# Patient Record
Sex: Male | Born: 1950 | Race: White | Hispanic: No | Smoking: Former smoker
Health system: Southern US, Community
[De-identification: ages and names within clinical notes are randomized; demographics above are authoritative.]

## PROBLEM LIST (undated history)

## (undated) DIAGNOSIS — I4510 Unspecified right bundle-branch block: Secondary | ICD-10-CM

## (undated) DIAGNOSIS — Z951 Presence of aortocoronary bypass graft: Secondary | ICD-10-CM

## (undated) DIAGNOSIS — K449 Diaphragmatic hernia without obstruction or gangrene: Secondary | ICD-10-CM

## (undated) DIAGNOSIS — E119 Type 2 diabetes mellitus without complications: Secondary | ICD-10-CM

## (undated) DIAGNOSIS — E785 Hyperlipidemia, unspecified: Secondary | ICD-10-CM

## (undated) DIAGNOSIS — I5021 Acute systolic (congestive) heart failure: Secondary | ICD-10-CM

## (undated) DIAGNOSIS — R55 Syncope and collapse: Secondary | ICD-10-CM

## (undated) DIAGNOSIS — I480 Paroxysmal atrial fibrillation: Secondary | ICD-10-CM

## (undated) DIAGNOSIS — I2510 Atherosclerotic heart disease of native coronary artery without angina pectoris: Secondary | ICD-10-CM

## (undated) DIAGNOSIS — I10 Essential (primary) hypertension: Secondary | ICD-10-CM

## (undated) HISTORY — DX: Acute systolic (congestive) heart failure: I50.21

## (undated) HISTORY — PX: VASECTOMY: SHX75

## (undated) HISTORY — DX: Type 2 diabetes mellitus without complications: E11.9

## (undated) HISTORY — PX: CARPAL TUNNEL RELEASE: SHX101

## (undated) HISTORY — DX: Syncope and collapse: R55

## (undated) HISTORY — DX: Unspecified right bundle-branch block: I45.10

## (undated) HISTORY — DX: Diaphragmatic hernia without obstruction or gangrene: K44.9

## (undated) HISTORY — DX: Presence of aortocoronary bypass graft: Z95.1

## (undated) HISTORY — DX: Atherosclerotic heart disease of native coronary artery without angina pectoris: I25.10

## (undated) HISTORY — PX: CARDIAC CATHETERIZATION: SHX172

## (undated) HISTORY — DX: Hyperlipidemia, unspecified: E78.5

## (undated) HISTORY — DX: Paroxysmal atrial fibrillation: I48.0

---

## 2010-12-08 ENCOUNTER — Encounter (HOSPITAL_BASED_OUTPATIENT_CLINIC_OR_DEPARTMENT_OTHER)
Admission: RE | Admit: 2010-12-08 | Discharge: 2010-12-08 | Disposition: A | Discharge status: Home / Self Care | Payer: BC Managed Care – PPO | Source: Ambulatory Visit | Attending: Orthopedic Surgery | Admitting: Orthopedic Surgery

## 2010-12-08 LAB — BASIC METABOLIC PANEL
BUN: 10 mg/dL (ref 6–23)
CO2: 24 mEq/L (ref 19–32)
Calcium: 10.2 mg/dL (ref 8.4–10.5)
GFR calc non Af Amer: >90 mL/min (ref >90)
Glucose, Bld: 191 mg/dL — ABNORMAL HIGH (ref 70–99)

## 2010-12-12 ENCOUNTER — Ambulatory Visit (HOSPITAL_BASED_OUTPATIENT_CLINIC_OR_DEPARTMENT_OTHER)
Admission: RE | Admit: 2010-12-12 | Discharge: 2010-12-12 | Disposition: A | Discharge status: Home / Self Care | Payer: BC Managed Care – PPO | Source: Ambulatory Visit | Attending: Orthopedic Surgery | Admitting: Orthopedic Surgery

## 2010-12-12 DIAGNOSIS — G5600 Carpal tunnel syndrome, unspecified upper limb: Secondary | ICD-10-CM | POA: Insufficient documentation

## 2010-12-12 DIAGNOSIS — I10 Essential (primary) hypertension: Secondary | ICD-10-CM | POA: Insufficient documentation

## 2010-12-12 DIAGNOSIS — G5620 Lesion of ulnar nerve, unspecified upper limb: Secondary | ICD-10-CM | POA: Insufficient documentation

## 2010-12-12 DIAGNOSIS — E119 Type 2 diabetes mellitus without complications: Secondary | ICD-10-CM | POA: Insufficient documentation

## 2010-12-12 DIAGNOSIS — Z01812 Encounter for preprocedural laboratory examination: Secondary | ICD-10-CM | POA: Insufficient documentation

## 2010-12-12 LAB — POCT HEMOGLOBIN-HEMACUE: Hemoglobin: 13.7 g/dL (ref 13.0–17.0)

## 2010-12-22 NOTE — Op Note (Signed)
NAME:  Nicholas Mills, DEUTSCHMAN NO.:  [id number]  MEDICAL RECORD NO.:  [id number]  LOCATION:                                 FACILITY:  PHYSICIAN:  Cindee Salt, M.D.            DATE OF BIRTH:  DATE OF PROCEDURE:  12/12/2010 DATE OF DISCHARGE:                              OPERATIVE REPORT   PREOPERATIVE DIAGNOSIS:  Carpal tunnel syndrome, left hand; cubital tunnel syndrome, left elbow.  POSTOPERATIVE DIAGNOSIS:  Carpal tunnel syndrome, left hand; cubital tunnel syndrome, left elbow.  OPERATION:  Decompression median nerve at the left wrist, decompression ulnar nerve, left elbow.  SURGEON:  Cindee Salt, MD  ASSISTANT:  Betha Loa, MD  ANESTHESIA:  General.  ANESTHESIOLOGIST:  Burna Forts, MD  HISTORY:  The patient is a 60 year old male with a history of carpal tunnel syndrome, EMG nerve conductions positive along with cubital tunnel syndrome.  The nerve conductions also positive.  He was elected to undergo surgical decompression.  Pre, peri, and postoperative course have been discussed along with risks and complications.  He is aware that there is no guarantee with the surgery, possibility of infection, recurrence, injury to arteries, nerves, tendons, incomplete relief of symptoms, dystrophy.  In the preoperative area, the patient was seen, the extremity marked by both the patient and surgeon.  Antibiotic given.  PROCEDURE IN DETAIL:  The patient was brought to the operating room where a general anesthetic was carried out without difficulty.  He was prepped using ChloraPrep, supine position with the left arm free.  A 3- minute dry time was allowed.  Time-out taken, confirming the patient procedure.  Following adequate anesthesia, a longitudinal incision was made in the palm, carried down through subcutaneous tissue.  Bleeders were electrocauterized.  Palmar fascia was split.  Superficial palmar arch identified.  The flexor tendon to the ring little finger  identified to the ulnar side of the median nerve.  The carpal retinaculum was incised with sharp dissection.  A right-angle and Sewall retractor were placed between skin and forearm fascia.  The fascia released for approximately a cm and half proximal to the wrist crease under direct vision.  Canal was explored.  No further lesions were identified.  The wound was irrigated and closed with interrupted 4-0 Vicryl repeat sutures and separate incision was then made longitudinally over the medial epicondyle approximately 2.5 cm in length carried down through subcutaneous tissue.  Sensory nerves were identified and protected. Retractors placed.  Osborne fascia was incised in the posterior aspect. This allowed the flap to be rotated anteriorly.  A subcutaneous tissue was dissected free from the flexor carpi ulnaris.  A fasciotomy performed after placement of 2 knee retractors.  This the muscle was then split.  The deep fascia was noted.  A KMI guide for carpal tunnel release was then passed between the nerve.  The retractors re-positioned opening the muscle.  A fasciotomy deep was then performed with angled ENT scissors for approximately 6 cm.  No further lesions were identified.  The nerve was noted to be intact.  A dissection was then carried proximally.  The subcutaneous tissue dissected free from the  underlying fascia.  The nerve was identified.  The retractor was placed and the proximal fasciotomy performed to the arcade of Str[place].  The elbow was fully flexed, no subluxation to the nerve was noted.  The wound was copiously irrigated with saline.  The Osborne fascia was then repaired to the posterior skin flap with figure-of-eight 4-0 Vicryl sutures.  Subcutaneous tissue was closed with interrupted 4-0 Vicryl. The skin was then closed with a subcuticular 4-0 Vicryl repeat suture, this was done in a subcuticular manner.  The wounds were each irrigated with 0.25% Marcaine approximately 8 mL  was used.  A sterile compressive dressing, long-arm splint with the elbow flexed approximately 30 degrees was then applied.  The patient tolerated the procedure well.  On deflation of the tourniquet, all fingers immediately pinked.  He was taken to the recovery room for observation in satisfactory condition. He will be discharged home to return to the [facility] in 1 week on Vicodin.          ______________________________ Cindee Salt, M.D.     GK/MEDQ  D:  12/12/2010  T:  12/12/2010  Job:  [id number]  Electronically Signed by Cindee Salt M.D. on 12/22/2010 03:46:32 PM

## 2015-03-24 DIAGNOSIS — E119 Type 2 diabetes mellitus without complications: Secondary | ICD-10-CM | POA: Diagnosis not present

## 2015-03-24 DIAGNOSIS — Z125 Encounter for screening for malignant neoplasm of prostate: Secondary | ICD-10-CM | POA: Diagnosis not present

## 2015-03-24 DIAGNOSIS — E782 Mixed hyperlipidemia: Secondary | ICD-10-CM | POA: Diagnosis not present

## 2015-03-24 DIAGNOSIS — Z79899 Other long term (current) drug therapy: Secondary | ICD-10-CM | POA: Diagnosis not present

## 2015-03-28 DIAGNOSIS — E1165 Type 2 diabetes mellitus with hyperglycemia: Secondary | ICD-10-CM | POA: Diagnosis not present

## 2015-03-28 DIAGNOSIS — Z760 Encounter for issue of repeat prescription: Secondary | ICD-10-CM | POA: Diagnosis not present

## 2015-06-28 DIAGNOSIS — E1165 Type 2 diabetes mellitus with hyperglycemia: Secondary | ICD-10-CM | POA: Diagnosis not present

## 2015-07-06 DIAGNOSIS — R0989 Other specified symptoms and signs involving the circulatory and respiratory systems: Secondary | ICD-10-CM | POA: Diagnosis not present

## 2015-07-06 DIAGNOSIS — E1165 Type 2 diabetes mellitus with hyperglycemia: Secondary | ICD-10-CM | POA: Diagnosis not present

## 2015-07-06 DIAGNOSIS — Z0000 Encounter for general adult medical examination without abnormal findings: Secondary | ICD-10-CM | POA: Diagnosis not present

## 2015-10-10 DIAGNOSIS — Z9181 History of falling: Secondary | ICD-10-CM | POA: Diagnosis not present

## 2015-10-10 DIAGNOSIS — E1165 Type 2 diabetes mellitus with hyperglycemia: Secondary | ICD-10-CM | POA: Diagnosis not present

## 2015-10-10 DIAGNOSIS — Z1389 Encounter for screening for other disorder: Secondary | ICD-10-CM | POA: Diagnosis not present

## 2015-10-10 DIAGNOSIS — Z0000 Encounter for general adult medical examination without abnormal findings: Secondary | ICD-10-CM | POA: Diagnosis not present

## 2016-04-10 DIAGNOSIS — E1165 Type 2 diabetes mellitus with hyperglycemia: Secondary | ICD-10-CM | POA: Diagnosis not present

## 2016-04-11 DIAGNOSIS — E119 Type 2 diabetes mellitus without complications: Secondary | ICD-10-CM | POA: Diagnosis not present

## 2016-04-17 DIAGNOSIS — N529 Male erectile dysfunction, unspecified: Secondary | ICD-10-CM | POA: Diagnosis not present

## 2016-06-04 DIAGNOSIS — Z6824 Body mass index (BMI) 24.0-24.9, adult: Secondary | ICD-10-CM | POA: Diagnosis not present

## 2016-06-04 DIAGNOSIS — R51 Headache: Secondary | ICD-10-CM | POA: Diagnosis not present

## 2016-06-04 DIAGNOSIS — W57XXXA Bitten or stung by nonvenomous insect and other nonvenomous arthropods, initial encounter: Secondary | ICD-10-CM | POA: Diagnosis not present

## 2016-07-30 DIAGNOSIS — A938 Other specified arthropod-borne viral fevers: Secondary | ICD-10-CM | POA: Diagnosis not present

## 2017-01-09 DIAGNOSIS — E119 Type 2 diabetes mellitus without complications: Secondary | ICD-10-CM | POA: Diagnosis not present

## 2017-01-16 DIAGNOSIS — G4700 Insomnia, unspecified: Secondary | ICD-10-CM | POA: Diagnosis not present

## 2017-01-16 DIAGNOSIS — Z0000 Encounter for general adult medical examination without abnormal findings: Secondary | ICD-10-CM | POA: Diagnosis not present

## 2017-01-16 DIAGNOSIS — E1165 Type 2 diabetes mellitus with hyperglycemia: Secondary | ICD-10-CM | POA: Diagnosis not present

## 2017-01-16 DIAGNOSIS — Z23 Encounter for immunization: Secondary | ICD-10-CM | POA: Diagnosis not present

## 2017-04-16 DIAGNOSIS — Z79899 Other long term (current) drug therapy: Secondary | ICD-10-CM | POA: Diagnosis not present

## 2017-04-16 DIAGNOSIS — J449 Chronic obstructive pulmonary disease, unspecified: Secondary | ICD-10-CM | POA: Diagnosis not present

## 2017-04-16 DIAGNOSIS — E1165 Type 2 diabetes mellitus with hyperglycemia: Secondary | ICD-10-CM | POA: Diagnosis not present

## 2017-04-16 DIAGNOSIS — Z1331 Encounter for screening for depression: Secondary | ICD-10-CM | POA: Diagnosis not present

## 2017-04-16 DIAGNOSIS — Z125 Encounter for screening for malignant neoplasm of prostate: Secondary | ICD-10-CM | POA: Diagnosis not present

## 2017-04-16 DIAGNOSIS — R252 Cramp and spasm: Secondary | ICD-10-CM | POA: Diagnosis not present

## 2017-04-22 DIAGNOSIS — E119 Type 2 diabetes mellitus without complications: Secondary | ICD-10-CM | POA: Diagnosis not present

## 2017-10-25 DIAGNOSIS — Z79899 Other long term (current) drug therapy: Secondary | ICD-10-CM | POA: Diagnosis not present

## 2017-10-25 DIAGNOSIS — E1165 Type 2 diabetes mellitus with hyperglycemia: Secondary | ICD-10-CM | POA: Diagnosis not present

## 2017-10-25 DIAGNOSIS — Z9181 History of falling: Secondary | ICD-10-CM | POA: Diagnosis not present

## 2017-10-25 DIAGNOSIS — Z1339 Encounter for screening examination for other mental health and behavioral disorders: Secondary | ICD-10-CM | POA: Diagnosis not present

## 2018-01-01 DIAGNOSIS — Z23 Encounter for immunization: Secondary | ICD-10-CM | POA: Diagnosis not present

## 2018-07-02 ENCOUNTER — Encounter: Payer: Self-pay | Admitting: Cardiology

## 2018-07-02 DIAGNOSIS — Z0000 Encounter for general adult medical examination without abnormal findings: Secondary | ICD-10-CM | POA: Diagnosis not present

## 2018-07-02 DIAGNOSIS — R55 Syncope and collapse: Secondary | ICD-10-CM | POA: Diagnosis not present

## 2018-07-02 DIAGNOSIS — E871 Hypo-osmolality and hyponatremia: Secondary | ICD-10-CM | POA: Diagnosis not present

## 2018-07-02 DIAGNOSIS — Z79899 Other long term (current) drug therapy: Secondary | ICD-10-CM | POA: Diagnosis not present

## 2018-07-02 DIAGNOSIS — E785 Hyperlipidemia, unspecified: Secondary | ICD-10-CM | POA: Diagnosis not present

## 2018-07-02 DIAGNOSIS — E1169 Type 2 diabetes mellitus with other specified complication: Secondary | ICD-10-CM | POA: Diagnosis not present

## 2018-07-10 ENCOUNTER — Encounter: Payer: Self-pay | Admitting: Cardiology

## 2018-07-10 DIAGNOSIS — R55 Syncope and collapse: Secondary | ICD-10-CM | POA: Diagnosis not present

## 2018-07-10 DIAGNOSIS — I2129 ST elevation (STEMI) myocardial infarction involving other sites: Secondary | ICD-10-CM | POA: Diagnosis not present

## 2018-07-10 DIAGNOSIS — I4510 Unspecified right bundle-branch block: Secondary | ICD-10-CM | POA: Diagnosis not present

## 2018-07-10 DIAGNOSIS — I21A1 Myocardial infarction type 2: Secondary | ICD-10-CM | POA: Diagnosis not present

## 2018-07-11 ENCOUNTER — Other Ambulatory Visit: Payer: Self-pay

## 2018-07-11 ENCOUNTER — Ambulatory Visit (INDEPENDENT_AMBULATORY_CARE_PROVIDER_SITE_OTHER): Payer: Medicare Other | Admitting: Cardiology

## 2018-07-11 ENCOUNTER — Encounter: Payer: Medicare Other | Admitting: Cardiology

## 2018-07-11 ENCOUNTER — Telehealth: Payer: Self-pay | Admitting: Family

## 2018-07-11 ENCOUNTER — Ambulatory Visit: Payer: BC Managed Care – PPO | Admitting: Cardiology

## 2018-07-11 ENCOUNTER — Emergency Department (HOSPITAL_COMMUNITY): Payer: Medicare Other

## 2018-07-11 ENCOUNTER — Encounter: Payer: Self-pay | Admitting: Cardiology

## 2018-07-11 ENCOUNTER — Encounter (HOSPITAL_COMMUNITY): Payer: Self-pay | Admitting: Emergency Medicine

## 2018-07-11 ENCOUNTER — Inpatient Hospital Stay (HOSPITAL_COMMUNITY)
Admission: EM | Admit: 2018-07-11 | Discharge: 2018-07-23 | DRG: 233 | Disposition: A | Discharge status: Home / Self Care | Payer: Medicare Other | Attending: Surgery | Admitting: Surgery

## 2018-07-11 VITALS — BP 110/62 | HR 90 | Temp 98.7°F | Ht 70.0 in | Wt 171.1 lb

## 2018-07-11 DIAGNOSIS — I452 Bifascicular block: Secondary | ICD-10-CM | POA: Diagnosis present

## 2018-07-11 DIAGNOSIS — Z1159 Encounter for screening for other viral diseases: Secondary | ICD-10-CM

## 2018-07-11 DIAGNOSIS — R9439 Abnormal result of other cardiovascular function study: Secondary | ICD-10-CM | POA: Insufficient documentation

## 2018-07-11 DIAGNOSIS — J90 Pleural effusion, not elsewhere classified: Secondary | ICD-10-CM | POA: Diagnosis not present

## 2018-07-11 DIAGNOSIS — R55 Syncope and collapse: Secondary | ICD-10-CM

## 2018-07-11 DIAGNOSIS — I4891 Unspecified atrial fibrillation: Secondary | ICD-10-CM | POA: Diagnosis not present

## 2018-07-11 DIAGNOSIS — Z79899 Other long term (current) drug therapy: Secondary | ICD-10-CM

## 2018-07-11 DIAGNOSIS — I252 Old myocardial infarction: Secondary | ICD-10-CM | POA: Diagnosis not present

## 2018-07-11 DIAGNOSIS — K219 Gastro-esophageal reflux disease without esophagitis: Secondary | ICD-10-CM | POA: Diagnosis present

## 2018-07-11 DIAGNOSIS — Z8249 Family history of ischemic heart disease and other diseases of the circulatory system: Secondary | ICD-10-CM

## 2018-07-11 DIAGNOSIS — L03116 Cellulitis of left lower limb: Secondary | ICD-10-CM | POA: Diagnosis present

## 2018-07-11 DIAGNOSIS — I214 Non-ST elevation (NSTEMI) myocardial infarction: Secondary | ICD-10-CM

## 2018-07-11 DIAGNOSIS — I4510 Unspecified right bundle-branch block: Secondary | ICD-10-CM

## 2018-07-11 DIAGNOSIS — R079 Chest pain, unspecified: Secondary | ICD-10-CM | POA: Diagnosis not present

## 2018-07-11 DIAGNOSIS — N181 Chronic kidney disease, stage 1: Secondary | ICD-10-CM | POA: Diagnosis present

## 2018-07-11 DIAGNOSIS — I272 Pulmonary hypertension, unspecified: Secondary | ICD-10-CM | POA: Diagnosis present

## 2018-07-11 DIAGNOSIS — E1169 Type 2 diabetes mellitus with other specified complication: Secondary | ICD-10-CM | POA: Diagnosis present

## 2018-07-11 DIAGNOSIS — I5021 Acute systolic (congestive) heart failure: Secondary | ICD-10-CM | POA: Diagnosis present

## 2018-07-11 DIAGNOSIS — I340 Nonrheumatic mitral (valve) insufficiency: Secondary | ICD-10-CM | POA: Diagnosis not present

## 2018-07-11 DIAGNOSIS — Z7982 Long term (current) use of aspirin: Secondary | ICD-10-CM | POA: Diagnosis not present

## 2018-07-11 DIAGNOSIS — I25110 Atherosclerotic heart disease of native coronary artery with unstable angina pectoris: Secondary | ICD-10-CM | POA: Diagnosis not present

## 2018-07-11 DIAGNOSIS — I509 Heart failure, unspecified: Secondary | ICD-10-CM | POA: Diagnosis not present

## 2018-07-11 DIAGNOSIS — D62 Acute posthemorrhagic anemia: Secondary | ICD-10-CM | POA: Diagnosis not present

## 2018-07-11 DIAGNOSIS — I429 Cardiomyopathy, unspecified: Secondary | ICD-10-CM

## 2018-07-11 DIAGNOSIS — Z7984 Long term (current) use of oral hypoglycemic drugs: Secondary | ICD-10-CM

## 2018-07-11 DIAGNOSIS — I255 Ischemic cardiomyopathy: Secondary | ICD-10-CM | POA: Diagnosis not present

## 2018-07-11 DIAGNOSIS — Z01810 Encounter for preprocedural cardiovascular examination: Secondary | ICD-10-CM | POA: Diagnosis not present

## 2018-07-11 DIAGNOSIS — Z01812 Encounter for preprocedural laboratory examination: Secondary | ICD-10-CM

## 2018-07-11 DIAGNOSIS — E119 Type 2 diabetes mellitus without complications: Secondary | ICD-10-CM

## 2018-07-11 DIAGNOSIS — E1165 Type 2 diabetes mellitus with hyperglycemia: Secondary | ICD-10-CM | POA: Diagnosis present

## 2018-07-11 DIAGNOSIS — I110 Hypertensive heart disease with heart failure: Secondary | ICD-10-CM | POA: Diagnosis not present

## 2018-07-11 DIAGNOSIS — I2510 Atherosclerotic heart disease of native coronary artery without angina pectoris: Secondary | ICD-10-CM | POA: Diagnosis not present

## 2018-07-11 DIAGNOSIS — I130 Hypertensive heart and chronic kidney disease with heart failure and stage 1 through stage 4 chronic kidney disease, or unspecified chronic kidney disease: Secondary | ICD-10-CM | POA: Diagnosis present

## 2018-07-11 DIAGNOSIS — Z87891 Personal history of nicotine dependence: Secondary | ICD-10-CM

## 2018-07-11 DIAGNOSIS — E1122 Type 2 diabetes mellitus with diabetic chronic kidney disease: Secondary | ICD-10-CM | POA: Diagnosis present

## 2018-07-11 DIAGNOSIS — E785 Hyperlipidemia, unspecified: Secondary | ICD-10-CM

## 2018-07-11 DIAGNOSIS — Z20828 Contact with and (suspected) exposure to other viral communicable diseases: Secondary | ICD-10-CM | POA: Diagnosis not present

## 2018-07-11 DIAGNOSIS — R918 Other nonspecific abnormal finding of lung field: Secondary | ICD-10-CM | POA: Diagnosis not present

## 2018-07-11 DIAGNOSIS — J9811 Atelectasis: Secondary | ICD-10-CM | POA: Diagnosis not present

## 2018-07-11 DIAGNOSIS — Z951 Presence of aortocoronary bypass graft: Secondary | ICD-10-CM

## 2018-07-11 DIAGNOSIS — T1490XA Injury, unspecified, initial encounter: Secondary | ICD-10-CM

## 2018-07-11 DIAGNOSIS — S99922A Unspecified injury of left foot, initial encounter: Secondary | ICD-10-CM | POA: Diagnosis not present

## 2018-07-11 DIAGNOSIS — K449 Diaphragmatic hernia without obstruction or gangrene: Secondary | ICD-10-CM | POA: Diagnosis present

## 2018-07-11 DIAGNOSIS — I517 Cardiomegaly: Secondary | ICD-10-CM | POA: Diagnosis not present

## 2018-07-11 DIAGNOSIS — I361 Nonrheumatic tricuspid (valve) insufficiency: Secondary | ICD-10-CM | POA: Diagnosis not present

## 2018-07-11 HISTORY — DX: Cardiomyopathy, unspecified: I42.9

## 2018-07-11 HISTORY — DX: Abnormal result of other cardiovascular function study: R94.39

## 2018-07-11 HISTORY — DX: Essential (primary) hypertension: I10

## 2018-07-11 HISTORY — DX: Unspecified right bundle-branch block: I45.10

## 2018-07-11 HISTORY — DX: Non-ST elevation (NSTEMI) myocardial infarction: I21.4

## 2018-07-11 HISTORY — DX: Type 2 diabetes mellitus with other specified complication: E11.69

## 2018-07-11 HISTORY — DX: Syncope and collapse: R55

## 2018-07-11 LAB — SARS CORONAVIRUS 2 BY RT PCR (HOSPITAL ORDER, PERFORMED IN ~~LOC~~ HOSPITAL LAB): SARS Coronavirus 2: NEGATIVE

## 2018-07-11 LAB — HEMOGLOBIN A1C
Hgb A1c MFr Bld: 8.1 % — ABNORMAL HIGH (ref 4.8–5.6)
Mean Plasma Glucose: 185.77 mg/dL

## 2018-07-11 LAB — BASIC METABOLIC PANEL
Anion gap: 14 (ref 5–15)
BUN: 27 mg/dL — ABNORMAL HIGH (ref 8–23)
CO2: 23 mmol/L (ref 22–32)
Calcium: 12.2 mg/dL — ABNORMAL HIGH (ref 8.9–10.3)
Chloride: 97 mmol/L — ABNORMAL LOW (ref 98–111)
Creatinine, Ser: 1.48 mg/dL — ABNORMAL HIGH (ref 0.61–1.24)
GFR calc Af Amer: 55 mL/min — ABNORMAL LOW (ref >60)
GFR calc non Af Amer: 48 mL/min — ABNORMAL LOW (ref >60)
Glucose, Bld: 140 mg/dL — ABNORMAL HIGH (ref 70–99)
Potassium: 4.1 mmol/L (ref 3.5–5.1)
Sodium: 134 mmol/L — ABNORMAL LOW (ref 135–145)

## 2018-07-11 LAB — CBC
HCT: 40.4 % (ref 39.0–52.0)
Hemoglobin: 14.2 g/dL (ref 13.0–17.0)
MCH: 33.1 pg (ref 26.0–34.0)
MCHC: 35.1 g/dL (ref 30.0–36.0)
MCV: 94.2 fL (ref 80.0–100.0)
Platelets: 211 10*3/uL (ref 150–400)
RBC: 4.29 MIL/uL (ref 4.22–5.81)
RDW: 12 % (ref 11.5–15.5)
WBC: 7.7 10*3/uL (ref 4.0–10.5)
nRBC: 0 % (ref 0.0–0.2)

## 2018-07-11 LAB — PROTIME-INR
INR: 1.2 (ref 0.8–1.2)
Prothrombin Time: 15.1 seconds (ref 11.4–15.2)

## 2018-07-11 LAB — GLUCOSE, CAPILLARY: Glucose-Capillary: 127 mg/dL — ABNORMAL HIGH (ref 70–99)

## 2018-07-11 LAB — TROPONIN I: Troponin I: 4.44 ng/mL (ref <0.03)

## 2018-07-11 LAB — APTT: aPTT: 77 seconds — ABNORMAL HIGH (ref 24–36)

## 2018-07-11 MED ORDER — HEPARIN (PORCINE) 25000 UT/250ML-% IV SOLN
1400.0000 [IU]/h | INTRAVENOUS | Status: DC
  Administered 2018-07-11: 950 [IU]/h via INTRAVENOUS
  Administered 2018-07-12 – 2018-07-14 (×3): 1300 [IU]/h via INTRAVENOUS
  Filled 2018-07-11 (×4): qty 250

## 2018-07-11 MED ORDER — SODIUM CHLORIDE 0.9% FLUSH
3.0000 mL | Freq: Once | INTRAVENOUS | Status: AC
  Administered 2018-07-11: 3 mL via INTRAVENOUS

## 2018-07-11 MED ORDER — ACETAMINOPHEN 325 MG PO TABS: 650.0000 mg | ORAL_TABLET | ORAL | Status: DC | PRN

## 2018-07-11 MED ORDER — NITROGLYCERIN 0.4 MG SL SUBL: 0.4000 mg | SUBLINGUAL_TABLET | SUBLINGUAL | Status: DC | PRN

## 2018-07-11 MED ORDER — ASPIRIN EC 81 MG PO TBEC: 81.0000 mg | DELAYED_RELEASE_TABLET | Freq: Every day | ORAL | Status: DC

## 2018-07-11 MED ORDER — ASPIRIN EC 81 MG PO TBEC
81.0000 mg | DELAYED_RELEASE_TABLET | Freq: Every day | ORAL | Status: DC
  Administered 2018-07-12 – 2018-07-17 (×5): 81 mg via ORAL
  Filled 2018-07-11 (×5): qty 1

## 2018-07-11 MED ORDER — METOPROLOL TARTRATE 25 MG PO TABS: 25.0000 mg | ORAL_TABLET | Freq: Two times a day (BID) | ORAL | 1 refills | Status: DC

## 2018-07-11 MED ORDER — INSULIN ASPART 100 UNIT/ML ~~LOC~~ SOLN
0.0000 [IU] | Freq: Three times a day (TID) | SUBCUTANEOUS | Status: DC
  Administered 2018-07-12 (×3): 3 [IU] via SUBCUTANEOUS
  Administered 2018-07-13: 8 [IU] via SUBCUTANEOUS
  Administered 2018-07-13: 3 [IU] via SUBCUTANEOUS
  Administered 2018-07-13: 5 [IU] via SUBCUTANEOUS
  Administered 2018-07-14: 11 [IU] via SUBCUTANEOUS
  Administered 2018-07-14: 2 [IU] via SUBCUTANEOUS
  Administered 2018-07-14 – 2018-07-15 (×2): 3 [IU] via SUBCUTANEOUS
  Administered 2018-07-15: 2 [IU] via SUBCUTANEOUS
  Administered 2018-07-15: 8 [IU] via SUBCUTANEOUS
  Administered 2018-07-16: 15 [IU] via SUBCUTANEOUS
  Administered 2018-07-16: 08:00:00 2 [IU] via SUBCUTANEOUS
  Administered 2018-07-16: 8 [IU] via SUBCUTANEOUS
  Administered 2018-07-17: 07:00:00 2 [IU] via SUBCUTANEOUS
  Administered 2018-07-17 (×2): 8 [IU] via SUBCUTANEOUS

## 2018-07-11 MED ORDER — ATORVASTATIN CALCIUM 80 MG PO TABS: 80.0000 mg | ORAL_TABLET | Freq: Every day | ORAL | Status: DC

## 2018-07-11 MED ORDER — METOPROLOL TARTRATE 25 MG PO TABS: 12.5000 mg | ORAL_TABLET | Freq: Two times a day (BID) | ORAL | Status: DC

## 2018-07-11 MED ORDER — INSULIN ASPART 100 UNIT/ML ~~LOC~~ SOLN
0.0000 [IU] | Freq: Every day | SUBCUTANEOUS | Status: DC
  Administered 2018-07-12: 2 [IU] via SUBCUTANEOUS
  Administered 2018-07-14 – 2018-07-17 (×3): 3 [IU] via SUBCUTANEOUS

## 2018-07-11 MED ORDER — HEPARIN BOLUS VIA INFUSION
4000.0000 [IU] | Freq: Once | INTRAVENOUS | Status: AC
  Administered 2018-07-11: 4000 [IU] via INTRAVENOUS
  Filled 2018-07-11: qty 4000

## 2018-07-11 MED ORDER — ONDANSETRON HCL 4 MG/2ML IJ SOLN: 4.0000 mg | Freq: Four times a day (QID) | INTRAMUSCULAR | Status: DC | PRN

## 2018-07-11 MED ORDER — ASPIRIN 81 MG PO CHEW
324.0000 mg | CHEWABLE_TABLET | Freq: Once | ORAL | Status: AC
  Administered 2018-07-11: 324 mg via ORAL
  Filled 2018-07-11: qty 4

## 2018-07-11 MED ORDER — ROSUVASTATIN CALCIUM 5 MG PO TABS
10.0000 mg | ORAL_TABLET | Freq: Every day | ORAL | Status: DC
  Administered 2018-07-12 – 2018-07-23 (×10): 10 mg via ORAL
  Filled 2018-07-11 (×10): qty 2

## 2018-07-11 MED ORDER — METOPROLOL TARTRATE 25 MG PO TABS
25.0000 mg | ORAL_TABLET | Freq: Two times a day (BID) | ORAL | Status: DC
  Administered 2018-07-11 – 2018-07-17 (×12): 25 mg via ORAL
  Filled 2018-07-11 (×12): qty 1

## 2018-07-11 MED ORDER — SODIUM CHLORIDE 0.9 % IV SOLN
Freq: Once | INTRAVENOUS | Status: AC
  Administered 2018-07-11: 1000 mL via INTRAVENOUS

## 2018-07-11 MED ORDER — KETOTIFEN FUMARATE 0.025 % OP SOLN
1.0000 [drp] | Freq: Two times a day (BID) | OPHTHALMIC | Status: DC | PRN
  Filled 2018-07-11: qty 5

## 2018-07-11 MED ORDER — ROSUVASTATIN CALCIUM 10 MG PO TABS: 10.0000 mg | ORAL_TABLET | Freq: Every day | ORAL | 1 refills | Status: DC

## 2018-07-11 MED ORDER — BOOST / RESOURCE BREEZE PO LIQD CUSTOM
1.0000 | Freq: Three times a day (TID) | ORAL | Status: DC
  Administered 2018-07-12 – 2018-07-15 (×9): 1 via ORAL
  Administered 2018-07-16: 22:00:00 via ORAL
  Administered 2018-07-16 – 2018-07-17 (×5): 1 via ORAL

## 2018-07-11 NOTE — ED Provider Notes (Signed)
[facility] EMERGENCY DEPARTMENT Provider Note   CSN: [id number] Arrival date & time: 07/11/18  1835    History   Chief Complaint Chief Complaint  Patient presents with  . Loss of Consciousness    HPI Nicholas Mills is a 68 y.o. male.     HPI   68 year old male with past medical history of hypertension, hyperlipidemia, diabetes, here with elevated troponin.  Patient had a syncopal episode on Monday.  He states that he was walking [place] to his barn when he experienced acute onset of lightheadedness, sweating, then syncopized.  He was on the ground for approximately 50 minutes.  He awoke in the cold pool of sweat.  Since then, he has noticed generalized fatigue and slight worsening of shortness of breath with exertion.  He denies any chest pain during the episode or since the episode.  He was seen at cardiology today and scheduled for heart catheterization on 5/26.  Lab work drawn which showed an elevated troponin and was subsequent told to come to the ER for evaluation.  Troponin was greater than 6.  He said no further chest pain.  Denies any current shortness of breath or sweating.  No nausea.  No leg swelling.  No history of blood clots.  No known history of coronary disease.  Symptoms are worse with exertion.  No alleviating factors.  Past Medical History:  Diagnosis Date  . Diabetes mellitus without complication (HCC)   . Hyperlipidemia   . Hypertension     Patient Active Problem List   Diagnosis Date Noted  . Syncope and collapse 07/11/2018  . Dyslipidemia associated with type 2 diabetes mellitus (HCC) 07/11/2018  . RBBB (right bundle branch block) 07/11/2018    Past Surgical History:  Procedure Laterality Date  . CARPAL TUNNEL RELEASE    . VASECTOMY          Home Medications    Prior to Admission medications   Medication Sig Start Date End Date Taking? Authorizing Provider  aspirin EC 81 MG tablet Take 1 tablet (81 mg total) by mouth daily.  07/11/18   Baldo Daub, MD  Coenzyme Q10 (COQ10) 100 MG CAPS Take 100 mg by mouth at bedtime.     [provider]  ketotifen (ALAWAY) 0.025 % ophthalmic solution Place 1 drop into both eyes 2 (two) times daily as needed (allergy eyes.).    [provider]  lisinopril (ZESTRIL) 20 MG tablet Take 20 mg by mouth at bedtime. 04/24/18   [provider]  metFORMIN (GLUCOPHAGE-XR) 750 MG 24 hr tablet Take 750 mg by mouth 2 (two) times daily.  07/09/18   [provider]  metoprolol tartrate (LOPRESSOR) 25 MG tablet Take 1 tablet (25 mg total) by mouth 2 (two) times daily. 07/11/18 10/09/18  Baldo Daub, MD  psyllium (METAMUCIL) 58.6 % packet Take 1 packet by mouth 2 (two) times a day.    [provider]  Red Yeast Rice 600 MG CAPS Take 600 mg by mouth 2 (two) times a day.     [provider]  rosuvastatin (CRESTOR) 10 MG tablet Take 1 tablet (10 mg total) by mouth daily. 07/11/18 10/09/18  Baldo Daub, MD    Family History Family History  Problem Relation Age of Onset  . CAD Mother   . Valvular heart disease Mother        Valve repair  . Peripheral vascular disease Father     Social History Social History  Tobacco Use  . Smoking status: Former Games developermoker  . Smokeless tobacco: Never Used  Substance Use Topics  . Alcohol use: Not on file    Comment: beer occasionally  . Drug use: Never     Allergies   Patient has no known allergies.   Review of Systems Review of Systems  Constitutional: Positive for fatigue. Negative for chills and fever.  HENT: Negative for congestion and rhinorrhea.   Eyes: Negative for visual disturbance.  Respiratory: Positive for shortness of breath. Negative for cough and wheezing.   Cardiovascular: Negative for chest pain and leg swelling.  Gastrointestinal: Negative for abdominal pain, diarrhea, nausea and vomiting.  Genitourinary: Negative for dysuria and flank pain.  Musculoskeletal: Negative for  neck pain and neck stiffness.  Skin: Negative for rash and wound.  Allergic/Immunologic: Negative for immunocompromised state.  Neurological: Positive for weakness. Negative for syncope and headaches.  All other systems reviewed and are negative.    Physical Exam Updated Vital Signs BP 121/79   Pulse 74   Temp 98.1 F (36.7 C) (Oral)   Resp 16   Ht 5\' 10"  (1.778 m)   Wt 77.6 kg   SpO2 97%   BMI 24.54 kg/m   Physical Exam Vitals signs and nursing note reviewed.  Constitutional:      General: He is not in acute distress.    Appearance: He is well-developed.  HENT:     Head: Normocephalic and atraumatic.  Eyes:     Conjunctiva/sclera: Conjunctivae normal.  Neck:     Musculoskeletal: Neck supple.  Cardiovascular:     Rate and Rhythm: Normal rate and regular rhythm.     Heart sounds: Normal heart sounds. No murmur. No friction rub.  Pulmonary:     Effort: Pulmonary effort is normal. No respiratory distress.     Breath sounds: Normal breath sounds. No wheezing or rales.  Abdominal:     General: There is no distension.     Palpations: Abdomen is soft.     Tenderness: There is no abdominal tenderness.  Skin:    General: Skin is warm.     Capillary Refill: Capillary refill takes less than 2 seconds.  Neurological:     Mental Status: He is alert and oriented to person, place, and time.     Motor: No abnormal muscle tone.      ED Treatments / Results  Labs (all labs ordered are listed, but only abnormal results are displayed) Labs Reviewed  BASIC METABOLIC PANEL - Abnormal; Notable for the following components:      Result Value   Sodium 134 (*)    Chloride 97 (*)    Glucose, Bld 140 (*)    BUN 27 (*)    Creatinine, Ser 1.48 (*)    Calcium 12.2 (*)    GFR calc non Af Amer 48 (*)    GFR calc Af Amer 55 (*)    All other components within normal limits  TROPONIN I - Abnormal; Notable for the following components:   Troponin I 4.44 (*)    All other components  within normal limits  CBC    EKG EKG Interpretation  Date/Time:  Friday Jul 11 2018 19:12:33 EDT Ventricular Rate:  73 PR Interval:  158 QRS Duration: 152 QT Interval:  416 QTC Calculation: 458 R Axis:   125 Text Interpretation:  Normal sinus rhythm Right bundle branch block Left posterior fascicular block T wave abnormality, consider inferior ischemia Abnormal ECG No old tracing to compare  Confirmed by Shaune Pollack [phone]) on 07/11/2018 8:41:43 PM   Radiology No results found.  Procedures .Critical Care Performed by: Shaune Pollack, MD Authorized by: Shaune Pollack, MD   Critical care provider statement:    Critical care time (minutes):  45   Critical care time was exclusive of:  Separately billable procedures and treating other patients and teaching time   Critical care was necessary to treat or prevent imminent or life-threatening deterioration of the following conditions:  Cardiac failure and circulatory failure   Critical care was time spent personally by me on the following activities:  Development of treatment plan with patient or surrogate, discussions with consultants, evaluation of patient's response to treatment, examination of patient, obtaining history from patient or surrogate, ordering and performing treatments and interventions, ordering and review of laboratory studies, ordering and review of radiographic studies, pulse oximetry, re-evaluation of patient's condition and review of old charts   I assumed direction of critical care for this patient from another provider in my specialty: no     (including critical care time)  Medications Ordered in ED Medications  sodium chloride flush (NS) 0.9 % injection 3 mL (has no administration in time range)  aspirin chewable tablet 324 mg (has no administration in time range)  0.9 %  sodium chloride infusion (has no administration in time range)     Initial Impression / Assessment and Plan / ED Course  I have  reviewed the triage vital signs and the nursing notes.  Pertinent labs & imaging results that were available during my care of the patient were reviewed by me and considered in my medical decision making (see chart for details).        68 yo M here with high risk syncope earlier this week, now positive troponin at Cardiology. No CP, no SOB, no diaphoresis. EKG c/f diffuse ischemic changes, no STEMI. Trop 4.44 here. Mild AKI noted. Will start fluids, heparin, ASA, admit.  Final Clinical Impressions(s) / ED Diagnoses   Final diagnoses:  NSTEMI (non-ST elevated myocardial infarction) [facility])    ED Discharge Orders    None       Shaune Pollack, MD 07/11/18 2043

## 2018-07-11 NOTE — Patient Instructions (Addendum)
Medication Instructions:  Your physician has recommended you make the following change in your medication: Start: Metoprolol 25 mg (1 tab) twice daily Start: Aspirin 81 mg (1 Tab) daily Start : Rosuvastatin 10mg  (1 tab) daily   If you need a refill on your cardiac medications before your next appointment, please call your pharmacy.   Lab work: Your physician recommends that you return for lab work in: TODAY BMP,CBC,INR, Troponin  If you have labs (blood work) drawn today and your tests are completely normal, you will receive your results only by: Marland Kitchen. MyChart Message (if you have MyChart) OR . A paper copy in the mail If you have any lab test that is abnormal or we need to change your treatment, we will call you to review the results.  Testing/Procedures:    [facility] [place] [address], [address] [place] [address] Dept: [phone] Loc: [phone]  Micah FlesherJohn Duan  07/11/2018  You are scheduled for a Cardiac Catheterization on Tuesday, May 26 with Dr. Lance MussJayadeep Varanasi.  1. Please [facility] at [facility]: [address] [place], [place] [address] at 07:30  (This time is three hours before your procedure to ensure your preparation). Free valet parking service is available.   Special note: Every effort is made to have your procedure done on time. Please understand that emergencies sometimes delay scheduled procedures.  2. Diet: Do not eat solid foods after midnight.  The patient may have clear liquids until 5am upon the day of the procedure.  3. Labs: DONE TODAY  4. Medication instructions in preparation for your procedure:    Contrast Allergy: No  Do not take Diabetes Med Glucophage (Metformin) on the day of the procedure and HOLD 48 HOURS AFTER THE PROCEDURE.  On the morning of your procedure,  Take any morning medicines NOT listed above.  You may  use sips of water.  5. Plan for one night stay--bring personal belongings. 6. Bring a current list of your medications and current insurance cards. 7. You MUST have a responsible person to drive you home. 8. Someone MUST be with you the first 24 hours after you arrive home or your discharge will be delayed. 9. Please wear clothes that are easy to get on and off and wear slip-on shoes.  Thank you for allowing us to care for you!   -- [facility]  Follow-Up: At [facility], you and your health needs are our priority.  As part of our continuing mission to provide you with exceptional heart care, we have created designated Provider Care Teams.  These Care Teams include your primary Cardiologist (physician) and Advanced Practice Providers (APPs -  Physician Assistants and Nurse Practitioners) who all work together to provide you with the care you need, when you need it. You will need a follow up appointment in 2 weeks.  Any Other Special Instructions Will Be Listed Below (If Applicable).  Metoprolol tablets What is this medicine? METOPROLOL (me TOE proe lole) is a beta-blocker. Beta-blockers reduce the workload on the heart and help it to beat more regularly. This medicine is used to treat high blood pressure and to prevent chest pain. It is also used to after a heart attack and to prevent an additional heart attack from occurring. This medicine may be used for other purposes; ask your health care provider or pharmacist if you have questions. COMMON BRAND  NAME(S): Lopressor What should I tell my health care provider before I take this medicine? They need to know if you have any of these conditions: -diabetes -heart or vessel disease like slow heart rate, worsening heart failure, heart block, sick sinus syndrome or Raynaud's disease -kidney disease -liver disease -lung or breathing disease, like asthma or emphysema -pheochromocytoma -thyroid disease -an  unusual or allergic reaction to metoprolol, other beta-blockers, medicines, foods, dyes, or preservatives -pregnant or trying to get pregnant -breast-feeding How should I use this medicine? Take this medicine by mouth with a drink of water. Follow the directions on the prescription label. Take this medicine immediately after meals. Take your doses at regular intervals. Do not take more medicine than directed. Do not stop taking this medicine suddenly. This could lead to serious heart-related effects. Talk to your pediatrician regarding the use of this medicine in children. Special care may be needed. Overdosage: If you think you have taken too much of this medicine contact a poison control center or emergency room at once. NOTE: This medicine is only for you. Do not share this medicine with others. What if I miss a dose? If you miss a dose, take it as soon as you can. If it is almost time for your next dose, take only that dose. Do not take double or extra doses. What may interact with this medicine? This medicine may interact with the following medications: -certain medicines for blood pressure, heart disease, irregular heart beat -certain medicines for depression like monoamine oxidase (MAO) inhibitors, fluoxetine, or paroxetine -clonidine -dobutamine -epinephrine -isoproterenol -reserpine This list may not describe all possible interactions. Give your health care provider a list of all the medicines, herbs, non-prescription drugs, or dietary supplements you use. Also tell them if you smoke, drink alcohol, or use illegal drugs. Some items may interact with your medicine. What should I watch for while using this medicine? Visit your doctor or health care professional for regular check ups. Contact your doctor right away if your symptoms worsen. Check your blood pressure and pulse rate regularly. Ask your health care professional what your blood pressure and pulse rate should be, and when you  should contact them. You may get drowsy or dizzy. Do not drive, use machinery, or do anything that needs mental alertness until you know how this medicine affects you. Do not sit or stand up quickly, especially if you are an older patient. This reduces the risk of dizzy or fainting spells. Contact your doctor if these symptoms continue. Alcohol may interfere with the effect of this medicine. Avoid alcoholic drinks. What side effects may I notice from receiving this medicine? Side effects that you should report to your doctor or health care professional as soon as possible: -allergic reactions like skin rash, itching or hives -cold or numb hands or feet -depression -difficulty breathing -faint -fever with sore throat -irregular heartbeat, chest pain -rapid weight gain -swollen legs or ankles Side effects that usually do not require medical attention (report to your doctor or health care professional if they continue or are bothersome): -anxiety or nervousness -change in sex drive or performance -dry skin -headache -nightmares or trouble sleeping -short term memory loss -stomach upset or diarrhea -unusually tired This list may not describe all possible side effects. Call your doctor for medical advice about side effects. You may report side effects to FDA at 1-800-FDA-1088. Where should I keep my medicine? Keep out of the reach of children. Store at room temperature between 15 and 30  degrees C (59 and 86 degrees F). Throw away any unused medicine after the expiration date. NOTE: This sheet is a summary. It may not cover all possible information. If you have questions about this medicine, talk to your doctor, pharmacist, or health care provider.  2019 Elsevier/Gold Standard (2012-10-10 14:40:36) Rosuvastatin Tablets What is this medicine? ROSUVASTATIN (roe SOO va sta tin) is known as a HMG-CoA reductase inhibitor or 'statin'. It lowers cholesterol and triglycerides in the blood. This drug  may also reduce the risk of heart attack, stroke, or other health problems in patients with risk factors for heart disease. Diet and lifestyle changes are often used with this drug. This medicine may be used for other purposes; ask your health care provider or pharmacist if you have questions. COMMON BRAND NAME(S): Crestor What should I tell my health care provider before I take this medicine? They need to know if you have any of these conditions: -diabetes -if you often drink alcohol -history of stroke -kidney disease -liver disease -muscle aches or weakness -thyroid disease -an unusual or allergic reaction to rosuvastatin, other medicines, foods, dyes, or preservatives -pregnant or trying to get pregnant -breast-feeding How should I use this medicine? Take this medicine by mouth with a glass of water. Follow the directions on the prescription label. Do not cut, crush or chew this medicine. You can take this medicine with or without food. Take your doses at regular intervals. Do not take your medicine more often than directed. Talk to your pediatrician regarding the use of this medicine in children. While this drug may be prescribed for children as young as 60 years old for selected conditions, precautions do apply. Overdosage: If you think you have taken too much of this medicine contact a poison control center or emergency room at once. NOTE: This medicine is only for you. Do not share this medicine with others. What if I miss a dose? If you miss a dose, take it as soon as you can. If your next dose is to be taken in less than 12 hours, then do not take the missed dose. Take the next dose at your regular time. Do not take double or extra doses. What may interact with this medicine? Do not take this medicine with any of the following medications: -herbal medicines like red yeast rice This medicine may also interact with the following medications: -alcohol -antacids containing aluminum  hydroxide or magnesium hydroxide -cyclosporine -other medicines for high cholesterol -some medicines for HIV infection -warfarin This list may not describe all possible interactions. Give your health care provider a list of all the medicines, herbs, non-prescription drugs, or dietary supplements you use. Also tell them if you smoke, drink alcohol, or use illegal drugs. Some items may interact with your medicine. What should I watch for while using this medicine? Visit your doctor or health care professional for regular check-ups. You may need regular tests to make sure your liver is working properly. Your health care professional may tell you to stop taking this medicine if you develop muscle problems. If your muscle problems do not go away after stopping this medicine, contact your health care professional. Do not become pregnant while taking this medicine. Women should inform their health care professional if they wish to become pregnant or think they might be pregnant. There is a potential for serious side effects to an unborn child. Talk to your health care professional or pharmacist for more information. Do not breast-feed an infant while taking this medicine.  This medicine may affect blood sugar levels. If you have diabetes, check with your doctor or health care professional before you change your diet or the dose of your diabetic medicine. If you are going to need surgery or other procedure, tell your doctor that you are using this medicine. This drug is only part of a total heart-health program. Your doctor or a dietician can suggest a low-cholesterol and low-fat diet to help. Avoid alcohol and smoking, and keep a proper exercise schedule. This medicine may cause a decrease in Co-Enzyme Q-10. You should make sure that you get enough Co-Enzyme Q-10 while you are taking this medicine. Discuss the foods you eat and the vitamins you take with your health care professional. What side effects may I  notice from receiving this medicine? Side effects that you should report to your doctor or health care professional as soon as possible: -allergic reactions like skin rash, itching or hives, swelling of the face, lips, or tongue -dark urine -fever -joint pain -muscle cramps, pain -redness, blistering, peeling or loosening of the skin, including inside the mouth -trouble passing urine or change in the amount of urine -unusually weak or tired -yellowing of the eyes or skin Side effects that usually do not require medical attention (report to your doctor or health care professional if they continue or are bothersome): -constipation -heartburn -nausea -stomach gas, pain, upset This list may not describe all possible side effects. Call your doctor for medical advice about side effects. You may report side effects to FDA at 1-800-FDA-1088. Where should I keep my medicine? Keep out of the reach of children. Store at room temperature between 20 and 25 degrees C (68 and 77 degrees F). Keep container tightly closed (protect from moisture). Throw away any unused medicine after the expiration date. NOTE: This sheet is a summary. It may not cover all possible information. If you have questions about this medicine, talk to your doctor, pharmacist, or health care provider.  2019 Elsevier/Gold Standard (2016-10-09 12:42:43)

## 2018-07-11 NOTE — ED Notes (Signed)
ED TO INPATIENT HANDOFF REPORT  ED Nurse Name and Phone #:  Kaysie Michelini [phone]  S Name/Age/Gender Nicholas Mills 68 y.o. male Room/Bed: 033C/033C  Code Status   Code Status: Full Code  Home/SNF/Other Home Patient oriented to: self, place, time and situation Is this baseline? Yes   Triage Complete: Triage complete  Chief Complaint Sent from Dr  Triage Note Pt in POV, sent by his cardiologist for elevated Troponin of 6.8 He reports multiple episodes of syncope over the past few days and [facility]. Was scheduled to have heart cath 5/26. Pt denies pain at the present time. VSS.    Allergies No Known Allergies  Level of Care/Admitting Diagnosis ED Disposition    ED Disposition Condition Comment   Admit  [facility] [[id number]]  Level of Care: Progressive [102]  Covid Evaluation: Screening Protocol (No Symptoms)  Diagnosis: NSTEMI (non-ST elevated myocardial infarction) [facility](HCC) [[id number]]) [[id number]]  Admitting Physician: Starlyn SkeansLOWENSTERN, ANGELA [phone][[id number]]  Attending Physician: Lars MassonNELSON, KATARINA H [[id number]]  Estimated length of stay: past midnight tomorrow  Certification:: I certify this patient will need inpatient services for at least 2 midnights  PT Class (Do Not Modify): Inpatient [101]  PT Acc Code (Do Not Modify): Private [1]       B Medical/Surgery History Past Medical History:  Diagnosis Date  . Diabetes mellitus without complication (HCC)   . Hyperlipidemia   . Hypertension    Past Surgical History:  Procedure Laterality Date  . CARPAL TUNNEL RELEASE    . VASECTOMY       A IV Location/Drains/Wounds Patient Lines/Drains/Airways Status   Active Line/Drains/Airways    Name:   Placement date:   Placement time:   Site:   Days:   Peripheral IV 07/11/18 Left;Anterior Antecubital   07/11/18    2051    Antecubital   less than 1   Peripheral IV 07/11/18 Right Forearm   07/11/18    2103    Forearm   less than 1           Intake/Output Last 24 hours No intake or output data in the 24 hours ending 07/11/18 2225  Labs/Imaging Results for orders placed or performed during the hospital encounter of 07/11/18 (from the past 48 hour(s))  Basic metabolic panel     Status: Abnormal   Collection Time: 07/11/18  7:27 PM  Result Value Ref Range   Sodium 134 (L) 135 - 145 mmol/L   Potassium 4.1 3.5 - 5.1 mmol/L   Chloride 97 (L) 98 - 111 mmol/L   CO2 23 22 - 32 mmol/L   Glucose, Bld 140 (H) 70 - 99 mg/dL   BUN 27 (H) 8 - 23 mg/dL   Creatinine, Ser 2.951.48 (H) 0.61 - 1.24 mg/dL   Calcium 62.112.2 (H) 8.9 - 10.3 mg/dL   GFR calc non Af Amer 48 (L) >60 mL/min   GFR calc Af Amer 55 (L) >60 mL/min   Anion gap 14 5 - 15    Comment: Performed at [facility], [address] [place], [place] [address]  CBC     Status: None   Collection Time: 07/11/18  7:27 PM  Result Value Ref Range   WBC 7.7 4.0 - 10.5 K/uL   RBC 4.29 4.22 - 5.81 MIL/uL   Hemoglobin 14.2 13.0 - 17.0 g/dL   HCT 78.440.4 69.639.0 - 29.552.0 %   MCV 94.2 80.0 - 100.0 fL   MCH 33.1 26.0 - 34.0  pg   MCHC 35.1 30.0 - 36.0 g/dL   RDW 16.1 09.6 - 04.5 %   Platelets 211 150 - 400 K/uL   nRBC 0.0 0.0 - 0.2 %    Comment: Performed at [facility], [address] [place], [place] [address]  Troponin I - ONCE - STAT     Status: Abnormal   Collection Time: 07/11/18  7:27 PM  Result Value Ref Range   Troponin I 4.44 (HH) <0.03 ng/mL    Comment: CRITICAL RESULT CALLED TO, READ BACK BY AND VERIFIED WITH: B OSORIO RN AT 2023 ON [id number] BY Marveen Reeks Performed at [facility], [address] [place], [place] [address]    Dg Chest Portable 1 View  Result Date: 07/11/2018 CLINICAL DATA:  Chest pain following multiple syncopal episodes EXAM: PORTABLE CHEST 1 VIEW COMPARISON:  None. FINDINGS: Cardiac shadow is mildly enlarged. Large hiatal hernia is seen. Lungs are well aerated bilaterally with mild likely chronic interstitial changes. No edematous changes  are seen. No effusion is noted. No bony abnormality is noted. IMPRESSION: No active disease. Electronically Signed   By: Alcide Clever M.D.   On: 07/11/2018 21:19    Pending Labs Unresulted Labs (From admission, onward)    Start     Ordered   07/12/18 0500  Heparin level (unfractionated)  Daily,   R     07/11/18 2044   07/12/18 0500  CBC  Daily,   R     07/11/18 2044   07/12/18 0500  Lipid panel  Tomorrow morning,   R     07/11/18 2220   07/12/18 0500  Basic metabolic panel  Tomorrow morning,   R     07/11/18 2220   07/11/18 2204  TSH  Once,   R     07/11/18 2220   07/11/18 2204  T4, free  Once,   R     07/11/18 2220   07/11/18 2204  Troponin I - Now Then Q6H  Now then every 6 hours,   STAT     07/11/18 2220   07/11/18 2204  Hemoglobin A1c  Once,   R     07/11/18 2220   07/11/18 2204  Brain natriuretic peptide  Once,   R     07/11/18 2220   07/11/18 2204  Protime-INR  ONCE - STAT,   STAT     07/11/18 2220   07/11/18 2204  APTT  ONCE - STAT,   STAT     07/11/18 2220   07/11/18 2203  HIV antibody (Routine Testing)  Once,   R     07/11/18 2220   07/11/18 2112  SARS Coronavirus 2 (CEPHEID - Performed in [facility] hospital lab), Hosp Order  (Asymptomatic Patients Labs)  Once,   R    Question:  Rule Out  Answer:  Yes   07/11/18 2111          Vitals/Pain Today's Vitals   07/11/18 2130 07/11/18 2150 07/11/18 2151 07/11/18 2200  BP: 117/85   105/68  Pulse: 70   65  Resp: 14   16  Temp:      TempSrc:      SpO2: 96%   94%  Weight:      Height:      PainSc:  0-No pain 0-No pain     Isolation Precautions No active isolations  Medications Medications  heparin ADULT infusion 100 units/mL (25000 units/267mL sodium chloride 0.45%) (950 Units/hr Intravenous  New Bag/Given 07/11/18 2108)  aspirin EC tablet 81 mg (has no administration in time range)  nitroGLYCERIN (NITROSTAT) SL tablet 0.4 mg (has no administration in time range)  acetaminophen (TYLENOL) tablet 650 mg (has no  administration in time range)  ondansetron (ZOFRAN) injection 4 mg (has no administration in time range)  insulin aspart (novoLOG) injection 0-15 Units (has no administration in time range)  insulin aspart (novoLOG) injection 0-5 Units (has no administration in time range)  metoprolol tartrate (LOPRESSOR) tablet 25 mg (has no administration in time range)  rosuvastatin (CRESTOR) tablet 10 mg (has no administration in time range)  ketotifen (ZADITOR) 0.025 % ophthalmic solution 1 drop (has no administration in time range)  sodium chloride flush (NS) 0.9 % injection 3 mL (3 mLs Intravenous Given 07/11/18 2104)  aspirin chewable tablet 324 mg (324 mg Oral Given 07/11/18 2051)  0.9 %  sodium chloride infusion (1,000 mLs Intravenous New Bag/Given 07/11/18 2054)  heparin bolus via infusion 4,000 Units (4,000 Units Intravenous Bolus from Bag 07/11/18 2107)    Mobility walks Low fall risk   Focused Assessments Cardiac Assessment Handoff:  Cardiac Rhythm: Normal sinus rhythm Lab Results  Component Value Date   TROPONINI 4.44 (HH) 07/11/2018   No results found for: DDIMER Does the Patient currently have chest pain? No     R Recommendations: See Admitting Provider Note  Report given to:   Additional Notes:

## 2018-07-11 NOTE — Progress Notes (Signed)
ANTICOAGULATION CONSULT NOTE - Initial Consult  Pharmacy Consult for heparin Indication: chest pain/ACS  Patient Measurements: Height: 5\' 10"  (177.8 cm) Weight: 171 lb (77.6 kg) IBW/kg (Calculated) : 73 Heparin Dosing Weight: 77 kg  Vital Signs: Temp: 98.1 F (36.7 C) (05/22 2010) Temp Source: Oral (05/22 2010) BP: 121/79 (05/22 2015) Pulse Rate: 74 (05/22 2015)  Labs: Recent Labs    07/11/18 1927  HGB 14.2  HCT 40.4  PLT 211  CREATININE 1.48*  TROPONINI 4.44*    Assessment: Syncopal episode earlier this week. Positive troponin. Starting heparin for rule out ACS. SCr up to 1.48, no baseline SCr since 2012 (normal then). Not on anticoagulation prior to admission.   Goal of Therapy:  Heparin level 0.3-0.7 units/ml Monitor platelets by anticoagulation protocol: Yes    Plan:  -heparin bolus 4000 units x1 then 950 units/hr -daily HL, CBC -First level with AM labs   MastersDarl Mills 07/11/2018,8:35 PM

## 2018-07-11 NOTE — ED Notes (Signed)
Date and time results received: 07/11/18 2024 (use smartphrase ".now" to insert current time)  Test:  Troponin  Critical Value: 4.44  Name of Provider Notified: Erma Heritage

## 2018-07-11 NOTE — H&P (Addendum)
History & Physical    Patient ID: Rodrecus Belsky MRN: [id number], DOB/AGE: [birth year]   Admit date: 07/11/2018   Primary Physician: Lise Auer, MD Primary Cardiologist: No primary care provider on file.  Patient Profile    Mr. Dery is a 68 yom with DMII, HTN, HL who presents for further management of NSTEMI.   Past Medical History    Past Medical History:  Diagnosis Date  . Diabetes mellitus without complication (HCC)   . Hyperlipidemia   . Hypertension     Past Surgical History:  Procedure Laterality Date  . CARPAL TUNNEL RELEASE    . VASECTOMY       Allergies  No Known Allergies  History of Present Illness    Mr. Pablo is a 68 yom with DMII, HTN, HL who presents for further management of NSTEMI.   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 He tried to get to his truck to steady himself but woke up on the ground about an hour later, very weak and diaphoretic. Due to this episode, he was evaluated by his PCP and underwent a stress nuclear test which showed an ejection fraction of 28% with global hypokinesia, small fixe defect in the anterior septal wall and ischemia in the anterior septal region/inferior later wall mid to base. Noted to be a high risk study with extensive multivessel ischemia. He was referred to cardiology and was seen by Dr. Dulce Sellar today who scheduled the patient for cardiac catheterization on 5/26. Blood work was also sent and troponin returned elevated at 6.8 and thus the patient was instructed to present to the ED.  In the ED, the patient denies any active chest pain. He initially denied any recent chest pain but on further discussion does report some mild exertional symptoms that have developed over the past few weeks. Also recently started taking a PPI for discomfort that he attributed to GERD. Reports that he has not had any further  syncopal episodes. Is feeling well at this time. He was given a full dose aspirin, started on heparin gtt and admitted for further management.   Home Medications    Prior to Admission medications   Medication Sig Start Date End Date Taking? Authorizing Provider  aspirin EC 81 MG tablet Take 1 tablet (81 mg total) by mouth daily. 07/11/18   Baldo Daub, MD  Coenzyme Q10 (COQ10) 100 MG CAPS Take 100 mg by mouth at bedtime.     [provider]  ketotifen (ALAWAY) 0.025 % ophthalmic solution Place 1 drop into both eyes 2 (two) times daily as needed (allergy eyes.).    [provider]  lisinopril (ZESTRIL) 20 MG tablet Take 20 mg by mouth at bedtime. 04/24/18   [provider]  metFORMIN (GLUCOPHAGE-XR) 750 MG 24 hr tablet Take 750 mg by mouth 2 (two) times daily.  07/09/18   [provider]  metoprolol tartrate (LOPRESSOR) 25 MG tablet Take 1 tablet (25 mg total) by mouth 2 (two) times daily. 07/11/18 10/09/18  Baldo Daub, MD  psyllium (METAMUCIL) 58.6 % packet Take 1 packet by mouth 2 (two) times a day.    [provider]  Red Yeast Rice 600 MG CAPS Take 600 mg by mouth 2 (two) times a day.     [provider]  rosuvastatin (CRESTOR) 10 MG tablet Take 1 tablet (10 mg total)  by mouth daily. 07/11/18 10/09/18  Baldo Daub, MD    Family History    Family History  Problem Relation Age of Onset  . CAD Mother   . Valvular heart disease Mother        Valve repair  . Peripheral vascular disease Father    He indicated that the status of his mother is unknown. He indicated that the status of his father is unknown.   Social History    Social History   Socioeconomic History  . Marital status: Divorced    Spouse name: Not on file  . Number of children: Not on file  . Years of education: Not on file  . Highest education level: Not on file  Occupational History  . Not on file  Social Needs  . Financial resource strain: Not on file  .  Food insecurity:    Worry: Not on file    Inability: Not on file  . Transportation needs:    Medical: Not on file    Non-medical: Not on file  Tobacco Use  . Smoking status: Former Games developer  . Smokeless tobacco: Never Used  Substance and Sexual Activity  . Alcohol use: Not on file    Comment: beer occasionally  . Drug use: Never  . Sexual activity: Not on file  Lifestyle  . Physical activity:    Days per week: Not on file    Minutes per session: Not on file  . Stress: Not on file  Relationships  . Social connections:    Talks on phone: Not on file    Gets together: Not on file    Attends religious service: Not on file    Active member of club or organization: Not on file    Attends meetings of clubs or organizations: Not on file    Relationship status: Not on file  . Intimate partner violence:    Fear of current or ex partner: Not on file    Emotionally abused: Not on file    Physically abused: Not on file    Forced sexual activity: Not on file  Other Topics Concern  . Not on file  Social History Narrative  . Not on file     Review of Systems    General:  No chills, fever, night sweats or weight changes.  Cardiovascular: As per HPI Dermatological: No rash, lesions/masses Respiratory: No cough, dyspnea Urologic: No hematuria, dysuria Abdominal:   No nausea, vomiting, diarrhea, bright red blood per rectum, melena, or hematemesis Neurologic:  No visual changes, wkns, changes in mental status. All other systems reviewed and are otherwise negative except as noted above.  Physical Exam    Blood pressure 121/79, pulse 74, temperature 98.1 F (36.7 C), temperature source Oral, resp. rate 16, height 5\' 10"  (1.778 m), weight 77.6 kg, SpO2 97 %.  General: Pleasant, NAD Psych: Normal affect. Neuro: Alert and oriented X 3. Moves all extremities spontaneously. HEENT: Normal  Neck: Supple without bruits or JVD. Lungs:  Resp regular and unlabored, CTA. Heart: RRR no s3, s4,  or murmurs. Abdomen: Soft, non-tender, non-distended, BS + x 4.  Extremities: No clubbing, cyanosis or edema. DP/PT/Radials 2+ and equal bilaterally.  Labs    Troponin (Point of Care Test) No results for input(s): TROPIPOC in the last 72 hours. Recent Labs    07/11/18 1927  TROPONINI 4.44*   Lab Results  Component Value Date   WBC 7.7 07/11/2018   HGB 14.2 07/11/2018   HCT 40.4  07/11/2018   MCV 94.2 07/11/2018   PLT 211 07/11/2018    Recent Labs  Lab 07/11/18 1927  NA 134*  K 4.1  CL 97*  CO2 23  BUN 27*  CREATININE 1.48*  CALCIUM 12.2*  GLUCOSE 140*   No results found for: CHOL, HDL, LDLCALC, TRIG No results found for: DDIMER   Troponin 6.8 -> 4.44   Radiology Studies    CXR - no acute cardiopulmonary disease  [facility]10[facility]: Perfusion: Small non reversible perfusion defect at the anterior septal wall at the apex. There is superimposed reversible ischemia at this site on stress images. There is additional moderate-sized non reversible perfusion defect at the inferolateral wall from mid ventricle to base with superimposed reversible ischemia.   Global hypokinesia; EF 28% No ischemic ECG changes during infusion High risk study  ECG & Cardiac Imaging    ECG 07/11/27 - personally reviewed. Sinus rhythm, right bundle branch block, 1mm STE isolated to AVR, ST depression, TWI laterally  Assessment & Plan    Mr. Rollen SoxBrinkley is a [age] yom with DMII, HTN, HL and recent episode of syncope, high risk nuclear stress test who presents for further management of NSTEMI.   # NSTEMI High risk nuclear stress test as above and recent episode of syncope.  - Cont to trend troponin, does appear to be down trending initially which raises question of timing of the patient's event, ?at time of syncope. - Trend ECGs, concern for LM disease based on AVR elevation - heparin gtt - ASA 81mg  daily; received 324 in the ED - Metoprolol 25mg  BID - Rosuvastatin 10mg   daily - TTE in AM - Lipid panel, A1C, TFTs pending - Plan for LHC on Tuesday  # HL - rosuvastatin as above - lipid panel pending  # HTN - Cont home metoprolol as above - Hold home lisinopril in the setting of elevated creatinine  # DMII - A1C pending - hold home metformin while inpatient - Insulin sliding scale, accuchecks  # Renal dysfunction - Unclear chronicity, Cr 1.5 on admission - Cont to monitor, avoid nephrotoxic medications - Hold home lisinopril as above  # FEN - Low fat, low carb diet  # FULL CODE  Signed, Starlyn SkeansAngela Triston Skare, MD 07/11/2018, 8:25 PM

## 2018-07-11 NOTE — Progress Notes (Signed)
Cardiology Office Note:    Date:  07/11/2018   ID:  Nicholas Mills, DOB 1950/08/24, MRN [id number]  PCP:  Lise AuerKhan, Jaber A, MD  Cardiologist:  Norman HerrlichBrian Munley, MD   Referring MD: Lise AuerKhan, Jaber A, MD  ASSESSMENT:    1. Syncope and collapse   2. Abnormal myocardial perfusion study   3. Type 2 diabetes mellitus without complication, without long-term current use of insulin (HCC)   4. Pre-procedure lab exam    PLAN:    I received a phone call at 5:03 PM from [facility] that his troponin is elevated 6.8 he clearly has acute coronary syndrome high risk myocardial perfusion study evidence of severe left ventricular dysfunction and multivessel ischemia and referred directly to the emergency room for inpatient treatment and evaluation.  No he presently is scheduled for coronary angiography as an outpatient Tuesday and was sent to have testing for COVID-19 from the office earlier.  In order of problems listed above:  1. 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  A troponin be drawn today if abnormal he will be admitted to the hospital and treated as acute coronary syndrome.  I discussed the risk benefits and options of coronary angiography I considered the previous procedure urgent and the patient agrees and accepts risk. 2. 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  Next appointment 2 weeks   Medication Adjustments/Labs and Tests Ordered: Current medicines are reviewed at length with the patient today.  Concerns regarding medicines are outlined above.  Orders Placed This Encounter  Procedures  . Troponin I  . CBC   . Basic Metabolic Panel (BMET)  . INR/PT  . EKG 12-Lead   Meds ordered this encounter  Medications  . metoprolol tartrate (LOPRESSOR) 25 MG tablet    Sig: Take 1 tablet (25 mg total) by mouth 2 (two) times daily.    Dispense:  180 tablet    Refill:  1  . rosuvastatin (CRESTOR) 10 MG tablet    Sig: Take 1 tablet (10 mg total) by mouth daily.    Dispense:  90 tablet    Refill:  1  . aspirin EC 81 MG tablet    Sig: Take 1 tablet (81 mg total) by mouth daily.     No chief complaint on file.   History of Present Illness:    Nicholas Mills is a 68 y.o. male who is being seen today for the evaluation of very abnormal myocardial perfusion study at the request of Lise AuerKhan, Jaber A, MD.  I received a chart message last night asking me to work this patient into my office hours with an abnormal myocardial perfusion study and syncope.  I received [facility] he had a Lexiscan Cardiolite study 07/10/2018.  His resting heart rate and blood pressure were normal EKG showed right bundle branch block and repolarization changes his heart rate blood pressure response was normal he had no chest pain and no arrhythmia.  The myocardial perfusion study showed an ejection fraction of 28% with global hypokinesia there is a small fixed defect noted in the anterior septal wall and there is ischemia noted in the anterior  septal region and additional ischemia noted in inferior lateral wall mid to base.  Number this is a high risk study with severe left ventricular dysfunction scar and extensive multivessel ischemia.  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  Because of this he underwent a myocardial perfusion study consistent with severe left ventricular dysfunction and multivessel ischemia.  In retrospect he has had mild exertional chest tightness and shortness of breath has not interfered with his work it has not been frequent but it is occurred in the past.  He has no known history of congenital or rheumatic heart disease.  Generally he does not feel well but he has had no recurrent syncope or chest discomfort in the last 10 days.  With his very abnormal myocardial perfusion study he will undergo coronary angiography and I suspect he will require revascularization he has no dye allergy I feel the procedure is urgent and the options benefits and risks were detailed with the patient he agrees.  I will start him on a beta-blocker discontinue red rice yeast placement statin and continue low-dose aspirin 81 mg daily  Past Medical History:  Diagnosis Date  . Diabetes mellitus without complication (HCC)   . Hyperlipidemia     Past Surgical History:  Procedure Laterality Date  . CARPAL TUNNEL RELEASE    . VASECTOMY      Current Medications: Current Meds  Medication Sig  . aspirin EC 81 MG tablet Take 1 tablet (81 mg total) by mouth daily.  . Coenzyme Q10 (COQ10) 100 MG CAPS Take 100 mg by mouth at bedtime.   Marland Kitchen lisinopril (ZESTRIL) 20 MG tablet Take 20 mg by mouth at bedtime.  . metFORMIN (GLUCOPHAGE-XR) 750 MG 24 hr tablet Take 750 mg by mouth 2 (two) times daily.   . Red Yeast Rice 600 MG CAPS Take 600 mg by mouth 2 (two) times a day.   . [DISCONTINUED] aspirin EC 81 MG tablet Take 325 mg by mouth daily.      Allergies:   Patient has no known allergies.   Social History   Socioeconomic History  . Marital status: Divorced    Spouse name: Not on file  . Number of children: Not on file  . Years of education: Not on file  . Highest education level: Not on file  Occupational History   . Not on file  Social Needs  . Financial resource strain: Not on file  . Food insecurity:    Worry: Not on file    Inability: Not on file  . Transportation needs:    Medical: Not on file    Non-medical: Not on file  Tobacco Use  . Smoking status: Former Games developer  . Smokeless tobacco: Never Used  Substance and Sexual Activity  . Alcohol use: Not on file    Comment: beer occasionally  . Drug use: Never  . Sexual activity: Not on file  Lifestyle  . Physical activity:    Days per week: Not on file    Minutes per  session: Not on file  . Stress: Not on file  Relationships  . Social connections:    Talks on phone: Not on file    Gets together: Not on file    Attends religious service: Not on file    Active member of club or organization: Not on file    Attends meetings of clubs or organizations: Not on file    Relationship status: Not on file  Other Topics Concern  . Not on file  Social History Narrative  . Not on file     Family History: The patient's family history includes CAD in his mother; Peripheral vascular disease in his father; Valvular heart disease in his mother.  ROS:   Review of Systems  Constitution: Positive for malaise/fatigue.  HENT: Negative.   Eyes: Negative.   Cardiovascular: Positive for chest pain, dyspnea on exertion and syncope.  Respiratory: Positive for shortness of breath.   Endocrine: Negative.   Hematologic/Lymphatic: Negative.   Skin: Negative.   Musculoskeletal: Negative.   Gastrointestinal: Negative.   Genitourinary: Negative.   Psychiatric/Behavioral: Negative.   Allergic/Immunologic: Negative.    Please see the history of present illness.     All other systems reviewed and are negative.  EKGs/Labs/Other Studies Reviewed:    The following studies were reviewed today:   EKG:  EKG is  ordered today.  The ekg ordered today is personally reviewed and demonstrates sinus rhythm right bundle branch block nonspecific ST abnormality   Recent Labs: No results found for requested labs within last 8760 hours.  Recent Lipid Panel No results found for: CHOL, TRIG, HDL, CHOLHDL, VLDL, LDLCALC, LDLDIRECT  Physical Exam:    VS:  BP 110/62   Pulse 90   Temp 98.7 F (37.1 C)   Ht 5\' 10"  (1.778 m)   Wt 171 lb 1.9 oz (77.6 kg)   BMI 24.55 kg/m     Wt Readings from Last 3 Encounters:  07/11/18 171 lb 1.9 oz (77.6 kg)     GEN:  Well nourished, well developed in no acute distress HEENT: Normal NECK: No JVD; No carotid bruits LYMPHATICS: No lymphadenopathy CARDIAC: RRR, no murmurs, rubs, gallops RESPIRATORY:  Clear to auscultation without rales, wheezing or rhonchi  ABDOMEN: Soft, non-tender, non-distended MUSCULOSKELETAL:  No edema; No deformity  SKIN: Warm and dry NEUROLOGIC:  Alert and oriented x 3 PSYCHIATRIC:  Normal affect     Signed, Norman Herrlich, MD  07/11/2018 2:37 PM    [facility]

## 2018-07-11 NOTE — Telephone Encounter (Signed)
Dr. Dulce Sellar was called lab results after office visit. Troponin 6.8.  Called patient and instructed him to go to the emergency department at [facility]. Recommended he have a family member or friend be the one to drive him to the hospital urgently. Educated him on visitor precautions due to COVID19 which he was understanding of.   Spoke with on call APP Lizabeth Leyden to make her aware of the patient.   Alver Sorrow, NP

## 2018-07-11 NOTE — Progress Notes (Signed)
[facility] This encounter was created in error - please disregard.

## 2018-07-11 NOTE — ED Triage Notes (Signed)
Pt in POV, sent by his cardiologist for elevated Troponin of 6.8 He reports multiple episodes of syncope over the past few days and was seen at Dr [facility] office. Was scheduled to have heart cath 5/26. Pt denies pain at the present time. VSS.

## 2018-07-12 ENCOUNTER — Inpatient Hospital Stay (HOSPITAL_COMMUNITY): Payer: Medicare Other

## 2018-07-12 DIAGNOSIS — I361 Nonrheumatic tricuspid (valve) insufficiency: Secondary | ICD-10-CM

## 2018-07-12 DIAGNOSIS — I214 Non-ST elevation (NSTEMI) myocardial infarction: Principal | ICD-10-CM

## 2018-07-12 DIAGNOSIS — I340 Nonrheumatic mitral (valve) insufficiency: Secondary | ICD-10-CM

## 2018-07-12 LAB — BASIC METABOLIC PANEL
Anion gap: 12 (ref 5–15)
BUN/Creatinine Ratio: 17 (ref 10–24)
BUN: 26 mg/dL (ref 8–27)
BUN: 26 mg/dL — ABNORMAL HIGH (ref 8–23)
CO2: 25 mmol/L (ref 20–29)
CO2: 25 mmol/L (ref 22–32)
Calcium: 11.7 mg/dL — ABNORMAL HIGH (ref 8.6–10.2)
Calcium: 11.1 mg/dL — ABNORMAL HIGH (ref 8.9–10.3)
Chloride: 98 mmol/L (ref 96–106)
Chloride: 100 mmol/L (ref 98–111)
Creatinine, Ser: 1.53 mg/dL — ABNORMAL HIGH (ref 0.76–1.27)
Creatinine, Ser: 1.49 mg/dL — ABNORMAL HIGH (ref 0.61–1.24)
GFR calc Af Amer: 53 mL/min/{1.73_m2} — ABNORMAL LOW (ref >59)
GFR calc Af Amer: 55 mL/min — ABNORMAL LOW (ref >60)
GFR calc non Af Amer: 46 mL/min/{1.73_m2} — ABNORMAL LOW (ref >59)
GFR calc non Af Amer: 47 mL/min — ABNORMAL LOW (ref >60)
Glucose, Bld: 231 mg/dL — ABNORMAL HIGH (ref 70–99)
Glucose: 270 mg/dL — ABNORMAL HIGH (ref 65–99)
Potassium: 4.8 mmol/L (ref 3.5–5.2)
Potassium: 4.4 mmol/L (ref 3.5–5.1)
Sodium: 137 mmol/L (ref 134–144)
Sodium: 137 mmol/L (ref 135–145)

## 2018-07-12 LAB — HEPARIN LEVEL (UNFRACTIONATED)
Heparin Unfractionated: 0.15 IU/mL — ABNORMAL LOW (ref 0.30–0.70)
Heparin Unfractionated: 0.24 IU/mL — ABNORMAL LOW (ref 0.30–0.70)

## 2018-07-12 LAB — LIPID PANEL
Cholesterol: 150 mg/dL (ref 0–200)
HDL: 33 mg/dL — ABNORMAL LOW (ref >40)
LDL Cholesterol: 43 mg/dL (ref 0–99)
Total CHOL/HDL Ratio: 4.5 RATIO
Triglycerides: 369 mg/dL — ABNORMAL HIGH (ref <150)
VLDL: 74 mg/dL — ABNORMAL HIGH (ref 0–40)

## 2018-07-12 LAB — HIV ANTIBODY (ROUTINE TESTING W REFLEX): HIV Screen 4th Generation wRfx: NONREACTIVE

## 2018-07-12 LAB — CBC
HCT: 37.6 % — ABNORMAL LOW (ref 39.0–52.0)
Hematocrit: 39.9 % (ref 37.5–51.0)
Hemoglobin: 13.9 g/dL (ref 13.0–17.7)
Hemoglobin: 13.3 g/dL (ref 13.0–17.0)
MCH: 34.8 pg — ABNORMAL HIGH (ref 26.6–33.0)
MCH: 33.6 pg (ref 26.0–34.0)
MCHC: 34.8 g/dL (ref 31.5–35.7)
MCHC: 35.4 g/dL (ref 30.0–36.0)
MCV: 100 fL — ABNORMAL HIGH (ref 79–97)
MCV: 94.9 fL (ref 80.0–100.0)
Platelets: 198 10*3/uL (ref 150–450)
Platelets: 194 K/uL (ref 150–400)
RBC: 4 x10E6/uL — ABNORMAL LOW (ref 4.14–5.80)
RBC: 3.96 MIL/uL — ABNORMAL LOW (ref 4.22–5.81)
RDW: 12.2 % (ref 11.6–15.4)
RDW: 11.9 % (ref 11.5–15.5)
WBC: 7.3 10*3/uL (ref 3.4–10.8)
WBC: 6.9 K/uL (ref 4.0–10.5)
nRBC: 0 % (ref 0.0–0.2)

## 2018-07-12 LAB — TROPONIN I
Troponin I: 4.69 ng/mL (ref <0.03)
Troponin I: 3.91 ng/mL (ref <0.03)
Troponin I: 3.74 ng/mL (ref <0.03)

## 2018-07-12 LAB — PROTIME-INR
INR: 1.1 (ref 0.8–1.2)
Prothrombin Time: 11.4 s (ref 9.1–12.0)

## 2018-07-12 LAB — T4, FREE: Free T4: 0.79 ng/dL — ABNORMAL LOW (ref 0.82–1.77)

## 2018-07-12 LAB — TSH: TSH: 1.801 u[IU]/mL (ref 0.350–4.500)

## 2018-07-12 LAB — ECHOCARDIOGRAM COMPLETE
Height: 70 in
Weight: 2745.6 oz

## 2018-07-12 LAB — GLUCOSE, CAPILLARY
Glucose-Capillary: 158 mg/dL — ABNORMAL HIGH (ref 70–99)
Glucose-Capillary: 173 mg/dL — ABNORMAL HIGH (ref 70–99)
Glucose-Capillary: 163 mg/dL — ABNORMAL HIGH (ref 70–99)
Glucose-Capillary: 235 mg/dL — ABNORMAL HIGH (ref 70–99)

## 2018-07-12 LAB — BRAIN NATRIURETIC PEPTIDE: B Natriuretic Peptide: 628.3 pg/mL — ABNORMAL HIGH (ref 0.0–100.0)

## 2018-07-12 MED ORDER — SODIUM CHLORIDE 0.9 % WEIGHT BASED INFUSION: 1.0000 mL/kg/h | INTRAVENOUS | Status: DC

## 2018-07-12 MED ORDER — LOSARTAN POTASSIUM 25 MG PO TABS
25.0000 mg | ORAL_TABLET | Freq: Every day | ORAL | Status: DC
  Administered 2018-07-12 – 2018-07-17 (×5): 25 mg via ORAL
  Filled 2018-07-12 (×5): qty 1

## 2018-07-12 MED ORDER — SODIUM CHLORIDE 0.9 % IV SOLN: 250.0000 mL | INTRAVENOUS | Status: DC | PRN

## 2018-07-12 MED ORDER — ASPIRIN 81 MG PO CHEW: 81.0000 mg | CHEWABLE_TABLET | ORAL | Status: AC

## 2018-07-12 MED ORDER — SODIUM CHLORIDE 0.9 % WEIGHT BASED INFUSION: 3.0000 mL/kg/h | INTRAVENOUS | Status: AC

## 2018-07-12 MED ORDER — SODIUM CHLORIDE 0.9% FLUSH: 3.0000 mL | INTRAVENOUS | Status: DC | PRN

## 2018-07-12 MED ORDER — SODIUM CHLORIDE 0.9% FLUSH
3.0000 mL | Freq: Two times a day (BID) | INTRAVENOUS | Status: DC
  Administered 2018-07-12 – 2018-07-14 (×3): 3 mL via INTRAVENOUS

## 2018-07-12 NOTE — Progress Notes (Signed)
  Echocardiogram 2D Echocardiogram has been performed.  [name] 07/12/2018, 10:32 AM

## 2018-07-12 NOTE — Progress Notes (Addendum)
Nutrition Brief Note  Patient identified on the Malnutrition Screening Tool (MST) Report.  Spoke with pt via phone call to room. Pt reports that he is very hungry and has not yet gotten a chance to order his breakfast.  Pt endorses having a good appetite PTA. Pt states that when he has his syncopal episode ~10 days ago, his appetite dropped off for a few days but that it is back now.  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  Pt denies any nutrition-related issues other than feeling hungry and wanting or order breakfast.  Wt Readings from Last 15 Encounters:  07/12/18 77.8 kg  07/11/18 77.6 kg    Body mass index is 24.62 kg/m. Patient meets criteria for normal weight based on current BMI.   Current diet order is Carb Modified. No meal completions recorded at this time. Labs and medications reviewed.   No nutrition interventions warranted at this time. If nutrition issues arise, please consult RD.    Earma Reading, MS, RD, LDN Inpatient Clinical Dietitian Pager: [phone] Weekend/After Hours: [phone].

## 2018-07-12 NOTE — Progress Notes (Signed)
ANTICOAGULATION CONSULT NOTE  Pharmacy Consult for heparin Indication: chest pain/ACS  Patient Measurements: Height: 5\' 10"  (177.8 cm) Weight: 171 lb 14.4 oz (78 kg) IBW/kg (Calculated) : 73 Heparin Dosing Weight: 77 kg  Vital Signs: Temp: 97.5 F (36.4 C) (05/23 0523) Temp Source: Oral (05/23 0523) BP: 121/82 (05/23 0523) Pulse Rate: 69 (05/23 0523)  Labs: Recent Labs    07/11/18 1927 07/11/18 2317 07/12/18 0350  HGB 14.2  --  13.3  HCT 40.4  --  37.6*  PLT 211  --  194  APTT  --  77*  --   LABPROT  --  15.1  --   INR  --  1.2  --   HEPARINUNFRC  --   --  0.15*  CREATININE 1.48*  --  1.49*  TROPONINI 4.44* 4.69* 3.91*    Assessment: Syncopal episode earlier this week. Positive troponin. Starting heparin for rule out ACS. SCr up to 1.48, no baseline SCr since 2012 (normal then). Not on anticoagulation prior to admission.  Initial heparin level subtherapeutic at 0.15 with AM labs, no infusion issues overnight per RN  Goal of Therapy:  Heparin level 0.3-0.7 units/ml Monitor platelets by anticoagulation protocol: Yes    Plan:  Increase heparin gtt to 1150 units/hr F/u 8 hour heparin level  Daylene Posey, PharmD Clinical Pharmacist Please check AMION for all [facility] numbers 07/12/2018 5:41 AM

## 2018-07-12 NOTE — Progress Notes (Signed)
Progress Note  Patient Name: Nicholas Mills Date of Encounter: 07/12/2018  Primary Cardiologist: Dulce Sellar   Subjective   68 yo with DM, syncope.   Had a myoview study at [facility] hospital which showed marked LV dysfunction .   Troponin levels are elevated.  Transferred to [facility] for heparin and scheduled for cath on Tuesday .  Troponin levels are trending down.  The troponin this morning is 3.91. ECG shows normal sinus rhythm.  Right bundle branch block with ST segment depression in the anterior leads and  the inferior leads. Inpatient Medications    Scheduled Meds: . [START ON 07/13/2018] aspirin  81 mg Oral Pre-Cath  . aspirin EC  81 mg Oral Daily  . feeding supplement  1 Container Oral TID BM  . insulin aspart  0-15 Units Subcutaneous TID WC  . insulin aspart  0-5 Units Subcutaneous QHS  . metoprolol tartrate  25 mg Oral BID  . rosuvastatin  10 mg Oral Daily  . sodium chloride flush  3 mL Intravenous Q12H   Continuous Infusions: . sodium chloride    . sodium chloride    . heparin 1,150 Units/hr (07/12/18 0554)   PRN Meds: sodium chloride, acetaminophen, ketotifen, nitroGLYCERIN, ondansetron (ZOFRAN) IV, sodium chloride flush   Vital Signs    Vitals:   07/11/18 2228 07/11/18 2230 07/11/18 2316 07/12/18 0523  BP:  105/89 129/85 121/82  Pulse:  68 83 69  Resp:  20 18 20   Temp: 97.6 F (36.4 C)  98.1 F (36.7 C) (!) 97.5 F (36.4 C)  TempSrc: Oral  Oral Oral  SpO2:  94% 96% 94%  Weight:   78 kg 77.8 kg  Height:   5\' 10"  (1.778 m)     Intake/Output Summary (Last 24 hours) at 07/12/2018 0920 Last data filed at 07/12/2018 0600 Gross per 24 hour  Intake 324.43 ml  Output -  Net 324.43 ml   Last 3 Weights 07/12/2018 07/11/2018 07/11/2018  Weight (lbs) 171 lb 9.6 oz 171 lb 14.4 oz 171 lb  Weight (kg) 77.837 kg 77.973 kg 77.565 kg      Telemetry    NSR  - Personally Reviewed  ECG     nsr  - Personally Reviewed  Physical Exam   GEN: No acute distress.  Pain free   Neck: No JVD Cardiac: RRR, no murmurs, rubs, or gallops.  Respiratory: Clear to auscultation bilaterally. GI: Soft, nontender, non-distended  MS: No edema; No deformity. Neuro:  Nonfocal  Psych: Normal affect   Labs    Chemistry Recent Labs  Lab 07/11/18 1421 07/11/18 1927 07/12/18 0350  NA 137 134* 137  K 4.8 4.1 4.4  CL 98 97* 100  CO2 25 23 25   GLUCOSE 270* 140* 231*  BUN 26 27* 26*  CREATININE 1.53* 1.48* 1.49*  CALCIUM 11.7* 12.2* 11.1*  GFRNONAA 46* 48* 47*  GFRAA 53* 55* 55*  ANIONGAP  --  14 12     Hematology Recent Labs  Lab 07/11/18 1421 07/11/18 1927 07/12/18 0350  WBC 7.3 7.7 6.9  RBC 4.00* 4.29 3.96*  HGB 13.9 14.2 13.3  HCT 39.9 40.4 37.6*  MCV 100* 94.2 94.9  MCH 34.8* 33.1 33.6  MCHC 34.8 35.1 35.4  RDW 12.2 12.0 11.9  PLT 198 211 194    Cardiac Enzymes Recent Labs  Lab 07/11/18 1411 07/11/18 1927 07/11/18 2317 07/12/18 0350  TROPONINI 6.80* 4.44* 4.69* 3.91*   No results for input(s): TROPIPOC in the last 168 hours.  BNP Recent Labs  Lab 07/11/18 2317  BNP 628.3*     DDimer No results for input(s): DDIMER in the last 168 hours.   Radiology    Dg Chest Portable 1 View  Result Date: 07/11/2018 CLINICAL DATA:  Chest pain following multiple syncopal episodes EXAM: PORTABLE CHEST 1 VIEW COMPARISON:  None. FINDINGS: Cardiac shadow is mildly enlarged. Large hiatal hernia is seen. Lungs are well aerated bilaterally with mild likely chronic interstitial changes. No edematous changes are seen. No effusion is noted. No bony abnormality is noted. IMPRESSION: No active disease. Electronically Signed   By: Alcide CleverMark  Lukens M.D.   On: 07/11/2018 21:19    Cardiac Studies     Patient Profile     68 y.o. male  With DM ,  Admitted following an episode of syncope 10 days ago.  + troponins   Assessment & Plan    1 4.  Coronary artery disease: The patient was admitted following an episode of syncope 10 days ago.  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  He was walking back to his truck and started feeling very lightheaded.  He passed out and was out for an unknown period of time.  He thinks for about an hour.  He is never had chest pain but has had profuse diaphoresis.  He has had some shortness of breath with exertion.  Troponin levels are elevated.  The troponins are trending down at this point.  His EKG shows a right bundle branch block with ST depression.  At this point he remains pain-free and is very comfortable.  He is on a heparin drip.  The plan is to keep him stable and to schedule heart catheterization for Tuesday.  I have advised him to call the nurse if he develops any sort of chest pain chest pressure or lightheadedness.  2.  Diabetes mellitus: Continue current medications.  Acute systolic congestive heart failure: The patient had a Myoview study with an ejection fraction of 28%.  He has a fixed anteroseptal scar with ischemia in the inferolateral region. He has been started on metoprolol 25 BID . Will add Losartan 25 mg a day    For questions or updates, please contact [facility] Please consult [email or web address] for contact info under        Signed, Kristeen MissPhilip Neveen Daponte, MD  07/12/2018, 9:20 AM

## 2018-07-12 NOTE — Progress Notes (Signed)
ANTICOAGULATION CONSULT NOTE - Follow Up Consult  Pharmacy Consult for Heparin Indication: atrial fibrillation  No Known Allergies  Patient Measurements: Height: 5\' 10"  (177.8 cm) Weight: 171 lb 9.6 oz (77.8 kg) IBW/kg (Calculated) : 73 Heparin Dosing Weight: 77.8 kg  Vital Signs: Temp: 97.7 F (36.5 C) (05/23 1318) Temp Source: Oral (05/23 1318) BP: 112/75 (05/23 1318) Pulse Rate: 68 (05/23 1318)  Labs: Recent Labs    07/11/18 1421 07/11/18 1927 07/11/18 2317 07/12/18 0350 07/12/18 0949 07/12/18 1425  HGB 13.9 14.2  --  13.3  --   --   HCT 39.9 40.4  --  37.6*  --   --   PLT 198 211  --  194  --   --   APTT  --   --  77*  --   --   --   LABPROT 11.4  --  15.1  --   --   --   INR 1.1  --  1.2  --   --   --   HEPARINUNFRC  --   --   --  0.15*  --  0.24*  CREATININE 1.53* 1.48*  --  1.49*  --   --   TROPONINI  --  4.44* 4.69* 3.91* 3.74*  --     Estimated Creatinine Clearance: 48.3 mL/min (A) (by C-G formula based on SCr of 1.49 mg/dL (H)).   Assessment: Anticoag: heparin gtt for ACS Initial heparin level subtherapeutic at 0.15 with AM labs, no infusion issues overnight per RN. 2nd HL 0.24 remains low.  Goal of Therapy:  Heparin level 0.3-0.7 units/ml Monitor platelets by anticoagulation protocol: Yes   Plan:  Increase heparin gtt to 1300 units/hr F/u 6 hour heparin level Cath Tuesday Daily HL and CBC   Asianae Minkler S. Merilynn Finland, PharmD, BCPS Clinical Staff Pharmacist Misty Stanley Stillinger 07/12/2018,3:29 PM

## 2018-07-13 DIAGNOSIS — R55 Syncope and collapse: Secondary | ICD-10-CM

## 2018-07-13 LAB — HEPARIN LEVEL (UNFRACTIONATED)
Heparin Unfractionated: 0.3 IU/mL (ref 0.30–0.70)
Heparin Unfractionated: 0.32 IU/mL (ref 0.30–0.70)

## 2018-07-13 LAB — CBC
HCT: 40.3 % (ref 39.0–52.0)
Hemoglobin: 14.1 g/dL (ref 13.0–17.0)
MCH: 33.1 pg (ref 26.0–34.0)
MCHC: 35 g/dL (ref 30.0–36.0)
MCV: 94.6 fL (ref 80.0–100.0)
Platelets: 201 10*3/uL (ref 150–400)
RBC: 4.26 MIL/uL (ref 4.22–5.81)
RDW: 12 % (ref 11.5–15.5)
WBC: 7.5 10*3/uL (ref 4.0–10.5)
nRBC: 0 % (ref 0.0–0.2)

## 2018-07-13 LAB — GLUCOSE, CAPILLARY
Glucose-Capillary: 154 mg/dL — ABNORMAL HIGH (ref 70–99)
Glucose-Capillary: 274 mg/dL — ABNORMAL HIGH (ref 70–99)
Glucose-Capillary: 210 mg/dL — ABNORMAL HIGH (ref 70–99)

## 2018-07-13 NOTE — Progress Notes (Signed)
ANTICOAGULATION CONSULT NOTE - Follow Up Consult  Pharmacy Consult for Heparin Indication: atrial fibrillation  No Known Allergies  Patient Measurements: Height: 5\' 10"  (177.8 cm) Weight: 171 lb 9.6 oz (77.8 kg) IBW/kg (Calculated) : 73 Heparin Dosing Weight: 77.8 kg  Vital Signs: Temp: 97.9 F (36.6 C) (05/24 0636) Temp Source: Oral (05/24 0636) BP: 133/87 (05/24 0636) Pulse Rate: 67 (05/24 0636)  Labs: Recent Labs    07/11/18 1421  07/11/18 1927 07/11/18 2317  07/12/18 0350 07/12/18 0949 07/12/18 1425 07/12/18 2328 07/13/18 0532  HGB 13.9   < > 14.2  --   --  13.3  --   --   --  14.1  HCT 39.9  --  40.4  --   --  37.6*  --   --   --  40.3  PLT 198  --  211  --   --  194  --   --   --  201  APTT  --   --   --  77*  --   --   --   --   --   --   LABPROT 11.4  --   --  15.1  --   --   --   --   --   --   INR 1.1  --   --  1.2  --   --   --   --   --   --   HEPARINUNFRC  --   --   --   --    < > 0.15*  --  0.24* 0.30 0.32  CREATININE 1.53*  --  1.48*  --   --  1.49*  --   --   --   --   TROPONINI  --   --  4.44* 4.69*  --  3.91* 3.74*  --   --   --    < > = values in this interval not displayed.    Estimated Creatinine Clearance: 48.3 mL/min (A) (by C-G formula based on SCr of 1.49 mg/dL (H)).   Assessment: Anticoag: heparin gtt for ACS HL 0.32 now in goal. CBC WNL  Goal of Therapy:  Heparin level 0.3-0.7 units/ml Monitor platelets by anticoagulation protocol: Yes   Plan:  Continue heparin gtt at 1300 units/hr Daily HL and CBC Cath Tuesday   Guliana Weyandt S. Merilynn Finland, PharmD, BCPS Clinical Staff Pharmacist Misty Stanley Stillinger 07/13/2018,8:02 AM

## 2018-07-13 NOTE — Plan of Care (Signed)
  Problem: Clinical Measurements: Goal: Will remain free from infection Outcome: Progressing   Problem: Clinical Measurements: Goal: Diagnostic test results will improve Outcome: Progressing   Problem: Education: Goal: Understanding of cardiac disease, CV risk reduction, and recovery process will improve Outcome: Progressing Goal: Understanding of medication regimen will improve Outcome: Progressing Goal: Individualized Educational Video(s) Outcome: Progressing

## 2018-07-13 NOTE — Progress Notes (Signed)
Progress Note  Patient Name: Nicholas FlesherJohn Mills Date of Encounter: 07/13/2018  Primary Cardiologist: Dulce SellarMunley   Subjective   68 yo with DM, syncope.   [facility] which showed marked LV dysfunction .   Troponin levels are elevated.  [facility] and scheduled for cath on Tuesday .  Troponin levels are trending down.  The troponin this morning is 3.91. ECG shows normal sinus rhythm.  Right bundle branch block with ST segment depression in the anterior leads and  the inferior leads.  Has felt well overnight.   troponins have continued to trend downward  Inpatient Medications    Scheduled Meds:  aspirin  81 mg Oral Pre-Cath   aspirin EC  81 mg Oral Daily   feeding supplement  1 Container Oral TID BM   insulin aspart  0-15 Units Subcutaneous TID WC   insulin aspart  0-5 Units Subcutaneous QHS   losartan  25 mg Oral Daily   metoprolol tartrate  25 mg Oral BID   rosuvastatin  10 mg Oral Daily   sodium chloride flush  3 mL Intravenous Q12H   Continuous Infusions:  sodium chloride     sodium chloride     heparin 1,300 Units/hr (07/12/18 1800)   PRN Meds: sodium chloride, acetaminophen, ketotifen, nitroGLYCERIN, ondansetron (ZOFRAN) IV, sodium chloride flush   Vital Signs    Vitals:   07/12/18 1318 07/12/18 2037 07/13/18 0636 07/13/18 0823  BP: 112/75 (!) 174/96 133/87 134/80  Pulse: 68 80 67 67  Resp: 18 19 18    Temp: 97.7 F (36.5 C) 98.2 F (36.8 C) 97.9 F (36.6 C)   TempSrc: Oral  Oral   SpO2: 96% 99% 96% 94%  Weight:      Height:        Intake/Output Summary (Last 24 hours) at 07/13/2018 1029 Last data filed at 07/13/2018 0820 Gross per 24 hour  Intake 752.5 ml  Output 700 ml  Net 52.5 ml   Last 3 Weights 07/12/2018 07/11/2018 07/11/2018  Weight (lbs) 171 lb 9.6 oz 171 lb 14.4 oz 171 lb  Weight (kg) 77.837 kg 77.973 kg 77.565 kg      Telemetry    NSR   - Personally Reviewed  ECG      Personally  Reviewed  Physical Exam   Physical Exam: Blood pressure 134/80, pulse 67, temperature 97.9 F (36.6 C), temperature source Oral, resp. rate 18, height 5\' 10"  (1.778 m), weight 77.8 kg, SpO2 94 %.  GEN:  Well nourished, well developed in no acute distress HEENT: Normal NECK: No JVD; No carotid bruits LYMPHATICS: No lymphadenopathy CARDIAC: RRR , no murmurs, rubs, gallops RESPIRATORY:  Clear to auscultation without rales, wheezing or rhonchi  ABDOMEN: Soft, non-tender, non-distended MUSCULOSKELETAL:  No edema; No deformity  SKIN: Warm and dry NEUROLOGIC:  Alert and oriented x 3   Labs    Chemistry Recent Labs  Lab 07/11/18 1421 07/11/18 1927 07/12/18 0350  NA 137 134* 137  K 4.8 4.1 4.4  CL 98 97* 100  CO2 25 23 25   GLUCOSE 270* 140* 231*  BUN 26 27* 26*  CREATININE 1.53* 1.48* 1.49*  CALCIUM 11.7* 12.2* 11.1*  GFRNONAA 46* 48* 47*  GFRAA 53* 55* 55*  ANIONGAP  --  14 12     Hematology Recent Labs  Lab 07/11/18 1927 07/12/18 0350 07/13/18 0532  WBC 7.7 6.9 7.5  RBC 4.29 3.96* 4.26  HGB 14.2 13.3 14.1  HCT  40.4 37.6* 40.3  MCV 94.2 94.9 94.6  MCH 33.1 33.6 33.1  MCHC 35.1 35.4 35.0  RDW 12.0 11.9 12.0  PLT 211 194 201    Cardiac Enzymes Recent Labs  Lab 07/11/18 1927 07/11/18 2317 07/12/18 0350 07/12/18 0949  TROPONINI 4.44* 4.69* 3.91* 3.74*   No results for input(s): TROPIPOC in the last 168 hours.   BNP Recent Labs  Lab 07/11/18 2317  BNP 628.3*     DDimer No results for input(s): DDIMER in the last 168 hours.   Radiology    Dg Chest Portable 1 View  Result Date: 07/11/2018 CLINICAL DATA:  Chest pain following multiple syncopal episodes EXAM: PORTABLE CHEST 1 VIEW COMPARISON:  None. FINDINGS: Cardiac shadow is mildly enlarged. Large hiatal hernia is seen. Lungs are well aerated bilaterally with mild likely chronic interstitial changes. No edematous changes are seen. No effusion is noted. No bony abnormality is noted. IMPRESSION: No  active disease. Electronically Signed   By: Alcide Clever M.D.   On: 07/11/2018 21:19    Cardiac Studies     Patient Profile     68 y.o. male  With DM ,  Admitted following an episode of syncope 10 days ago.  + troponins   Assessment & Plan    1 4.  Coronary artery disease: The patient was admitted following an episode of syncope 10 days ago.  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  He was walking back to his truck and started feeling very lightheaded.  He passed out and was out for an unknown period of time.  He thinks for about an hour.  He is never had chest pain but has had profuse diaphoresis.  He has had some shortness of breath with exertion.  Troponin levels are elevated.  The troponins are trending down at this point.   Echo shows ant akinesis .  EF 40-45% . Mod pulmonary HTN with PA  pressure of 53 mmhg.     At this point he remains pain-free and is very comfortable.  He is on a heparin drip.  The plan is to keep him stable and to schedule heart catheterization for Tuesday.  I have advised him to call the nurse if he develops any sort of chest pain chest pressure or lightheadedness.  Ive discussed the risks, benefits, options  Of heart cath. He understands and agrees to proceed    2.  Diabetes mellitus: Continue current medications.  3.  Acute systolic congestive heart failure: The patient had a Myoview study with an ejection fraction of 28%.  He has a fixed anteroseptal scar with ischemia in the inferolateral region. Cont meds.      For questions or updates, please contact [facility] Please consult [email or web address] for contact info under        Signed, Kristeen Miss, MD  07/13/2018, 10:29 AM

## 2018-07-13 NOTE — Progress Notes (Signed)
ANTICOAGULATION CONSULT NOTE  Pharmacy Consult for heparin Indication: chest pain/ACS  Patient Measurements: Height: 5\' 10"  (177.8 cm) Weight: 171 lb 9.6 oz (77.8 kg) IBW/kg (Calculated) : 73 Heparin Dosing Weight: 77 kg  Vital Signs: Temp: 98.2 F (36.8 C) (05/23 2037) Temp Source: Oral (05/23 1318) BP: 174/96 (05/23 2037) Pulse Rate: 80 (05/23 2037)  Labs: Recent Labs    07/11/18 1421 07/11/18 1927 07/11/18 2317 07/12/18 0350 07/12/18 0949 07/12/18 1425 07/12/18 2328  HGB 13.9 14.2  --  13.3  --   --   --   HCT 39.9 40.4  --  37.6*  --   --   --   PLT 198 211  --  194  --   --   --   APTT  --   --  77*  --   --   --   --   LABPROT 11.4  --  15.1  --   --   --   --   INR 1.1  --  1.2  --   --   --   --   HEPARINUNFRC  --   --   --  0.15*  --  0.24* 0.30  CREATININE 1.53* 1.48*  --  1.49*  --   --   --   TROPONINI  --  4.44* 4.69* 3.91* 3.74*  --   --     Assessment: 68 y.o. male with elevated cardiac markers, awaiting cath, for heparin  Goal of Therapy:  Heparin level 0.3-0.7 units/ml Monitor platelets by anticoagulation protocol: Yes    Plan:  Continue Heparin at current rate  Follow-up am labs.   Geannie Risen, PharmD, BCPS  07/13/2018 12:04 AM

## 2018-07-14 DIAGNOSIS — I255 Ischemic cardiomyopathy: Secondary | ICD-10-CM

## 2018-07-14 LAB — GLUCOSE, CAPILLARY
Glucose-Capillary: 146 mg/dL — ABNORMAL HIGH (ref 70–99)
Glucose-Capillary: 335 mg/dL — ABNORMAL HIGH (ref 70–99)
Glucose-Capillary: 172 mg/dL — ABNORMAL HIGH (ref 70–99)
Glucose-Capillary: 279 mg/dL — ABNORMAL HIGH (ref 70–99)

## 2018-07-14 LAB — CBC
HCT: 40.1 % (ref 39.0–52.0)
Hemoglobin: 14 g/dL (ref 13.0–17.0)
MCH: 32.8 pg (ref 26.0–34.0)
MCHC: 34.9 g/dL (ref 30.0–36.0)
MCV: 93.9 fL (ref 80.0–100.0)
Platelets: 204 10*3/uL (ref 150–400)
RBC: 4.27 MIL/uL (ref 4.22–5.81)
RDW: 11.9 % (ref 11.5–15.5)
WBC: 8.4 10*3/uL (ref 4.0–10.5)
nRBC: 0 % (ref 0.0–0.2)

## 2018-07-14 LAB — HEPARIN LEVEL (UNFRACTIONATED): Heparin Unfractionated: 0.31 IU/mL (ref 0.30–0.70)

## 2018-07-14 MED ORDER — SODIUM CHLORIDE 0.9 % IV SOLN: 250.0000 mL | INTRAVENOUS | Status: DC | PRN

## 2018-07-14 MED ORDER — SODIUM CHLORIDE 0.9 % WEIGHT BASED INFUSION: 1.0000 mL/kg/h | INTRAVENOUS | Status: DC

## 2018-07-14 MED ORDER — SODIUM CHLORIDE 0.9% FLUSH: 3.0000 mL | INTRAVENOUS | Status: DC | PRN

## 2018-07-14 MED ORDER — SODIUM CHLORIDE 0.9 % WEIGHT BASED INFUSION
3.0000 mL/kg/h | INTRAVENOUS | Status: DC
  Administered 2018-07-15: 3 mL/kg/h via INTRAVENOUS

## 2018-07-14 MED ORDER — SODIUM CHLORIDE 0.9% FLUSH
3.0000 mL | Freq: Two times a day (BID) | INTRAVENOUS | Status: DC
  Administered 2018-07-14 (×2): 3 mL via INTRAVENOUS

## 2018-07-14 MED ORDER — ASPIRIN 81 MG PO CHEW
81.0000 mg | CHEWABLE_TABLET | ORAL | Status: AC
  Administered 2018-07-15: 05:00:00 81 mg via ORAL
  Filled 2018-07-14: qty 1

## 2018-07-14 NOTE — Care Management Important Message (Signed)
Important Message  Patient Details  Name: Nicholas Mills MRN: [id number] Date of Birth: 23-May-1950   Medicare Important Message Given:  Yes    Jelisha Weed Stefan Church 07/14/2018, 3:41 PM

## 2018-07-14 NOTE — Plan of Care (Signed)

## 2018-07-14 NOTE — Progress Notes (Signed)
Progress Note  Patient Name: Nicholas Mills Date of Encounter: 07/14/2018  Primary Cardiologist: Dulce Sellar  Subjective   No chest pain or dyspnea this am.   Inpatient Medications    Scheduled Meds: . aspirin EC  81 mg Oral Daily  . feeding supplement  1 Container Oral TID BM  . insulin aspart  0-15 Units Subcutaneous TID WC  . insulin aspart  0-5 Units Subcutaneous QHS  . losartan  25 mg Oral Daily  . metoprolol tartrate  25 mg Oral BID  . rosuvastatin  10 mg Oral Daily  . sodium chloride flush  3 mL Intravenous Q12H   Continuous Infusions: . sodium chloride    . sodium chloride    . heparin 1,300 Units/hr (07/13/18 1600)   PRN Meds: sodium chloride, acetaminophen, ketotifen, nitroGLYCERIN, ondansetron (ZOFRAN) IV, sodium chloride flush   Vital Signs    Vitals:   07/13/18 0823 07/13/18 1328 07/13/18 2139 07/14/18 0609  BP: 134/80 123/77 113/76 126/84  Pulse: 67 65 65 64  Resp:  17 18 18   Temp:  (!) 97.5 F (36.4 C) 98.3 F (36.8 C) 98.1 F (36.7 C)  TempSrc:  Axillary Oral Oral  SpO2: 94%  92% 92%  Weight:    74.5 kg  Height:        Intake/Output Summary (Last 24 hours) at 07/14/2018 0749 Last data filed at 07/13/2018 2300 Gross per 24 hour  Intake 545 ml  Output -  Net 545 ml   Last 3 Weights 07/14/2018 07/12/2018 07/11/2018  Weight (lbs) 164 lb 4.8 oz 171 lb 9.6 oz 171 lb 14.4 oz  Weight (kg) 74.526 kg 77.837 kg 77.973 kg      Telemetry    Sinus - Personally Reviewed  ECG    No AM EKG - Personally Reviewed  Physical Exam   GEN: No acute distress.   Neck: No JVD Cardiac: RRR, no murmurs, rubs, or gallops.  Respiratory: Clear to auscultation bilaterally. GI: Soft, nontender, non-distended  MS: No edema; No deformity. Neuro:  Nonfocal  Psych: Normal affect   Labs    Chemistry Recent Labs  Lab 07/11/18 1421 07/11/18 1927 07/12/18 0350  NA 137 134* 137  K 4.8 4.1 4.4  CL 98 97* 100  CO2 25 23 25   GLUCOSE 270* 140* 231*  BUN 26 27* 26*   CREATININE 1.53* 1.48* 1.49*  CALCIUM 11.7* 12.2* 11.1*  GFRNONAA 46* 48* 47*  GFRAA 53* 55* 55*  ANIONGAP  --  14 12     Hematology Recent Labs  Lab 07/12/18 0350 07/13/18 0532 07/14/18 0444  WBC 6.9 7.5 8.4  RBC 3.96* 4.26 4.27  HGB 13.3 14.1 14.0  HCT 37.6* 40.3 40.1  MCV 94.9 94.6 93.9  MCH 33.6 33.1 32.8  MCHC 35.4 35.0 34.9  RDW 11.9 12.0 11.9  PLT 194 201 204    Cardiac Enzymes Recent Labs  Lab 07/11/18 1927 07/11/18 2317 07/12/18 0350 07/12/18 0949  TROPONINI 4.44* 4.69* 3.91* 3.74*   No results for input(s): TROPIPOC in the last 168 hours.   BNP Recent Labs  Lab 07/11/18 2317  BNP 628.3*     DDimer No results for input(s): DDIMER in the last 168 hours.   Radiology    No results found.  Cardiac Studies     Patient Profile     68 y.o. male with HLD and DM admitted post syncope and found to have high risk myoview with reduced LVEF and anterior wall akinesis on echo. Troponin  elevated and trending down.   Assessment & Plan    1. CAD/NSTEMI: Admitted following a syncopal event. Anterior wall hypokinesis on echo. Nuclear stress test with fixed anteroseptal scar with ischemic in the inferolateral region. He is now pain free. Troponin trending down, lasted checked 07/12/18. Will plan cardiac cath tomorrow. NPO at midnight. Continue ASA, statin, beta blocker and IV heparin while awaiting cardiac cath.   2. DM: SSI currently. Metformin on hold for cath  3. Cardiomyopathy: Presumed to be ischemic. Cath tomorrow. No evidence of volume overload on exam today.   For questions or updates, please contact [facility] Please consult [email or web address] for contact info under        Signed, Verne Carrowhristopher , MD  07/14/2018, 7:49 AM

## 2018-07-14 NOTE — Care Management Important Message (Signed)
Important Message  Patient Details  Name: Buford Bedsole MRN: [id number] Date of Birth: 02-21-1950   Medicare Important Message Given:  Yes    Brantley Naser Stefan Church 07/14/2018, 3:38 PM

## 2018-07-14 NOTE — Progress Notes (Signed)
ANTICOAGULATION CONSULT NOTE - Follow Up Consult  Pharmacy Consult for Heparin Indication: atrial fibrillation  No Known Allergies  Patient Measurements: Height: 5\' 10"  (177.8 cm) Weight: 164 lb 4.8 oz (74.5 kg) IBW/kg (Calculated) : 73 Heparin Dosing Weight: 77.8 kg  Vital Signs: Temp: 98.1 F (36.7 C) (05/25 0609) Temp Source: Oral (05/25 0609) BP: 126/84 (05/25 0609) Pulse Rate: 64 (05/25 0609)  Labs: Recent Labs    07/11/18 1421  07/11/18 1927 07/11/18 2317 07/12/18 0350 07/12/18 0949  07/12/18 2328 07/13/18 0532 07/14/18 0444  HGB 13.9   < > 14.2  --  13.3  --   --   --  14.1 14.0  HCT 39.9   < > 40.4  --  37.6*  --   --   --  40.3 40.1  PLT 198   < > 211  --  194  --   --   --  201 204  APTT  --   --   --  77*  --   --   --   --   --   --   LABPROT 11.4  --   --  15.1  --   --   --   --   --   --   INR 1.1  --   --  1.2  --   --   --   --   --   --   HEPARINUNFRC  --   --   --   --  0.15*  --    < > 0.30 0.32 0.31  CREATININE 1.53*  --  1.48*  --  1.49*  --   --   --   --   --   TROPONINI  --   --  4.44* 4.69* 3.91* 3.74*  --   --   --   --    < > = values in this interval not displayed.    Estimated Creatinine Clearance: 48.3 mL/min (A) (by C-G formula based on SCr of 1.49 mg/dL (H)).   Assessment: Nicholas Mills is a 68yo male admitted with NSTEMI. Pharmacy consulted for heparin gtt. Echo shows EF 40-45%  5/25 Update: Heparin gtt currently at 1300 units/hr and patient heparin level therapeutic at 0.31. CBC continues to be stable with hgb 14 and pltc 204. No changes.  Goal of Therapy:  Heparin level 0.3-0.7 units/ml Monitor platelets by anticoagulation protocol: Yes   Plan:  Continue heparin gtt at 1300 units/hr Daily HL and CBC Cath Tuesday  Thank you for involving pharmacy in this patient's care.  Wendelyn Breslow, PharmD PGY1 Pharmacy Resident Phone: [phone] 07/14/2018 8:46 AM

## 2018-07-14 NOTE — H&P (View-Only) (Signed)
Progress Note  Patient Name: Nicholas Mills Date of Encounter: 07/14/2018  Primary Cardiologist: Dulce Sellar  Subjective   No chest pain or dyspnea this am.   Inpatient Medications    Scheduled Meds: . aspirin EC  81 mg Oral Daily  . feeding supplement  1 Container Oral TID BM  . insulin aspart  0-15 Units Subcutaneous TID WC  . insulin aspart  0-5 Units Subcutaneous QHS  . losartan  25 mg Oral Daily  . metoprolol tartrate  25 mg Oral BID  . rosuvastatin  10 mg Oral Daily  . sodium chloride flush  3 mL Intravenous Q12H   Continuous Infusions: . sodium chloride    . sodium chloride    . heparin 1,300 Units/hr (07/13/18 1600)   PRN Meds: sodium chloride, acetaminophen, ketotifen, nitroGLYCERIN, ondansetron (ZOFRAN) IV, sodium chloride flush   Vital Signs    Vitals:   07/13/18 0823 07/13/18 1328 07/13/18 2139 07/14/18 0609  BP: 134/80 123/77 113/76 126/84  Pulse: 67 65 65 64  Resp:  17 18 18   Temp:  (!) 97.5 F (36.4 C) 98.3 F (36.8 C) 98.1 F (36.7 C)  TempSrc:  Axillary Oral Oral  SpO2: 94%  92% 92%  Weight:    74.5 kg  Height:        Intake/Output Summary (Last 24 hours) at 07/14/2018 0749 Last data filed at 07/13/2018 2300 Gross per 24 hour  Intake 545 ml  Output -  Net 545 ml   Last 3 Weights 07/14/2018 07/12/2018 07/11/2018  Weight (lbs) 164 lb 4.8 oz 171 lb 9.6 oz 171 lb 14.4 oz  Weight (kg) 74.526 kg 77.837 kg 77.973 kg      Telemetry    Sinus - Personally Reviewed  ECG    No AM EKG - Personally Reviewed  Physical Exam   GEN: No acute distress.   Neck: No JVD Cardiac: RRR, no murmurs, rubs, or gallops.  Respiratory: Clear to auscultation bilaterally. GI: Soft, nontender, non-distended  MS: No edema; No deformity. Neuro:  Nonfocal  Psych: Normal affect   Labs    Chemistry Recent Labs  Lab 07/11/18 1421 07/11/18 1927 07/12/18 0350  NA 137 134* 137  K 4.8 4.1 4.4  CL 98 97* 100  CO2 25 23 25   GLUCOSE 270* 140* 231*  BUN 26 27* 26*   CREATININE 1.53* 1.48* 1.49*  CALCIUM 11.7* 12.2* 11.1*  GFRNONAA 46* 48* 47*  GFRAA 53* 55* 55*  ANIONGAP  --  14 12     Hematology Recent Labs  Lab 07/12/18 0350 07/13/18 0532 07/14/18 0444  WBC 6.9 7.5 8.4  RBC 3.96* 4.26 4.27  HGB 13.3 14.1 14.0  HCT 37.6* 40.3 40.1  MCV 94.9 94.6 93.9  MCH 33.6 33.1 32.8  MCHC 35.4 35.0 34.9  RDW 11.9 12.0 11.9  PLT 194 201 204    Cardiac Enzymes Recent Labs  Lab 07/11/18 1927 07/11/18 2317 07/12/18 0350 07/12/18 0949  TROPONINI 4.44* 4.69* 3.91* 3.74*   No results for input(s): TROPIPOC in the last 168 hours.   BNP Recent Labs  Lab 07/11/18 2317  BNP 628.3*     DDimer No results for input(s): DDIMER in the last 168 hours.   Radiology    No results found.  Cardiac Studies     Patient Profile     68 y.o. male with HLD and DM admitted post syncope and found to have high risk myoview with reduced LVEF and anterior wall akinesis on echo. Troponin  elevated and trending down.   Assessment & Plan    1. CAD/NSTEMI: Admitted following a syncopal event. Anterior wall hypokinesis on echo. Nuclear stress test with fixed anteroseptal scar with ischemic in the inferolateral region. He is now pain free. Troponin trending down, lasted checked 07/12/18. Will plan cardiac cath tomorrow. NPO at midnight. Continue ASA, statin, beta blocker and IV heparin while awaiting cardiac cath.   2. DM: SSI currently. Metformin on hold for cath  3. Cardiomyopathy: Presumed to be ischemic. Cath tomorrow. No evidence of volume overload on exam today.   For questions or updates, please contact [facility] Please consult [email or web address] for contact info under        Signed, Maguire Killmer, MD  07/14/2018, 7:49 AM    

## 2018-07-15 ENCOUNTER — Ambulatory Visit (HOSPITAL_COMMUNITY)
Admission: RE | Admit: 2018-07-15 | Payer: Medicare Other | Source: Home / Self Care | Admitting: Interventional Cardiology

## 2018-07-15 ENCOUNTER — Encounter (HOSPITAL_COMMUNITY)
Admission: EM | Disposition: A | Discharge status: Home / Self Care | Payer: Self-pay | Source: Home / Self Care | Attending: Cardiology

## 2018-07-15 ENCOUNTER — Encounter (HOSPITAL_COMMUNITY): Payer: Self-pay | Admitting: Interventional Cardiology

## 2018-07-15 DIAGNOSIS — I2510 Atherosclerotic heart disease of native coronary artery without angina pectoris: Secondary | ICD-10-CM

## 2018-07-15 DIAGNOSIS — I214 Non-ST elevation (NSTEMI) myocardial infarction: Secondary | ICD-10-CM

## 2018-07-15 DIAGNOSIS — E119 Type 2 diabetes mellitus without complications: Secondary | ICD-10-CM

## 2018-07-15 DIAGNOSIS — E1169 Type 2 diabetes mellitus with other specified complication: Secondary | ICD-10-CM

## 2018-07-15 DIAGNOSIS — I25110 Atherosclerotic heart disease of native coronary artery with unstable angina pectoris: Secondary | ICD-10-CM

## 2018-07-15 DIAGNOSIS — E785 Hyperlipidemia, unspecified: Secondary | ICD-10-CM

## 2018-07-15 DIAGNOSIS — I5021 Acute systolic (congestive) heart failure: Secondary | ICD-10-CM

## 2018-07-15 HISTORY — PX: LEFT HEART CATH AND CORONARY ANGIOGRAPHY: CATH118249

## 2018-07-15 LAB — CBC
HCT: 38.7 % — ABNORMAL LOW (ref 39.0–52.0)
Hemoglobin: 13.7 g/dL (ref 13.0–17.0)
MCH: 33.2 pg (ref 26.0–34.0)
MCHC: 35.4 g/dL (ref 30.0–36.0)
MCV: 93.7 fL (ref 80.0–100.0)
Platelets: 218 10*3/uL (ref 150–400)
RBC: 4.13 MIL/uL — ABNORMAL LOW (ref 4.22–5.81)
RDW: 11.8 % (ref 11.5–15.5)
WBC: 7.7 10*3/uL (ref 4.0–10.5)
nRBC: 0 % (ref 0.0–0.2)

## 2018-07-15 LAB — TROPONIN I: Troponin I: 6.8 ng/mL (ref 0.00–0.04)

## 2018-07-15 LAB — BASIC METABOLIC PANEL
Anion gap: 8 (ref 5–15)
BUN: 24 mg/dL — ABNORMAL HIGH (ref 8–23)
CO2: 29 mmol/L (ref 22–32)
Calcium: 10.2 mg/dL (ref 8.9–10.3)
Chloride: 105 mmol/L (ref 98–111)
Creatinine, Ser: 1.31 mg/dL — ABNORMAL HIGH (ref 0.61–1.24)
GFR calc Af Amer: >60 mL/min (ref >60)
GFR calc non Af Amer: 55 mL/min — ABNORMAL LOW (ref >60)
Glucose, Bld: 173 mg/dL — ABNORMAL HIGH (ref 70–99)
Potassium: 4.4 mmol/L (ref 3.5–5.1)
Sodium: 142 mmol/L (ref 135–145)

## 2018-07-15 LAB — GLUCOSE, CAPILLARY
Glucose-Capillary: 158 mg/dL — ABNORMAL HIGH (ref 70–99)
Glucose-Capillary: 124 mg/dL — ABNORMAL HIGH (ref 70–99)
Glucose-Capillary: 300 mg/dL — ABNORMAL HIGH (ref 70–99)
Glucose-Capillary: 256 mg/dL — ABNORMAL HIGH (ref 70–99)

## 2018-07-15 LAB — HEPARIN LEVEL (UNFRACTIONATED): Heparin Unfractionated: 0.24 IU/mL — ABNORMAL LOW (ref 0.30–0.70)

## 2018-07-15 SURGERY — LEFT HEART CATH AND CORONARY ANGIOGRAPHY
Anesthesia: LOCAL

## 2018-07-15 MED ORDER — SODIUM CHLORIDE 0.9 % IV SOLN: 250.0000 mL | INTRAVENOUS | Status: DC | PRN

## 2018-07-15 MED ORDER — LIDOCAINE HCL (PF) 1 % IJ SOLN
INTRAMUSCULAR | Status: DC | PRN
  Administered 2018-07-15: 2 mL

## 2018-07-15 MED ORDER — LABETALOL HCL 5 MG/ML IV SOLN: 10.0000 mg | INTRAVENOUS | Status: AC | PRN

## 2018-07-15 MED ORDER — HEPARIN (PORCINE) 25000 UT/250ML-% IV SOLN
1550.0000 [IU]/h | INTRAVENOUS | Status: DC
  Administered 2018-07-15: 1400 [IU]/h via INTRAVENOUS
  Administered 2018-07-16: 1550 [IU]/h via INTRAVENOUS
  Filled 2018-07-15 (×4): qty 250

## 2018-07-15 MED ORDER — VERAPAMIL HCL 2.5 MG/ML IV SOLN
INTRAVENOUS | Status: AC
  Filled 2018-07-15: qty 2

## 2018-07-15 MED ORDER — MIDAZOLAM HCL 2 MG/2ML IJ SOLN
INTRAMUSCULAR | Status: AC
  Filled 2018-07-15: qty 2

## 2018-07-15 MED ORDER — ACETAMINOPHEN 325 MG PO TABS: 650.0000 mg | ORAL_TABLET | ORAL | Status: DC | PRN

## 2018-07-15 MED ORDER — IOHEXOL 350 MG/ML SOLN
INTRAVENOUS | Status: DC | PRN
  Administered 2018-07-15: 55 mL via INTRA_ARTERIAL

## 2018-07-15 MED ORDER — SODIUM CHLORIDE 0.9% FLUSH: 3.0000 mL | INTRAVENOUS | Status: DC | PRN

## 2018-07-15 MED ORDER — SODIUM CHLORIDE 0.9% FLUSH
3.0000 mL | Freq: Two times a day (BID) | INTRAVENOUS | Status: DC
  Administered 2018-07-15 – 2018-07-17 (×4): 3 mL via INTRAVENOUS

## 2018-07-15 MED ORDER — HEPARIN SODIUM (PORCINE) 1000 UNIT/ML IJ SOLN
INTRAMUSCULAR | Status: DC | PRN
  Administered 2018-07-15: 4000 [IU] via INTRAVENOUS

## 2018-07-15 MED ORDER — SODIUM CHLORIDE 0.9 % IV SOLN
INTRAVENOUS | Status: AC
  Administered 2018-07-15: 13:00:00 via INTRAVENOUS

## 2018-07-15 MED ORDER — HYDRALAZINE HCL 20 MG/ML IJ SOLN: 10.0000 mg | INTRAMUSCULAR | Status: AC | PRN

## 2018-07-15 MED ORDER — ONDANSETRON HCL 4 MG/2ML IJ SOLN: 4.0000 mg | Freq: Four times a day (QID) | INTRAMUSCULAR | Status: DC | PRN

## 2018-07-15 MED ORDER — MIDAZOLAM HCL 2 MG/2ML IJ SOLN
INTRAMUSCULAR | Status: DC | PRN
  Administered 2018-07-15: 2 mg via INTRAVENOUS

## 2018-07-15 MED ORDER — ADENOSINE 6 MG/2ML IV SOLN
INTRAVENOUS | Status: AC
  Filled 2018-07-15: qty 2

## 2018-07-15 MED ORDER — LIDOCAINE HCL (PF) 1 % IJ SOLN
INTRAMUSCULAR | Status: AC
  Filled 2018-07-15: qty 30

## 2018-07-15 MED ORDER — FENTANYL CITRATE (PF) 100 MCG/2ML IJ SOLN
INTRAMUSCULAR | Status: AC
  Filled 2018-07-15: qty 2

## 2018-07-15 MED ORDER — VERAPAMIL HCL 2.5 MG/ML IV SOLN
INTRAVENOUS | Status: DC | PRN
  Administered 2018-07-15 (×2): via INTRA_ARTERIAL

## 2018-07-15 MED ORDER — HEPARIN (PORCINE) IN NACL 1000-0.9 UT/500ML-% IV SOLN
INTRAVENOUS | Status: AC
  Filled 2018-07-15: qty 1000

## 2018-07-15 MED ORDER — FENTANYL CITRATE (PF) 100 MCG/2ML IJ SOLN
INTRAMUSCULAR | Status: DC | PRN
  Administered 2018-07-15: 25 ug via INTRAVENOUS

## 2018-07-15 SURGICAL SUPPLY — 10 items
CATH 5FR JL3.5 JR4 ANG PIG MP (CATHETERS) ×1 IMPLANT
CATH INFINITI 5 FR 3DRC (CATHETERS) ×1 IMPLANT
DEVICE RAD COMP TR BAND LRG (VASCULAR PRODUCTS) ×1 IMPLANT
GLIDESHEATH SLEND SS 6F .021 (SHEATH) ×1 IMPLANT
GUIDEWIRE INQWIRE 1.5J.035X260 (WIRE) IMPLANT
INQWIRE 1.5J .035X260CM (WIRE) ×2
KIT HEART LEFT (KITS) ×3 IMPLANT
PACK CARDIAC CATHETERIZATION (CUSTOM PROCEDURE TRAY) ×2 IMPLANT
TRANSDUCER W/STOPCOCK (MISCELLANEOUS) ×2 IMPLANT
TUBING CIL FLEX 10 FLL-RA (TUBING) ×2 IMPLANT

## 2018-07-15 NOTE — Progress Notes (Signed)
Progress Note  Patient Name: Nicholas FlesherJohn Mills Date of Encounter: 07/15/2018  Primary Cardiologist: No primary care provider on file.  Subjective   Feeling well this morning. No complaints overnight.   Inpatient Medications    Scheduled Meds: . aspirin EC  81 mg Oral Daily  . feeding supplement  1 Container Oral TID BM  . insulin aspart  0-15 Units Subcutaneous TID WC  . insulin aspart  0-5 Units Subcutaneous QHS  . losartan  25 mg Oral Daily  . metoprolol tartrate  25 mg Oral BID  . rosuvastatin  10 mg Oral Daily  . sodium chloride flush  3 mL Intravenous Q12H  . sodium chloride flush  3 mL Intravenous Q12H   Continuous Infusions: . sodium chloride    . sodium chloride    . sodium chloride    . sodium chloride 1 mL/kg/hr (07/15/18 0527)  . heparin 1,300 Units/hr (07/14/18 1454)   PRN Meds: sodium chloride, sodium chloride, acetaminophen, ketotifen, nitroGLYCERIN, ondansetron (ZOFRAN) IV, sodium chloride flush, sodium chloride flush   Vital Signs    Vitals:   07/14/18 0609 07/14/18 0847 07/14/18 1434 07/15/18 0558  BP: 126/84 110/87 116/61 (!) 145/81  Pulse: 64 84 63 67  Resp: 18  16 15   Temp: 98.1 F (36.7 C)  97.8 F (36.6 C) 97.8 F (36.6 C)  TempSrc: Oral  Oral Oral  SpO2: 92%  96% 98%  Weight: 74.5 kg   75.3 kg  Height:        Intake/Output Summary (Last 24 hours) at 07/15/2018 0754 Last data filed at 07/15/2018 0500 Gross per 24 hour  Intake 1050 ml  Output -  Net 1050 ml   Last 3 Weights 07/15/2018 07/14/2018 07/12/2018  Weight (lbs) 165 lb 14.4 oz 164 lb 4.8 oz 171 lb 9.6 oz  Weight (kg) 75.252 kg 74.526 kg 77.837 kg      Telemetry    SR - Personally Reviewed  ECG    N/a - Personally Reviewed  Physical Exam   GEN: No acute distress.   Neck: No JVD Cardiac: RRR, no murmurs, rubs, or gallops.  Respiratory: Clear to auscultation bilaterally. GI: Soft, nontender, non-distended  MS: No edema; No deformity. Neuro:  Nonfocal  Psych: Normal  affect   Labs    Chemistry Recent Labs  Lab 07/11/18 1927 07/12/18 0350 07/15/18 0448  NA 134* 137 142  K 4.1 4.4 4.4  CL 97* 100 105  CO2 23 25 29   GLUCOSE 140* 231* 173*  BUN 27* 26* 24*  CREATININE 1.48* 1.49* 1.31*  CALCIUM 12.2* 11.1* 10.2  GFRNONAA 48* 47* 55*  GFRAA 55* 55* >60  ANIONGAP 14 12 8      Hematology Recent Labs  Lab 07/13/18 0532 07/14/18 0444 07/15/18 0448  WBC 7.5 8.4 7.7  RBC 4.26 4.27 4.13*  HGB 14.1 14.0 13.7  HCT 40.3 40.1 38.7*  MCV 94.6 93.9 93.7  MCH 33.1 32.8 33.2  MCHC 35.0 34.9 35.4  RDW 12.0 11.9 11.8  PLT 201 204 218    Cardiac Enzymes Recent Labs  Lab 07/11/18 1927 07/11/18 2317 07/12/18 0350 07/12/18 0949  TROPONINI 4.44* 4.69* 3.91* 3.74*   No results for input(s): TROPIPOC in the last 168 hours.   BNP Recent Labs  Lab 07/11/18 2317  BNP 628.3*     DDimer No results for input(s): DDIMER in the last 168 hours.   Radiology    No results found.  Cardiac Studies   TTE: 07/12/18  IMPRESSIONS  1. Akinesis of the left ventricular, mid-apical anterior wall. The anterior septum has preserved contractility.  2. Mild hypokinesis of the left ventricular, entire inferolateral wall.  3. The left ventricle has mild-moderately reduced systolic function, with an ejection fraction of 40-45%. The cavity size was normal. Left ventricular diastolic Doppler parameters are consistent with pseudonormalization. Elevated mean left atrial  pressure.  4. The right ventricle has normal systolic function. The cavity was mildly enlarged. There is no increase in right ventricular wall thickness. Right ventricular systolic pressure is moderately elevated with an estimated pressure of 52.6 mmHg.  5. Left atrial size was mildly dilated.  6. Right atrial size was mildly dilated.  7. Mitral valve regurgitation is moderate by color flow Doppler. The MR jet is centrally-directed.  8. Tricuspid valve regurgitation is mild-moderate.  9. The  aortic valve is tricuspid. Mild thickening of the aortic valve. Mild calcification of the aortic valve. 10. The aortic root and ascending aorta are normal in size and structure.  Patient Profile     68 y.o. male with HLD and DM admitted post syncope and found to have high risk myoview with reduced LVEF and anterior wall akinesis on echo. Troponin elevated and trending down.   Assessment & Plan    1. CAD/NSTEMI: troponin peaked at 6.80, now trending down. Planned for cardiac cath today. Initially admitted following a syncopal event and found to have abnormal echo and elevated troponins. High risk stress test at OSH. On ASA, statin, BB and IV heparin.  -- The patient understands that risks included but are not limited to stroke (1 in 1000), death (1 in 1000), kidney failure [usually temporary] (1 in 500), bleeding (1 in 200), allergic reaction [possibly serious] (1 in 200).   2. DM: Metformin held on admission. Hgb A1c 8.1. -- SSI   3. New systolic HF: echo showed EF of 40-45% with akinesis in the left ventricular, mid-apical anterior wall. On BB, ARB.   4. HL: on statin, LDL 43. Trig 369. Adjustments pending cath today  For questions or updates, please contact [facility] Please consult [email or web address] for contact info under   Signed, Laverda Page, NP  07/15/2018, 7:54 AM

## 2018-07-15 NOTE — Plan of Care (Signed)
  Problem: Clinical Measurements: Goal: Ability to maintain clinical measurements within normal limits will improve Outcome: Progressing Goal: Will remain free from infection Outcome: Progressing Goal: Diagnostic test results will improve Outcome: Progressing Goal: Respiratory complications will improve Outcome: Progressing Goal: Cardiovascular complication will be avoided Outcome: Progressing   Problem: Education: Goal: Understanding of cardiac disease, CV risk reduction, and recovery process will improve Outcome: Progressing Goal: Understanding of medication regimen will improve Outcome: Progressing Goal: Individualized Educational Video(s) Outcome: Progressing   Problem: Activity: Goal: Ability to tolerate increased activity will improve Outcome: Progressing   Problem: Cardiac: Goal: Ability to achieve and maintain adequate cardiopulmonary perfusion will improve Outcome: Progressing Goal: Vascular access site(s) Level 0-1 will be maintained Outcome: Progressing   Problem: Health Behavior/Discharge Planning: Goal: Ability to safely manage health-related needs after discharge will improve Outcome: Progressing   Problem: Activity: Goal: Ability to return to baseline activity level will improve Outcome: Progressing   Problem: Cardiovascular: Goal: Ability to achieve and maintain adequate cardiovascular perfusion will improve Outcome: Progressing Goal: Vascular access site(s) Level 0-1 will be maintained Outcome: Progressing

## 2018-07-15 NOTE — Consult Note (Signed)
[address]       [place] [address]             [phone]        Pericles Carmicheal [facility] Medical Record #[id number] Date of Birth: [birth year]  Referring: Dr. Eldridge Dace, MD Primary Care: Lise Auer, MD Primary Cardiologist: Dr. 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 He states he was walking to his barn when he got dizzy, lightheaded, sweaty, and then "passed out". He "came too" about one hour later. Upon further questioning, he has noticed some generalized fatigue and some worsening of shortness of breath. He denies chest pain, nausea, LE edema, or abdominal pain. He initially called his medical doctor.  Of note, patient had a Lexiscan Cardiolite study done on 07/10/2018 at [facility]. Results of the the perfusion study showed an ejection fraction of 28% with global hypokinesia there is a small fixed defect noted in the anterior septal wall and there is ischemia noted in the anterior septal region and additional ischemia noted in inferior lateral wall mid to base.  The patient was then seen by Dr. Dulce Sellar (cardiology) in his office on 05/22.  EKG showed RBBB and he was scheduled for a cardiac catheterization as an outpatient.  Dr. Dulce Sellar also started him on a beta-blocker, placement on a statin, and continue low-dose aspirin 81 mg daily. Of note, the patient also had a Troponin drawn, which was resulted (at 6.8) after he left the office.  Patient was contact and instructed to go to [facility] ED immediately.  EKG done in ED showed normal sinus rhythm, right bundle branch block ,left posterior fascicular  block, T wave abnormality.His Troponin I max was 6.8 here. He ruled in for a NSTEMI. He underwent a cardiac catheterization earlier today. Results showed ostial left main with an 80% stenosis, proximal Circumflex with 80% stenosis, proximal RCA with 100% stenosis, and diagonal 2 with a 90% stenosis. A cardiothoracic consultation has been requested with Dr. Laneta Simmers. Currently, patient's vital signs are stable and he denies chest pain or shortness of breath. SARS coronavirus 2 was NEGATIVE on 07/11/2018.  Current Activity/ Functional Status: Patient is independent with mobility/ambulation, transfers, ADL's, IADL's.   Zubrod Score: At the time of surgery this patients most appropriate activity status/level should be described as:     0    Normal activity, no symptoms     1    Restricted in physical strenuous activity but ambulatory, able to do out light work     2    Ambulatory and capable of self care, unable to do work activities, up and about more than 50%  Of the time                                3    Only limited self care, in bed greater than 50% of waking hours     4    Completely disabled, no self care, confined to bed or chair     5    Moribund  Past Medical History:  Diagnosis Date   Diabetes mellitus without complication (HCC)    Hyperlipidemia    Hypertension     Past Surgical History:  Procedure Laterality Date   CARPAL TUNNEL RELEASE     LEFT HEART CATH AND CORONARY ANGIOGRAPHY N/A 07/15/2018   Procedure: LEFT HEART CATH AND CORONARY ANGIOGRAPHY;  Surgeon: Corky Crafts, MD;  Location: [facility] INVASIVE CV LAB;  Service: Cardiovascular;  Laterality: N/A;   VASECTOMY      Social History   Tobacco Use  Smoking Status Former Smoker. He quit about 20 years ago.  Smokeless Tobacco Never Used    Social History   Substance and Sexual Activity  Alcohol Use Not on file   Comment: beer occasionally    Allergies: No Known Allergies  Current  Facility-Administered Medications  Medication Dose Route Frequency Provider Last Rate Last Dose   0.9 %  sodium chloride infusion   Intravenous Continuous Corky Crafts, MD 75 mL/hr at 07/15/18 1231     0.9 %  sodium chloride infusion  250 mL Intravenous PRN Corky Crafts, MD       acetaminophen (TYLENOL) tablet 650 mg  650 mg Oral Q4H PRN Corky Crafts, MD       aspirin EC tablet 81 mg  81 mg Oral Daily Corky Crafts, MD   81 mg at 07/14/18 1610   feeding supplement (BOOST / RESOURCE BREEZE) liquid 1 Container  1 Container Oral TID BM Corky Crafts, MD   1 Container at 07/15/18 1237   heparin ADULT infusion 100 units/mL (25000 units/253mL sodium chloride 0.45%)  1,400 Units/hr Intravenous Continuous Earnie Larsson, RPH       hydrALAZINE (APRESOLINE) injection 10 mg  10 mg Intravenous Q20 Min PRN Corky Crafts, MD       insulin aspart (novoLOG) injection 0-15 Units  0-15 Units Subcutaneous TID WC Corky Crafts, MD   2 Units at 07/15/18 1226   insulin aspart (novoLOG) injection 0-5 Units  0-5 Units Subcutaneous QHS Corky Crafts, MD   3 Units at 07/14/18 2236   ketotifen (ZADITOR) 0.025 % ophthalmic solution 1 drop  1 drop Both Eyes BID PRN Corky Crafts, MD       labetalol (NORMODYNE) injection 10 mg  10 mg Intravenous Q10 min PRN Corky Crafts, MD       losartan (COZAAR) tablet 25 mg  25 mg Oral Daily Corky Crafts, MD   25 mg at 07/14/18 0848   metoprolol tartrate (LOPRESSOR) tablet 25 mg  25 mg Oral BID Corky Crafts, MD   25 mg at 07/14/18 2235   nitroGLYCERIN (NITROSTAT) SL tablet 0.4 mg  0.4 mg Sublingual Q5 Min x 3 PRN Corky Crafts, MD       ondansetron [facility]) injection 4 mg  4 mg Intravenous Q6H PRN Corky Crafts, MD       rosuvastatin (CRESTOR) tablet 10 mg  10 mg Oral Daily Corky Crafts, MD   10 mg at 07/14/18 0848   sodium chloride flush (NS) 0.9 % injection 3 mL   3 mL Intravenous Q12H Corky Crafts, MD       sodium chloride flush (NS) 0.9 % injection 3 mL  3 mL Intravenous PRN Corky Crafts, MD        Medications Prior to Admission  Medication Sig Dispense Refill Last Dose   Coenzyme Q10 (COQ10) 100 MG CAPS Take 100 mg by  mouth at bedtime.    07/10/2018 at pm   ketotifen (ALAWAY) 0.025 % ophthalmic solution Place 1 drop into both eyes 2 (two) times daily as needed (seasonal allergies).    3 weeks ago   lisinopril (ZESTRIL) 20 MG tablet Take 20 mg by mouth at bedtime.   07/10/2018 at pm   metFORMIN (GLUCOPHAGE-XR) 750 MG 24 hr tablet Take 750 mg by mouth 2 (two) times daily.    maybe 5/22 at am   metoprolol tartrate (LOPRESSOR) 25 MG tablet Take 1 tablet (25 mg total) by mouth 2 (two) times daily. 180 tablet 1 not yet taken   naproxen sodium (ALEVE) 220 MG tablet Take 220 mg by mouth at bedtime as needed (pain).   week ago   PSYLLIUM PO Take 5 capsules by mouth 2 (two) times a day.    07/11/2018 at am   Red Yeast Rice 600 MG CAPS Take 600 mg by mouth 2 (two) times a day.    couple weeks ago   rosuvastatin (CRESTOR) 10 MG tablet Take 1 tablet (10 mg total) by mouth daily. 90 tablet 1 not yet taken   aspirin EC 325 MG tablet Take 325 mg by mouth daily.   07/10/2018 at am   aspirin EC 81 MG tablet Take 1 tablet (81 mg total) by mouth daily.   not yet taken    Family History  Problem Relation Age of Onset   CAD-had a CABG  Mother    Valvular heart disease Mother        Valve replacement with mechanical valve    Father died in his 580's   Patient is retired from [employer] since 2001. He is divorced and has 3 children (2 sons and 1 daughter;one son lives locally and the rest are in [place])  Review of Systems:      Cardiac Review of Systems: Y or  [ N   ]= no  Chest Pain [  N  ]  Resting SOB [  N ] Exertional SOB  [N  ]  Orthopnea [ N ]   Pedal Edema Klaus.Mock[N   ]    Palpitations [ N ] Syncope  [Y  ]     General Review  of Systems: [Y] = yes [  ]=no Constitional:  fatigue [ Y ]; nausea [ N ]; night sweats [  N]; fever [ N ]; or chills [ N ]                                                                 Eye : blurred vision [ N ];  Amaurosis fugax[N  ]; Resp: cough [ N ];  wheezing[N  ];  hemoptysis[N  ];  GI:   vomiting[ N ];  melena[N  ];  hematochezia [ N ]; heartburn[ Y ];   GU: k hematuria[ N ];               Skin: rash, swelling[ N ];,  Heme/Lymph: bruising[  ];  bleeding[N  ];  anemia[  N];  Neuro: Deyanira.KussmaulTIA[N  ];    stroke[ N ];  vertigo[ N ];  seizures[ N ];     Psych:depression[ N ]; anxiety[N  ];  Endocrine: diabetes[Y  ];  thyroid dysfunction[ N ];                  Physical Exam: BP 122/76    Pulse (!) 43    Temp 97.7 F (36.5 C) (Oral)    Resp 17    Ht  (1.778 m)    Wt 75.3 kg    SpO2 98%    BMI 23.82 kg/m    General appearance: alert, cooperative and no distress Head: Normocephalic, without obvious abnormality, atraumatic Neck: no carotid bruit, no JVD and supple, symmetrical, trachea midline Resp: clear to auscultation bilaterally Cardio: RRR, no murmur GI: Soft, non tender, bowel sounds present Extremities: No LE edema. Feet bilaterally cool Neurologic: Grossly normal  Diagnostic Studies & Laboratory data:    1. Akinesis of the left ventricular, mid-apical anterior wall. The anterior septum has preserved contractility.  2. Mild hypokinesis of the left ventricular, entire inferolateral wall.  3. The left ventricle has mild-moderately reduced systolic function, with an ejection fraction of 40-45%. The cavity size was normal. Left ventricular diastolic Doppler parameters are consistent with pseudonormalization. Elevated mean left atrial  pressure.  4. The right ventricle has normal systolic function. The cavity was mildly enlarged. There is no increase in right ventricular wall thickness. Right ventricular systolic pressure is moderately elevated with an estimated pressure of  52.6 mmHg.  5. Left atrial size was mildly dilated.  6. Right atrial size was mildly dilated.  7. Mitral valve regurgitation is moderate by color flow Doppler. The MR jet is centrally-directed.  8. Tricuspid valve regurgitation is mild-moderate.  9. The aortic valve is tricuspid. Mild thickening of the aortic valve. Mild calcification of the aortic valve. 10. The aortic root and ascending aorta are normal in size and structure.  FINDINGS  Left Ventricle: The left ventricle has mild-moderately reduced systolic function, with an ejection fraction of 40-45%. The cavity size was normal. There is no increase in left ventricular wall thickness. Left ventricular diastolic Doppler parameters are  consistent with pseudonormalization. Elevated mean left atrial pressure Severe akinesis of the left ventricular, mid-apical anterior wall. Mild hypokinesis of the left ventricular, entire inferolateral wall.  Right Ventricle: The right ventricle has normal systolic function. The cavity was mildly enlarged. There is no increase in right ventricular wall thickness. Right ventricular systolic pressure is moderately elevated with an estimated pressure of 52.6  mmHg.  Left Atrium: Left atrial size was mildly dilated.  Right Atrium: Right atrial size was mildly dilated. Right atrial pressure is estimated at 3 mmHg.  Interatrial Septum: No atrial level shunt detected by color flow Doppler.  Pericardium: There is no evidence of pericardial effusion.  Mitral Valve: The mitral valve is normal in structure. Mitral valve regurgitation is moderate by color flow Doppler. The MR jet is centrally-directed.  Tricuspid Valve: The tricuspid valve is normal in structure. Tricuspid valve regurgitation is mild-moderate by color flow Doppler.  Aortic Valve: The aortic valve is tricuspid Mild thickening of the aortic valve. Mild calcification of the aortic valve. Aortic valve regurgitation was not visualized by color  flow Doppler. There is no evidence of aortic valve stenosis.  Pulmonic Valve: The pulmonic valve was normal in structure. Pulmonic valve regurgitation is not visualized by color flow Doppler.  Aorta: The aortic root and ascending aorta are normal in size and structure  Corky Crafts, MD (Primary)    Procedures   LEFT HEART CATH AND CORONARY ANGIOGRAPHY on 07/15/2018:  Conclusion     Ost LM lesion  is 80% stenosed.  Prox Cx lesion is 80% stenosed.  Prox RCA lesion is 100% stenosed. Left to right collaterals.  Prox LAD to Mid LAD lesion is 25% stenosed.  2nd Diag lesion is 90% stenosed.  LV end diastolic pressure is normal.  There is no aortic valve stenosis.   Plan for surgery consult.  Move to stepdown.  Dominance: Right  Left Main  Ost LM lesion 80% stenosed  Ost LM lesion is 80% stenosed. The lesion is eccentric, irregular and ulcerative.  Left Anterior Descending  Prox LAD to Mid LAD lesion 25% stenosed  Prox LAD to Mid LAD lesion is 25% stenosed.  Second Diagonal Branch  2nd Diag lesion 90% stenosed  2nd Diag lesion is 90% stenosed.  Left Circumflex  Prox Cx lesion 80% stenosed  Prox Cx lesion is 80% stenosed.  Right Coronary Artery  Prox RCA lesion 100% stenosed  Prox RCA lesion is 100% stenosed.  Right Posterior Descending Artery  Collaterals  RPDA filled by collaterals from 3rd Sept.    Intervention   No interventions have been documented.  Left Heart   Left Ventricle LV end diastolic pressure is normal.  Aortic Valve There is no aortic valve stenosis.  Coronary Diagrams   Diagnostic  Dominance: Right       Recent Radiology Findings:    CLINICAL DATA:  Chest pain following multiple syncopal episodes  EXAM: PORTABLE CHEST 1 VIEW  COMPARISON:  None.  FINDINGS: Cardiac shadow is mildly enlarged. Large hiatal hernia is seen. Lungs are well aerated bilaterally with mild likely chronic interstitial changes. No edematous changes  are seen. No effusion is noted. No bony abnormality is noted.  IMPRESSION: No active disease.   Electronically Signed   By: Alcide Clever M.D.   On: 07/11/2018 21:19   I have independently reviewed the above radiologic studies and discussed with the patient   Recent Lab Findings: Lab Results  Component Value Date   WBC 7.7 07/15/2018   HGB 13.7 07/15/2018   HCT 38.7 (L) 07/15/2018   PLT 218 07/15/2018   GLUCOSE 173 (H) 07/15/2018   CHOL 150 07/12/2018   TRIG 369 (H) 07/12/2018   HDL 33 (L) 07/12/2018   LDLCALC 43 07/12/2018   NA 142 07/15/2018   K 4.4 07/15/2018   CL 105 07/15/2018   CREATININE 1.31 (H) 07/15/2018   BUN 24 (H) 07/15/2018   CO2 29 07/15/2018   TSH 1.801 07/11/2018   INR 1.2 07/11/2018   HGBA1C 8.1 (H) 07/11/2018    Assessment / Plan:   1. S/p NSTEMI, coronary artery disease-on Heparin drip and Lopressor 25 mg bid. He would benefit from coronary artery bypass grafting surgery. Dr. Laneta Simmers to evaluate and is scheduled for Friday 05/29. 2. History of diabetes mellitus-he was on Metformin XR 750 mg bid prior to admission but is only on Insulin now 3. History of hyperlipidemia-on Crestor 10 mg daily 4. History of remote tobacco abuse 5. Creatinine-1.31 today. Chronic Kidney Disease   Stage I     GFR >90  Stage II    GFR 60-89  Stage IIIA GFR 45-59  Stage IIIB GFR 30-44  Stage IV   GFR 15-29  Stage V    GFR  <15  Lab Results  Component Value Date   CREATININE 1.31 (H) 07/15/2018   Estimated Creatinine Clearance: 55 mL/min (A) (by C-G formula based on SCr of 1.31 mg/dL (H)). Possibly secondary to catheterization but also on Cozaar 25 mg daily. Re check  in am  I  spent 20 minutes counseling the patient face to face.   Doree Fudge PA-C 07/15/2018 2:02 PM   Medical record reviewed, cath films and echo reviewed, patient interviewed and examined. He has had recent onset of fatigue and exertional shortness of breath and has had episodes of  "heart burn" type pain that he thought was due to his hiatal hernia. 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 He presented to [facility] and had a Lexiscan Cardiolite study done on 07/10/2018 at [facility]. Results of the the perfusion study showed an ejection fraction of 28% with global hypokinesia there is a small fixed defect noted in the anterior septal wall and there is ischemia noted in the anterior septal region and additional ischemia noted in inferior lateral wall mid to base.  The patient was then seen by Dr. Dulce Sellar in his office on 05/22, had a Troponin of 6.8 and was sent to [facility] ER for STEMI. Cath shows 80% ostial LM, 80% proximal LCX and 100% proximal RCA, 90% diagonal. Echo shows EF 40-45% with moderate MR. I agree with need for CABG. MR will need to be evaluated by TEE in the OR. I don't hear a murmur so it may be transient. I discussed the operative procedure with the patient including alternatives, benefits and risks; including but not limited to bleeding, blood transfusion, infection, stroke, myocardial infarction, graft failure, heart block requiring a permanent pacemaker, organ dysfunction, and death.  Micah Flesher understands and agrees to proceed.  Will do on Friday am.

## 2018-07-15 NOTE — Plan of Care (Signed)
  Problem: Clinical Measurements: Goal: Ability to maintain clinical measurements within normal limits will improve Outcome: Progressing Goal: Will remain free from infection Outcome: Progressing Goal: Diagnostic test results will improve Outcome: Progressing Goal: Respiratory complications will improve Outcome: Progressing Goal: Cardiovascular complication will be avoided Outcome: Progressing   Problem: Education: Goal: Understanding of cardiac disease, CV risk reduction, and recovery process will improve Outcome: Progressing Goal: Understanding of medication regimen will improve Outcome: Progressing Goal: Individualized Educational Video(s) Outcome: Progressing   Problem: Activity: Goal: Ability to tolerate increased activity will improve Outcome: Progressing   Problem: Cardiac: Goal: Ability to achieve and maintain adequate cardiopulmonary perfusion will improve Outcome: Progressing Goal: Vascular access site(s) Level 0-1 will be maintained Outcome: Progressing   Problem: Health Behavior/Discharge Planning: Goal: Ability to safely manage health-related needs after discharge will improve Outcome: Progressing   Problem: Activity: Goal: Ability to return to baseline activity level will improve Outcome: Progressing   Problem: Cardiovascular: Goal: Ability to achieve and maintain adequate cardiovascular perfusion will improve Outcome: Progressing Goal: Vascular access site(s) Level 0-1 will be maintained Outcome: Progressing   Problem: Health Behavior/Discharge Planning: Goal: Ability to safely manage health-related needs after discharge will improve Outcome: Progressing

## 2018-07-15 NOTE — Interval H&P Note (Signed)
Cath Lab Visit (complete for each Cath Lab visit)  Clinical Evaluation Leading to the Procedure:   ACS: Yes.    Non-ACS:    Anginal Classification: CCS IV  Anti-ischemic medical therapy: Minimal Therapy (1 class of medications)  Non-Invasive Test Results: No non-invasive testing performed  Prior CABG: No previous CABG      History and Physical Interval Note:  07/15/2018 9:29 AM  Nicholas Mills  has presented today for surgery, with the diagnosis of abnormal stress - syncope.  The various methods of treatment have been discussed with the patient and family. After consideration of risks, benefits and other options for treatment, the patient has consented to  Procedure(s): LEFT HEART CATH AND CORONARY ANGIOGRAPHY (N/A) as a surgical intervention.  The patient's history has been reviewed, patient examined, no change in status, stable for surgery.  I have reviewed the patient's chart and labs.  Questions were answered to the patient's satisfaction.     Lance Muss

## 2018-07-15 NOTE — Addendum Note (Signed)
Addended by: Crist Fat on: 07/15/2018 01:39 PM   Modules accepted: Orders

## 2018-07-15 NOTE — Progress Notes (Signed)
ANTICOAGULATION CONSULT NOTE - Follow Up Consult  Pharmacy Consult for Heparin Indication: NSTEMI  No Known Allergies  Patient Measurements: Height: 5\' 10"  (177.8 cm) Weight: 165 lb 14.4 oz (75.3 kg) IBW/kg (Calculated) : 73 Heparin Dosing Weight: 77.8 kg  Vital Signs: Temp: 97.8 F (36.6 C) (05/26 0558) Temp Source: Oral (05/26 0558) BP: 145/81 (05/26 0558) Pulse Rate: 67 (05/26 0558)  Labs: Recent Labs    07/12/18 0949  07/13/18 0532 07/14/18 0444 07/15/18 0448  HGB  --    < > 14.1 14.0 13.7  HCT  --   --  40.3 40.1 38.7*  PLT  --   --  201 204 218  HEPARINUNFRC  --    < > 0.32 0.31 0.24*  CREATININE  --   --   --   --  1.31*  TROPONINI 3.74*  --   --   --   --    < > = values in this interval not displayed.    Estimated Creatinine Clearance: 55 mL/min (A) (by C-G formula based on SCr of 1.31 mg/dL (H)).   Assessment: Nicholas Mills is a 68yo male admitted with NSTEMI. Pharmacy consulted for heparin gtt. Echo shows EF 40-45%  Heparin level is subtherapeutic at 0.24 on 1300 units/hr. No bleeding noted, CBC is stable.  Goal of Therapy:  Heparin level 0.3-0.7 units/ml Monitor platelets by anticoagulation protocol: Yes   Plan:  Increase heparin drip to 1400 units/hr Daily heparin level and CBC F/U after cath today  Thank you for involving pharmacy in this patient's care.  Loura Back, PharmD, BCPS Clinical Pharmacist Clinical phone for 07/15/2018 until 3p is [phone] 07/15/2018 7:54 AM  **Pharmacist phone directory can be found on [email or web address] listed under [facility]**

## 2018-07-15 NOTE — Progress Notes (Signed)
Inpatient Diabetes Program Recommendations  AACE/ADA: New Consensus Statement on Inpatient Glycemic Control (2015)  Target Ranges:  Prepandial:   less than 140 mg/dL      Peak postprandial:   less than 180 mg/dL (1-2 hours)      Critically ill patients:  140 - 180 mg/dL   Lab Results  Component Value Date   GLUCAP 124 (H) 07/15/2018   HGBA1C 8.1 (H) 07/11/2018    Review of Glycemic Control Results for Nicholas Mills, Nicholas Mills (MRN [id number]) as of 07/15/2018 13:06  Ref. Range 07/14/2018 07:30 07/14/2018 12:02 07/14/2018 16:30 07/14/2018 21:59 07/15/2018 07:40 07/15/2018 11:20  Glucose-Capillary Latest Ref Range: 70 - 99 mg/dL 786 (H) 754 (H) 492 (H) 279 (H) 158 (H) 124 (H)   Diabetes history: DM 2 Outpatient Diabetes medications: Metformin 750 mg bid Current orders for Inpatient glycemic control:  Novolog moderate tid with meals and HS  Inpatient Diabetes Program Recommendations:    A1C> goal.  May need adjustment in home medications after hospitalization.  Once eating, consider adding Novolog meal coverage 3 units tid with meals (hold if patient eats less than 50%).   Thanks,  Beryl Meager, RN, BC-ADM Inpatient Diabetes Coordinator Pager [phone] (8a-5p)

## 2018-07-15 NOTE — Plan of Care (Signed)
  Problem: Clinical Measurements: Goal: Ability to maintain clinical measurements within normal limits will improve Outcome: Progressing Goal: Will remain free from infection Outcome: Progressing Goal: Diagnostic test results will improve Outcome: Progressing Goal: Respiratory complications will improve Outcome: Progressing Goal: Cardiovascular complication will be avoided Outcome: Progressing   Problem: Education: Goal: Understanding of cardiac disease, CV risk reduction, and recovery process will improve Outcome: Progressing Goal: Understanding of medication regimen will improve Outcome: Progressing Goal: Individualized Educational Video(s) Outcome: Progressing   Problem: Activity: Goal: Ability to tolerate increased activity will improve Outcome: Progressing   Problem: Cardiac: Goal: Ability to achieve and maintain adequate cardiopulmonary perfusion will improve Outcome: Progressing Goal: Vascular access site(s) Level 0-1 will be maintained Outcome: Progressing   Problem: Health Behavior/Discharge Planning: Goal: Ability to safely manage health-related needs after discharge will improve Outcome: Progressing

## 2018-07-16 ENCOUNTER — Inpatient Hospital Stay (HOSPITAL_COMMUNITY): Payer: Medicare Other

## 2018-07-16 DIAGNOSIS — I2510 Atherosclerotic heart disease of native coronary artery without angina pectoris: Secondary | ICD-10-CM

## 2018-07-16 DIAGNOSIS — Z01810 Encounter for preprocedural cardiovascular examination: Secondary | ICD-10-CM

## 2018-07-16 LAB — BASIC METABOLIC PANEL
Anion gap: 10 (ref 5–15)
BUN: 19 mg/dL (ref 8–23)
CO2: 23 mmol/L (ref 22–32)
Calcium: 9.7 mg/dL (ref 8.9–10.3)
Chloride: 109 mmol/L (ref 98–111)
Creatinine, Ser: 1.21 mg/dL (ref 0.61–1.24)
GFR calc Af Amer: >60 mL/min (ref >60)
GFR calc non Af Amer: >60 mL/min (ref >60)
Glucose, Bld: 169 mg/dL — ABNORMAL HIGH (ref 70–99)
Potassium: 3.7 mmol/L (ref 3.5–5.1)
Sodium: 142 mmol/L (ref 135–145)

## 2018-07-16 LAB — GLUCOSE, CAPILLARY
Glucose-Capillary: 115 mg/dL — ABNORMAL HIGH (ref 70–99)
Glucose-Capillary: 141 mg/dL — ABNORMAL HIGH (ref 70–99)
Glucose-Capillary: 374 mg/dL — ABNORMAL HIGH (ref 70–99)
Glucose-Capillary: 291 mg/dL — ABNORMAL HIGH (ref 70–99)
Glucose-Capillary: 111 mg/dL — ABNORMAL HIGH (ref 70–99)

## 2018-07-16 LAB — CBC
HCT: 36.5 % — ABNORMAL LOW (ref 39.0–52.0)
Hemoglobin: 12.8 g/dL — ABNORMAL LOW (ref 13.0–17.0)
MCH: 32.9 pg (ref 26.0–34.0)
MCHC: 35.1 g/dL (ref 30.0–36.0)
MCV: 93.8 fL (ref 80.0–100.0)
Platelets: 224 10*3/uL (ref 150–400)
RBC: 3.89 MIL/uL — ABNORMAL LOW (ref 4.22–5.81)
RDW: 11.9 % (ref 11.5–15.5)
WBC: 8.4 10*3/uL (ref 4.0–10.5)
nRBC: 0 % (ref 0.0–0.2)

## 2018-07-16 LAB — HEPARIN LEVEL (UNFRACTIONATED)
Heparin Unfractionated: 0.21 IU/mL — ABNORMAL LOW (ref 0.30–0.70)
Heparin Unfractionated: 0.44 IU/mL (ref 0.30–0.70)

## 2018-07-16 MED ORDER — POTASSIUM CHLORIDE CRYS ER 20 MEQ PO TBCR
40.0000 meq | EXTENDED_RELEASE_TABLET | Freq: Once | ORAL | Status: AC
  Administered 2018-07-16: 40 meq via ORAL
  Filled 2018-07-16: qty 2

## 2018-07-16 MED FILL — Heparin Sod (Porcine)-NaCl IV Soln 1000 Unit/500ML-0.9%: INTRAVENOUS | Qty: 1000 | Status: AC

## 2018-07-16 MED FILL — Adenosine IV Soln 6 MG/2ML: INTRAVENOUS | Qty: 2 | Status: AC

## 2018-07-16 NOTE — Progress Notes (Signed)
Progress Note  Patient Name: Nicholas Mills Date of Encounter: 07/16/2018  Primary Cardiologist: Norman Herrlich, MD  Subjective   Feeling well this morning. No complaints overnight.   Inpatient Medications    Scheduled Meds: . aspirin EC  81 mg Oral Daily  . feeding supplement  1 Container Oral TID BM  . insulin aspart  0-15 Units Subcutaneous TID WC  . insulin aspart  0-5 Units Subcutaneous QHS  . losartan  25 mg Oral Daily  . metoprolol tartrate  25 mg Oral BID  . rosuvastatin  10 mg Oral Daily  . sodium chloride flush  3 mL Intravenous Q12H   Continuous Infusions: . sodium chloride    . heparin 1,550 Units/hr (07/16/18 0500)   PRN Meds: sodium chloride, acetaminophen, ketotifen, nitroGLYCERIN, ondansetron (ZOFRAN) IV, sodium chloride flush   Vital Signs    Vitals:   07/15/18 1955 07/15/18 2338 07/16/18 0345 07/16/18 0745  BP: 128/74 129/79 122/71 127/80  Pulse: 72 71 69 70  Resp: 20 15 11 14   Temp: 98.1 F (36.7 C) 98.7 F (37.1 C) 98.7 F (37.1 C) 97.7 F (36.5 C)  TempSrc: Oral Oral Oral Oral  SpO2: 98% 99% 96% 96%  Weight:      Height:        Intake/Output Summary (Last 24 hours) at 07/16/2018 0825 Last data filed at 07/16/2018 0500 Gross per 24 hour  Intake 1067.91 ml  Output 1300 ml  Net -232.09 ml   Last 3 Weights 07/15/2018 07/15/2018 07/14/2018  Weight (lbs) 166 lb 0.1 oz 165 lb 14.4 oz 164 lb 4.8 oz  Weight (kg) 75.3 kg 75.252 kg 74.526 kg      Telemetry    SR with PACs - Personally Reviewed  ECG    NSR with RBBB. ST depression c/w anterolateral ischemia - Personally Reviewed  Physical Exam   GEN: No acute distress.   Neck: No JVD Cardiac: RRR, no murmurs, rubs, or gallops.  Respiratory: Clear to auscultation bilaterally. GI: Soft, nontender, non-distended  MS: No edema; No deformity. No radial site hematoma. Neuro:  Nonfocal  Psych: Normal affect   Labs    Chemistry Recent Labs  Lab 07/12/18 0350 07/15/18 0448 07/16/18 0253   NA 137 142 142  K 4.4 4.4 3.7  CL 100 105 109  CO2 25 29 23   GLUCOSE 231* 173* 169*  BUN 26* 24* 19  CREATININE 1.49* 1.31* 1.21  CALCIUM 11.1* 10.2 9.7  GFRNONAA 47* 55* >60  GFRAA 55* >60 >60  ANIONGAP 12 8 10      Hematology Recent Labs  Lab 07/14/18 0444 07/15/18 0448 07/16/18 0253  WBC 8.4 7.7 8.4  RBC 4.27 4.13* 3.89*  HGB 14.0 13.7 12.8*  HCT 40.1 38.7* 36.5*  MCV 93.9 93.7 93.8  MCH 32.8 33.2 32.9  MCHC 34.9 35.4 35.1  RDW 11.9 11.8 11.9  PLT 204 218 224    Cardiac Enzymes Recent Labs  Lab 07/11/18 1927 07/11/18 2317 07/12/18 0350 07/12/18 0949  TROPONINI 4.44* 4.69* 3.91* 3.74*   No results for input(s): TROPIPOC in the last 168 hours.   BNP Recent Labs  Lab 07/11/18 2317  BNP 628.3*     DDimer No results for input(s): DDIMER in the last 168 hours.   Radiology    No results found.  Cardiac Studies   TTE: 07/12/18  IMPRESSIONS    1. Akinesis of the left ventricular, mid-apical anterior wall. The anterior septum has preserved contractility.  2. Mild hypokinesis of the  left ventricular, entire inferolateral wall.  3. The left ventricle has mild-moderately reduced systolic function, with an ejection fraction of 40-45%. The cavity size was normal. Left ventricular diastolic Doppler parameters are consistent with pseudonormalization. Elevated mean left atrial  pressure.  4. The right ventricle has normal systolic function. The cavity was mildly enlarged. There is no increase in right ventricular wall thickness. Right ventricular systolic pressure is moderately elevated with an estimated pressure of 52.6 mmHg.  5. Left atrial size was mildly dilated.  6. Right atrial size was mildly dilated.  7. Mitral valve regurgitation is moderate by color flow Doppler. The MR jet is centrally-directed.  8. Tricuspid valve regurgitation is mild-moderate.  9. The aortic valve is tricuspid. Mild thickening of the aortic valve. Mild calcification of the aortic  valve. 10. The aortic root and ascending aorta are normal in size and structure.  Cardiac cath: Procedures   LEFT HEART CATH AND CORONARY ANGIOGRAPHY  Conclusion     Ost LM lesion is 80% stenosed.  Prox Cx lesion is 80% stenosed.  Prox RCA lesion is 100% stenosed. Left to right collaterals.  Prox LAD to Mid LAD lesion is 25% stenosed.  2nd Diag lesion is 90% stenosed.  LV end diastolic pressure is normal.  There is no aortic valve stenosis.   Plan for surgery consult.  Move to stepdown.      Patient Profile     68 y.o. male with HLD and DM admitted post syncope and found to have high risk myoview with reduced LVEF and anterior wall akinesis on echo. Troponin elevated and trending down.   Assessment & Plan    1. CAD/NSTEMI: troponin peaked at 6.80, now trending down.  Initially admitted following a syncopal event and found to have abnormal echo and elevated troponins. High risk stress test at OSH. On ASA, statin, BB and IV heparin. Cardiac cath demonstrates occluded RCA and high grade Left main stenosis. Good targets. Seen by Dr. Laneta SimmersBartle and plan for CABG on Friday.  2. DM: Metformin held on admission. Hgb A1c 8.1. -- SSI   3. New systolic HF: echo showed EF of 40-45% with akinesis in the left ventricular, mid-apical anterior wall. On BB, ARB.   4. HL: on statin, LDL 43. Trig 369. On Crestor 10 mg  5. Acute on CKD. Creatinine improved 1.49>>1.2.   For questions or updates, please contact [facility] Please consult [email or web address] for contact info under   Signed, Stepen Prins [place], MD  07/16/2018, 8:25 AM

## 2018-07-16 NOTE — Progress Notes (Signed)
ANTICOAGULATION CONSULT NOTE - Follow Up Consult  Pharmacy Consult for Heparin Indication: NSTEMI  No Known Allergies  Patient Measurements: Height: 5\' 10"  (177.8 cm) Weight: 166 lb 0.1 oz (75.3 kg) IBW/kg (Calculated) : 73 Heparin Dosing Weight: 77.8 kg  Vital Signs: Temp: 98.7 F (37.1 C) (05/27 0345) Temp Source: Oral (05/26 2338) BP: 122/71 (05/27 0345) Pulse Rate: 69 (05/27 0345)  Labs: Recent Labs    07/14/18 0444 07/15/18 0448 07/16/18 0253  HGB 14.0 13.7 12.8*  HCT 40.1 38.7* 36.5*  PLT 204 218 224  HEPARINUNFRC 0.31 0.24* 0.21*  CREATININE  --  1.31* 1.21    Estimated Creatinine Clearance: 59.5 mL/min (by C-G formula based on SCr of 1.21 mg/dL).   Assessment: Nicholas Mills is a 68yo male admitted with NSTEMI. Pharmacy consulted for heparin gtt. Echo shows EF 40-45%  Heparin restarted at 1915 on 5/26 post cath - heparin level came back this morning subtherapeutic at 0.21, on 1400 units/hr. Hgb 12.8, plt 224. No s/sx of bleeding. No infusion issues.    Goal of Therapy:  Heparin level 0.3-0.7 units/ml Monitor platelets by anticoagulation protocol: Yes   Plan:  Increase heparin drip to 1550 units/hr - order level in 6 hours Daily heparin level and CBC F/u plan for CABG on Friday   Thank you for involving pharmacy in this patient's care.  Sherron Monday, PharmD, BCCCP Clinical Pharmacist  Pager: [phone] Phone: [phone] **Pharmacist phone directory can be found on [email or web address] listed under [facility]**

## 2018-07-16 NOTE — Progress Notes (Signed)
1 Day Post-Op Procedure(s) (LRB): LEFT HEART CATH AND CORONARY ANGIOGRAPHY (N/A) Subjective:  He feels well overall. Had brief chest discomfort this am lasting only a minute or so. Thought it might be reflux. He is on a heparin drip.  Objective: Vital signs in last 24 hours: Temp:  [97.7 F (36.5 C)-98.7 F (37.1 C)] 98.2 F (36.8 C) (05/27 1030) Pulse Rate:  [43-87] 87 (05/27 0949) Cardiac Rhythm: Normal sinus rhythm (05/27 0745) Resp:  [11-20] 14 (05/27 0745) BP: (111-129)/(71-80) 127/80 (05/27 0745) SpO2:  [91 %-99 %] 96 % (05/27 0745)  Hemodynamic parameters for last 24 hours:    Intake/Output from previous day: 05/26 0701 - 05/27 0700 In: 1067.9 [P.O.:600; I.V.:467.9] Out: 1300 [Urine:1300] Intake/Output this shift: Total I/O In: 695 [P.O.:692; I.V.:3] Out: -   General appearance: alert and cooperative Heart: regular rate and rhythm, S1, S2 normal, no murmur, click, rub or gallop Lungs: clear to auscultation bilaterally  Lab Results: Recent Labs    07/15/18 0448 07/16/18 0253  WBC 7.7 8.4  HGB 13.7 12.8*  HCT 38.7* 36.5*  PLT 218 224   BMET:  Recent Labs    07/15/18 0448 07/16/18 0253  NA 142 142  K 4.4 3.7  CL 105 109  CO2 29 23  GLUCOSE 173* 169*  BUN 24* 19  CREATININE 1.31* 1.21  CALCIUM 10.2 9.7    PT/INR: No results for input(s): LABPROT, INR in the last 72 hours. ABG No results found for: PHART, HCO3, TCO2, ACIDBASEDEF, O2SAT CBG (last 3)  Recent Labs    07/15/18 2141 07/16/18 0613 07/16/18 1132  GLUCAP 256* 141* 374*    Assessment/Plan:  LM and severe multi-vessel CAD. Plan CABG Friday. Continue heparin. Encouraged to let nurse know if he has any further chest discomfort.  LOS: 5 days    Alleen Borne 07/16/2018

## 2018-07-16 NOTE — Progress Notes (Signed)
ANTICOAGULATION CONSULT NOTE - Follow Up Consult  Pharmacy Consult for Heparin Indication: NSTEMI  No Known Allergies  Patient Measurements: Height: 5\' 10"  (177.8 cm) Weight: 166 lb 0.1 oz (75.3 kg) IBW/kg (Calculated) : 73 Heparin Dosing Weight: 77.8 kg  Vital Signs: Temp: 98.2 F (36.8 C) (05/27 1030) Temp Source: Oral (05/27 1030) BP: 127/80 (05/27 0745) Pulse Rate: 87 (05/27 0949)  Labs: Recent Labs    07/14/18 0444 07/15/18 0448 07/16/18 0253 07/16/18 0928  HGB 14.0 13.7 12.8*  --   HCT 40.1 38.7* 36.5*  --   PLT 204 218 224  --   HEPARINUNFRC 0.31 0.24* 0.21* 0.44  CREATININE  --  1.31* 1.21  --     Estimated Creatinine Clearance: 59.5 mL/min (by C-G formula based on SCr of 1.21 mg/dL).   Assessment: Nicholas Mills is a 68yo male admitted with NSTEMI. Pharmacy consulted for heparin gtt. Echo shows EF 40-45%  Heparin restarted at 1915 on 5/26 post cath - heparin level 0.44 at goal on heparin drip 1550 uts/hr. Hgb 12.8, plt 224.- stable  No s/sx of bleeding. No infusion issues.    Goal of Therapy:  Heparin level 0.3-0.7 units/ml Monitor platelets by anticoagulation protocol: Yes   Plan:  Continue heparin drip 1550 units/hr Daily heparin level and CBC F/u plan for CABG on Friday    Leota Sauers Pharm.D. CPP, BCPS Clinical Pharmacist [phone] 07/16/2018 12:45 PM

## 2018-07-16 NOTE — Plan of Care (Signed)
  Problem: Clinical Measurements: Goal: Ability to maintain clinical measurements within normal limits will improve Outcome: Progressing Goal: Will remain free from infection Outcome: Progressing Goal: Diagnostic test results will improve Outcome: Progressing Goal: Respiratory complications will improve Outcome: Progressing Goal: Cardiovascular complication will be avoided Outcome: Progressing   Problem: Education: Goal: Understanding of cardiac disease, CV risk reduction, and recovery process will improve Outcome: Progressing Goal: Understanding of medication regimen will improve Outcome: Progressing Goal: Individualized Educational Video(s) Outcome: Progressing   Problem: Activity: Goal: Ability to tolerate increased activity will improve Outcome: Progressing   Problem: Cardiac: Goal: Ability to achieve and maintain adequate cardiopulmonary perfusion will improve Outcome: Progressing Goal: Vascular access site(s) Level 0-1 will be maintained Outcome: Progressing   Problem: Health Behavior/Discharge Planning: Goal: Ability to safely manage health-related needs after discharge will improve Outcome: Progressing   Problem: Activity: Goal: Ability to return to baseline activity level will improve Outcome: Completed/Met   Problem: Cardiovascular: Goal: Ability to achieve and maintain adequate cardiovascular perfusion will improve Outcome: Completed/Met Goal: Vascular access site(s) Level 0-1 will be maintained Outcome: Completed/Met   Problem: Health Behavior/Discharge Planning: Goal: Ability to safely manage health-related needs after discharge will improve Outcome: Progressing

## 2018-07-16 NOTE — Progress Notes (Signed)
Pre CABG ultrasound testing       has been completed. Preliminary results can be found under CV proc through chart review. Ahmeer Tuman, BS, RDMS, RVT   

## 2018-07-16 NOTE — Progress Notes (Signed)
Discussed sternal precautions, mobility importance, IS (+2500 mL), and d/c planning with pt. Good reception, [place]. His family will be with him at d/c. Gave educational materials and video to watch. Will not ambulate due to 80 LM. [id number] Ethelda Chick CES, ACSM 10:27 AM 07/16/2018

## 2018-07-17 LAB — SURGICAL PCR SCREEN
MRSA, PCR: NEGATIVE
Staphylococcus aureus: POSITIVE — AB

## 2018-07-17 LAB — CBC
HCT: 36.9 % — ABNORMAL LOW (ref 39.0–52.0)
Hemoglobin: 13.1 g/dL (ref 13.0–17.0)
MCH: 32.8 pg (ref 26.0–34.0)
MCHC: 35.5 g/dL (ref 30.0–36.0)
MCV: 92.5 fL (ref 80.0–100.0)
Platelets: 224 10*3/uL (ref 150–400)
RBC: 3.99 MIL/uL — ABNORMAL LOW (ref 4.22–5.81)
RDW: 11.7 % (ref 11.5–15.5)
WBC: 8.9 10*3/uL (ref 4.0–10.5)
nRBC: 0 % (ref 0.0–0.2)

## 2018-07-17 LAB — BLOOD GAS, ARTERIAL
Acid-base deficit: 2.4 mmol/L — ABNORMAL HIGH (ref 0.0–2.0)
Bicarbonate: 20.9 mmol/L (ref 20.0–28.0)
Drawn by: 414221
FIO2: 21
O2 Saturation: 96.4 %
Patient temperature: 98.4
pCO2 arterial: 30.1 mmHg — ABNORMAL LOW (ref 32.0–48.0)
pH, Arterial: 7.455 — ABNORMAL HIGH (ref 7.350–7.450)
pO2, Arterial: 85 mmHg (ref 83.0–108.0)

## 2018-07-17 LAB — URINALYSIS, ROUTINE W REFLEX MICROSCOPIC
Bacteria, UA: NONE SEEN
Bilirubin Urine: NEGATIVE
Glucose, UA: >=500 mg/dL — AB
Ketones, ur: NEGATIVE mg/dL
Leukocytes,Ua: NEGATIVE
Nitrite: NEGATIVE
Protein, ur: NEGATIVE mg/dL
Specific Gravity, Urine: 1.009 (ref 1.005–1.030)
pH: 5 (ref 5.0–8.0)

## 2018-07-17 LAB — HEPARIN LEVEL (UNFRACTIONATED): Heparin Unfractionated: 0.53 IU/mL (ref 0.30–0.70)

## 2018-07-17 LAB — TYPE AND SCREEN
ABO/RH(D): O NEG
Antibody Screen: NEGATIVE

## 2018-07-17 LAB — APTT: aPTT: 88 seconds — ABNORMAL HIGH (ref 24–36)

## 2018-07-17 LAB — BASIC METABOLIC PANEL
Anion gap: 12 (ref 5–15)
BUN: 17 mg/dL (ref 8–23)
CO2: 21 mmol/L — ABNORMAL LOW (ref 22–32)
Calcium: 9.5 mg/dL (ref 8.9–10.3)
Chloride: 107 mmol/L (ref 98–111)
Creatinine, Ser: 1.19 mg/dL (ref 0.61–1.24)
GFR calc Af Amer: >60 mL/min (ref >60)
GFR calc non Af Amer: >60 mL/min (ref >60)
Glucose, Bld: 135 mg/dL — ABNORMAL HIGH (ref 70–99)
Potassium: 4.1 mmol/L (ref 3.5–5.1)
Sodium: 140 mmol/L (ref 135–145)

## 2018-07-17 LAB — PROTIME-INR
INR: 1.1 (ref 0.8–1.2)
Prothrombin Time: 14.5 seconds (ref 11.4–15.2)

## 2018-07-17 LAB — GLUCOSE, CAPILLARY
Glucose-Capillary: 126 mg/dL — ABNORMAL HIGH (ref 70–99)
Glucose-Capillary: 254 mg/dL — ABNORMAL HIGH (ref 70–99)
Glucose-Capillary: 300 mg/dL — ABNORMAL HIGH (ref 70–99)
Glucose-Capillary: 274 mg/dL — ABNORMAL HIGH (ref 70–99)

## 2018-07-17 LAB — ABO/RH: ABO/RH(D): O NEG

## 2018-07-17 MED ORDER — MUPIROCIN 2 % EX OINT
1.0000 "application " | TOPICAL_OINTMENT | Freq: Two times a day (BID) | CUTANEOUS | Status: AC
  Administered 2018-07-17 – 2018-07-22 (×9): 1 via NASAL
  Filled 2018-07-17 (×3): qty 22

## 2018-07-17 MED ORDER — PHENYLEPHRINE HCL-NACL 20-0.9 MG/250ML-% IV SOLN
30.0000 ug/min | INTRAVENOUS | Status: DC
  Filled 2018-07-17: qty 250

## 2018-07-17 MED ORDER — NITROGLYCERIN IN D5W 200-5 MCG/ML-% IV SOLN
2.0000 ug/min | INTRAVENOUS | Status: DC
  Filled 2018-07-17: qty 250

## 2018-07-17 MED ORDER — MILRINONE LACTATE IN DEXTROSE 20-5 MG/100ML-% IV SOLN
0.3000 ug/kg/min | INTRAVENOUS | Status: DC
  Filled 2018-07-17: qty 100

## 2018-07-17 MED ORDER — CHLORHEXIDINE GLUCONATE 0.12 % MT SOLN
15.0000 mL | Freq: Once | OROMUCOSAL | Status: AC
  Administered 2018-07-18: 15 mL via OROMUCOSAL
  Filled 2018-07-17: qty 15

## 2018-07-17 MED ORDER — DIAZEPAM 5 MG PO TABS
5.0000 mg | ORAL_TABLET | Freq: Once | ORAL | Status: AC
  Administered 2018-07-18: 5 mg via ORAL
  Filled 2018-07-17: qty 1

## 2018-07-17 MED ORDER — DOPAMINE-DEXTROSE 3.2-5 MG/ML-% IV SOLN
0.0000 ug/kg/min | INTRAVENOUS | Status: DC
  Filled 2018-07-17: qty 250

## 2018-07-17 MED ORDER — TRANEXAMIC ACID (OHS) PUMP PRIME SOLUTION
2.0000 mg/kg | INTRAVENOUS | Status: DC
  Filled 2018-07-17: qty 1.51

## 2018-07-17 MED ORDER — CHLORHEXIDINE GLUCONATE CLOTH 2 % EX PADS: 6.0000 | MEDICATED_PAD | Freq: Once | CUTANEOUS | Status: AC

## 2018-07-17 MED ORDER — SODIUM CHLORIDE 0.9 % IV SOLN
INTRAVENOUS | Status: DC
  Filled 2018-07-17 (×2): qty 30

## 2018-07-17 MED ORDER — CHLORHEXIDINE GLUCONATE CLOTH 2 % EX PADS
6.0000 | MEDICATED_PAD | Freq: Once | CUTANEOUS | Status: AC
  Administered 2018-07-17: 21:00:00 6 via TOPICAL

## 2018-07-17 MED ORDER — SODIUM CHLORIDE 0.9 % IV SOLN
750.0000 mg | INTRAVENOUS | Status: AC
  Administered 2018-07-18: 12:00:00 750 mg via INTRAVENOUS
  Filled 2018-07-17: qty 750

## 2018-07-17 MED ORDER — INSULIN REGULAR(HUMAN) IN NACL 100-0.9 UT/100ML-% IV SOLN
INTRAVENOUS | Status: AC
  Administered 2018-07-18: 1.9 [IU]/h via INTRAVENOUS
  Filled 2018-07-17: qty 100

## 2018-07-17 MED ORDER — DEXMEDETOMIDINE HCL IN NACL 400 MCG/100ML IV SOLN
0.1000 ug/kg/h | INTRAVENOUS | Status: AC
  Administered 2018-07-18: 09:00:00 .2 ug/kg/h via INTRAVENOUS
  Filled 2018-07-17: qty 100

## 2018-07-17 MED ORDER — METOPROLOL TARTRATE 12.5 MG HALF TABLET
12.5000 mg | ORAL_TABLET | Freq: Once | ORAL | Status: AC
  Administered 2018-07-18: 12.5 mg via ORAL
  Filled 2018-07-17: qty 1

## 2018-07-17 MED ORDER — TRANEXAMIC ACID (OHS) BOLUS VIA INFUSION
15.0000 mg/kg | INTRAVENOUS | Status: AC
  Administered 2018-07-18: 08:00:00 1129.5 mg via INTRAVENOUS
  Filled 2018-07-17: qty 1130

## 2018-07-17 MED ORDER — MAGNESIUM SULFATE 50 % IJ SOLN
40.0000 meq | INTRAMUSCULAR | Status: DC
  Filled 2018-07-17: qty 9.85

## 2018-07-17 MED ORDER — EPINEPHRINE PF 1 MG/ML IJ SOLN
0.0000 ug/min | INTRAVENOUS | Status: DC
  Filled 2018-07-17: qty 4

## 2018-07-17 MED ORDER — POTASSIUM CHLORIDE 2 MEQ/ML IV SOLN
80.0000 meq | INTRAVENOUS | Status: DC
  Filled 2018-07-17: qty 40

## 2018-07-17 MED ORDER — SODIUM CHLORIDE 0.9 % IV SOLN
1.5000 g | INTRAVENOUS | Status: AC
  Administered 2018-07-18: 1.5 g via INTRAVENOUS
  Filled 2018-07-17: qty 1.5

## 2018-07-17 MED ORDER — TEMAZEPAM 15 MG PO CAPS: 15.0000 mg | ORAL_CAPSULE | Freq: Once | ORAL | Status: DC | PRN

## 2018-07-17 MED ORDER — NOREPINEPHRINE 4 MG/250ML-% IV SOLN
0.0000 ug/min | INTRAVENOUS | Status: DC
  Filled 2018-07-17 (×2): qty 250

## 2018-07-17 MED ORDER — PLASMA-LYTE 148 IV SOLN
INTRAVENOUS | Status: DC
  Filled 2018-07-17: qty 2.5

## 2018-07-17 MED ORDER — BISACODYL 5 MG PO TBEC
5.0000 mg | DELAYED_RELEASE_TABLET | Freq: Once | ORAL | Status: AC
  Administered 2018-07-17: 5 mg via ORAL
  Filled 2018-07-17: qty 1

## 2018-07-17 MED ORDER — VANCOMYCIN HCL 10 G IV SOLR
1250.0000 mg | INTRAVENOUS | Status: AC
  Administered 2018-07-18: 06:00:00 1250 mg via INTRAVENOUS
  Filled 2018-07-17: qty 1250

## 2018-07-17 MED ORDER — TRANEXAMIC ACID 1000 MG/10ML IV SOLN
1.5000 mg/kg/h | INTRAVENOUS | Status: AC
  Administered 2018-07-18: 1.5 mg/kg/h via INTRAVENOUS
  Filled 2018-07-17: qty 25

## 2018-07-17 NOTE — Plan of Care (Signed)
  Problem: Clinical Measurements: Goal: Ability to maintain clinical measurements within normal limits will improve Outcome: Progressing Goal: Will remain free from infection Outcome: Progressing Goal: Diagnostic test results will improve Outcome: Progressing Goal: Respiratory complications will improve Outcome: Progressing Goal: Cardiovascular complication will be avoided Outcome: Progressing   Problem: Education: Goal: Understanding of cardiac disease, CV risk reduction, and recovery process will improve Outcome: Progressing Goal: Understanding of medication regimen will improve Outcome: Progressing Goal: Individualized Educational Video(s) Outcome: Progressing   Problem: Activity: Goal: Ability to tolerate increased activity will improve Outcome: Progressing   Problem: Cardiac: Goal: Ability to achieve and maintain adequate cardiopulmonary perfusion will improve Outcome: Progressing Goal: Vascular access site(s) Level 0-1 will be maintained Outcome: Progressing   Problem: Health Behavior/Discharge Planning: Goal: Ability to safely manage health-related needs after discharge will improve Outcome: Progressing   Problem: Health Behavior/Discharge Planning: Goal: Ability to safely manage health-related needs after discharge will improve Outcome: Progressing

## 2018-07-17 NOTE — Progress Notes (Signed)
Progress Note  Patient Name: Nicholas Mills Date of Encounter: 07/17/2018  Primary Cardiologist: Norman Herrlich, MD  Subjective   Feeling well this morning. No complaints overnight.   Inpatient Medications    Scheduled Meds: . aspirin EC  81 mg Oral Daily  . feeding supplement  1 Container Oral TID BM  . insulin aspart  0-15 Units Subcutaneous TID WC  . insulin aspart  0-5 Units Subcutaneous QHS  . losartan  25 mg Oral Daily  . metoprolol tartrate  25 mg Oral BID  . rosuvastatin  10 mg Oral Daily  . sodium chloride flush  3 mL Intravenous Q12H   Continuous Infusions: . sodium chloride    . heparin 1,550 Units/hr (07/17/18 0500)   PRN Meds: sodium chloride, acetaminophen, ketotifen, nitroGLYCERIN, ondansetron (ZOFRAN) IV, sodium chloride flush   Vital Signs    Vitals:   07/16/18 1512 07/16/18 1928 07/17/18 0011 07/17/18 0306  BP:  128/80 129/82 (!) 103/56  Pulse:  71 67 64  Resp:   19 (!) 21  Temp: 98.2 F (36.8 C) 98.1 F (36.7 C) 98 F (36.7 C) 98 F (36.7 C)  TempSrc: Oral Oral Oral Oral  SpO2:  96% 93% 95%  Weight:      Height:        Intake/Output Summary (Last 24 hours) at 07/17/2018 0807 Last data filed at 07/17/2018 0500 Gross per 24 hour  Intake 1242.41 ml  Output -  Net 1242.41 ml   Last 3 Weights 07/15/2018 07/15/2018 07/14/2018  Weight (lbs) 166 lb 0.1 oz 165 lb 14.4 oz 164 lb 4.8 oz  Weight (kg) 75.3 kg 75.252 kg 74.526 kg      Telemetry    SR with PACs - Personally Reviewed  ECG    NSR with RBBB. ST depression c/w anterolateral ischemia - Personally Reviewed  Physical Exam   GEN: No acute distress.   Neck: No JVD Cardiac: RRR, no murmurs, rubs, or gallops.  Respiratory: Clear to auscultation bilaterally. GI: Soft, nontender, non-distended  MS: No edema; No deformity. No radial site hematoma. Neuro:  Nonfocal  Psych: Normal affect   Labs    Chemistry Recent Labs  Lab 07/12/18 0350 07/15/18 0448 07/16/18 0253  NA 137 142 142   K 4.4 4.4 3.7  CL 100 105 109  CO2 GLUCOSE 231* 173* 169*  BUN 26* 24* 19  CREATININE 1.49* 1.31* 1.21  CALCIUM 11.1* 10.2 9.7  GFRNONAA 47* 55* >60  GFRAA 55* >60 >60  ANIONGAP Hematology Recent Labs  Lab 07/15/18 0448 07/16/18 0253 07/17/18 0249  WBC 7.7 8.4 8.9  RBC 4.13* 3.89* 3.99*  HGB 13.7 12.8* 13.1  HCT 38.7* 36.5* 36.9*  MCV 93.7 93.8 92.5  MCH 33.2 32.9 32.8  MCHC 35.4 35.1 35.5  RDW 11.8 11.9 11.7  PLT 218 224 224    Cardiac Enzymes Recent Labs  Lab 07/11/18 1927 07/11/18 2317 07/12/18 0350 07/12/18 0949  TROPONINI 4.44* 4.69* 3.91* 3.74*   No results for input(s): TROPIPOC in the last 168 hours.   BNP Recent Labs  Lab 07/11/18 2317  BNP 628.3*     DDimer No results for input(s): DDIMER in the last 168 hours.   Radiology    Vas US Doppler Pre Cabg  Result Date: 07/16/2018 PREOPERATIVE VASCULAR EVALUATION  Indications:  Pre CABG. Risk Factors: Hypertension, Diabetes. Performing Technologist: Jeb Levering RDMS, RVT  Examination Guidelines: A complete evaluation includes B-mode  imaging, spectral Doppler, color Doppler, and power Doppler as needed of all accessible portions of each vessel. Bilateral testing is considered an integral part of a complete examination. Limited examinations for reoccurring indications may be performed as noted.  Right Carotid Findings: +----------+--------+--------+--------+--------+------------------+           PSV cm/sEDV cm/sStenosisDescribeComments           +----------+--------+--------+--------+--------+------------------+ CCA Prox  97      14                                         +----------+--------+--------+--------+--------+------------------+ CCA Distal64      17                                         +----------+--------+--------+--------+--------+------------------+ ICA Prox  98      22      1-39%           intimal thickening  +----------+--------+--------+--------+--------+------------------+ ICA Distal87      30                                         +----------+--------+--------+--------+--------+------------------+ ECA       116     11                                         +----------+--------+--------+--------+--------+------------------+ Portions of this table do not appear on this page. +----------+--------+-------+----------------+------------+           PSV cm/sEDV cmsDescribe        Arm Pressure +----------+--------+-------+----------------+------------+ Subclavian129            Multiphasic, VAP014          +----------+--------+-------+----------------+------------+ +---------+--------+--+--------+--+---------+ VertebralPSV cm/s58EDV cm/s20Antegrade +---------+--------+--+--------+--+---------+ Left Carotid Findings: +----------+--------+--------+--------+--------+------------------------------+           PSV cm/sEDV cm/sStenosisDescribeComments                       +----------+--------+--------+--------+--------+------------------------------+ CCA Prox  84      12                                                     +----------+--------+--------+--------+--------+------------------------------+ CCA Distal109     19                                                     +----------+--------+--------+--------+--------+------------------------------+ ICA Prox  112     47      40-59%  smooth  mixed plaque mostly homogenous +----------+--------+--------+--------+--------+------------------------------+ ICA Mid   116     35                                                     +----------+--------+--------+--------+--------+------------------------------+  ICA Distal111     39                                                     +----------+--------+--------+--------+--------+------------------------------+ ECA       121     16                                                      +----------+--------+--------+--------+--------+------------------------------+ +----------+--------+--------+----------------+------------+ SubclavianPSV cm/sEDV cm/sDescribe        Arm Pressure +----------+--------+--------+----------------+------------+           224             Multiphasic, [id number]          +----------+--------+--------+----------------+------------+ +---------+--------+--+--------+-+---------+ VertebralPSV cm/s29EDV cm/s7Antegrade +---------+--------+--+--------+-+---------+  ABI Findings: +--------+------------------+-----+---------+--------+ Right   Rt Pressure (mmHg)IndexWaveform Comment  +--------+------------------+-----+---------+--------+ [id number]                    triphasic         +--------+------------------+-----+---------+--------+ ATA     136               1.00 biphasic          +--------+------------------+-----+---------+--------+ PTA     142               1.04 triphasic         +--------+------------------+-----+---------+--------+ +--------+------------------+-----+---------+-------+ Left    Lt Pressure (mmHg)IndexWaveform Comment +--------+------------------+-----+---------+-------+ [id number]                    triphasic        +--------+------------------+-----+---------+-------+ ATA     130               0.96 biphasic         +--------+------------------+-----+---------+-------+ PTA     142               1.04 triphasic        +--------+------------------+-----+---------+-------+  Right Doppler Findings: +--------+--------+-----+---------+--------+ Site    PressureIndexDoppler  Comments +--------+--------+-----+---------+--------+ [id number]          triphasic         +--------+--------+-----+---------+--------+ Radial               triphasic         +--------+--------+-----+---------+--------+ Ulnar                biphasic           +--------+--------+-----+---------+--------+  Left Doppler Findings: +--------+--------+-----+---------+--------+ Site    PressureIndexDoppler  Comments +--------+--------+-----+---------+--------+ [id number]          triphasic         +--------+--------+-----+---------+--------+ Radial               triphasic         +--------+--------+-----+---------+--------+ Ulnar                triphasic         +--------+--------+-----+---------+--------+  Summary: Right Carotid: Velocities in the right ICA are consistent with a 1-39% stenosis. Left Carotid: Velocities in the left ICA are consistent with a 40-59% stenosis. Right ABI: Resting right ankle-brachial index is within normal range. No evidence of significant right  lower extremity arterial disease. Left ABI: Resting left ankle-brachial index is within normal range. No evidence of significant left lower extremity arterial disease. Right Upper Extremity: Doppler waveform obliterate with right radial compression. Doppler waveforms decrease >50% with right ulnar compression. Left Upper Extremity: Doppler waveform obliterate with left radial compression. Doppler waveforms remain within normal limits with left ulnar compression.  Electronically signed by Coral ElseVance Brabham MD on 07/16/2018 at 1:09:08 PM.    Final     Cardiac Studies   TTE: 07/12/18  IMPRESSIONS    1. Akinesis of the left ventricular, mid-apical anterior wall. The anterior septum has preserved contractility.  2. Mild hypokinesis of the left ventricular, entire inferolateral wall.  3. The left ventricle has mild-moderately reduced systolic function, with an ejection fraction of 40-45%. The cavity size was normal. Left ventricular diastolic Doppler parameters are consistent with pseudonormalization. Elevated mean left atrial  pressure.  4. The right ventricle has normal systolic function. The cavity was mildly enlarged. There is no increase in right ventricular wall thickness.  Right ventricular systolic pressure is moderately elevated with an estimated pressure of 52.6 mmHg.  5. Left atrial size was mildly dilated.  6. Right atrial size was mildly dilated.  7. Mitral valve regurgitation is moderate by color flow Doppler. The MR jet is centrally-directed.  8. Tricuspid valve regurgitation is mild-moderate.  9. The aortic valve is tricuspid. Mild thickening of the aortic valve. Mild calcification of the aortic valve. 10. The aortic root and ascending aorta are normal in size and structure.  Cardiac cath: Procedures   LEFT HEART CATH AND CORONARY ANGIOGRAPHY  Conclusion     Ost LM lesion is 80% stenosed.  Prox Cx lesion is 80% stenosed.  Prox RCA lesion is 100% stenosed. Left to right collaterals.  Prox LAD to Mid LAD lesion is 25% stenosed.  2nd Diag lesion is 90% stenosed.  LV end diastolic pressure is normal.  There is no aortic valve stenosis.   Plan for surgery consult.  Move to stepdown.      Patient Profile     68 y.o. male with HLD and DM admitted post syncope and found to have high risk myoview with reduced LVEF and anterior wall akinesis on echo. Troponin elevated and trending down.   Assessment & Plan    1. CAD/NSTEMI: troponin peaked at 6.80, then trending down.  Initially admitted following a syncopal event and found to have abnormal echo and elevated troponins. High risk stress test at OSH. On ASA, statin, BB and IV heparin. Cardiac cath demonstrates occluded RCA and high grade Left main stenosis. Good targets. Seen by Dr. Laneta SimmersBartle and plan for CABG on Friday.  2. DM: Metformin held on admission. Hgb A1c 8.1. -- SSI   3. New systolic HF: echo showed EF of 40-45% with akinesis in the left ventricular, mid-apical anterior wall. On BB, ARB. Hopefully this will improve with revascularization. Repeat Echo 3 months.  4. HL: on statin, LDL 43. Trig 369. On Crestor 10 mg  5. Acute on CKD. Creatinine improved 1.49>>1.2.   6. Carotid  arterial disease. 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 Monitor yearly.  For questions or updates, please contact [facility] Please consult [email or web address] for contact info under   Signed, Amear Strojny [place], MD  07/17/2018, 8:07 AM

## 2018-07-17 NOTE — Progress Notes (Signed)
ANTICOAGULATION CONSULT NOTE - Follow Up Consult  Pharmacy Consult for Heparin Indication: NSTEMI  No Known Allergies  Patient Measurements: Height: 5\' 10"  (177.8 cm) Weight: 166 lb 0.1 oz (75.3 kg) IBW/kg (Calculated) : 73 Heparin Dosing Weight: 77.8 kg  Vital Signs: Temp: 98 F (36.7 C) (05/28 0306) Temp Source: Oral (05/28 0306) BP: 103/56 (05/28 0306) Pulse Rate: 64 (05/28 0306)  Labs: Recent Labs    07/15/18 0448 07/16/18 0253 07/16/18 0928 07/17/18 0249  HGB 13.7 12.8*  --  13.1  HCT 38.7* 36.5*  --  36.9*  PLT 218 224  --  224  HEPARINUNFRC 0.24* 0.21* 0.44 0.53  CREATININE 1.31* 1.21  --   --     Estimated Creatinine Clearance: 59.5 mL/min (by C-G formula based on SCr of 1.21 mg/dL).   Assessment: Nicholas Mills is a 68yo male admitted with NSTEMI. Pharmacy consulted for heparin gtt. Echo shows EF 40-45%. Plans for CABG on 5/29.  -heparin level at goal  Goal of Therapy:  Heparin level 0.3-0.7 units/ml Monitor platelets by anticoagulation protocol: Yes   Plan:  Continue heparin drip 1550 units/hr Daily heparin level and CBC  Harland German, PharmD Clinical Pharmacist **Pharmacist phone directory can now be found on [email or web address] ([email or web address]).  Listed under [facility].

## 2018-07-17 NOTE — Care Management Important Message (Signed)
Important Message  Patient Details  Name: Nicholas Mills MRN: [id number] Date of Birth: 03-Feb-1951   Medicare Important Message Given:  Yes    Dorena Bodo 07/17/2018, 2:12 PM

## 2018-07-17 NOTE — Progress Notes (Signed)
2 Days Post-Op Procedure(s) (LRB): LEFT HEART CATH AND CORONARY ANGIOGRAPHY (N/A) Subjective: No chest pain or shortness of breath.  Objective: Vital signs in last 24 hours: Temp:  [98 F (36.7 C)-98.2 F (36.8 C)] 98.1 F (36.7 C) (05/28 0817) Pulse Rate:  [64-87] 64 (05/28 0306) Cardiac Rhythm: Normal sinus rhythm;Bundle branch block (05/28 0700) Resp:  [19-21] 21 (05/28 0306) BP: (103-129)/(56-82) 103/56 (05/28 0306) SpO2:  [93 %-97 %] 94 % (05/28 0817)  Hemodynamic parameters for last 24 hours:    Intake/Output from previous day: 05/27 0701 - 05/28 0700 In: 1288.9 [P.O.:914; I.V.:374.9] Out: -  Intake/Output this shift: Total I/O In: 480 [P.O.:480] Out: -   General appearance: alert and cooperative Heart: regular rate and rhythm Lungs: clear to auscultation bilaterally  Lab Results: Recent Labs    07/16/18 0253 07/17/18 0249  WBC 8.4 8.9  HGB 12.8* 13.1  HCT 36.5* 36.9*  PLT 224 224   BMET:  Recent Labs    07/16/18 0253 07/17/18 0249  NA 142 140  K 3.7 4.1  CL 109 107  CO2 23 21*  GLUCOSE 169* 135*  BUN 19 17  CREATININE 1.21 1.19  CALCIUM 9.7 9.5    PT/INR: No results for input(s): LABPROT, INR in the last 72 hours. ABG No results found for: PHART, HCO3, TCO2, ACIDBASEDEF, O2SAT CBG (last 3)  Recent Labs    07/16/18 1618 07/16/18 2126 07/17/18 0614  GLUCAP 291* 111* 126*    Assessment/Plan: Severe multi-vessel CAD. Plan CABG in am. He has no further questions.  LOS: 6 days    Alleen Borne 07/17/2018

## 2018-07-17 NOTE — Anesthesia Preprocedure Evaluation (Addendum)
Anesthesia Evaluation  Patient identified by MRN, date of birth, ID band Patient awake    Reviewed: Allergy & Precautions, NPO status , Patient's Chart, lab work & pertinent test results  History of Anesthesia Complications Negative for: history of anesthetic complications  Airway Mallampati: III  TM Distance: >3 FB Neck ROM: Full    Dental  (+) Dental Advisory Given   Pulmonary former smoker,    breath sounds clear to auscultation       Cardiovascular hypertension, Pt. on medications + Past MI and +CHF   Rhythm:Regular Rate:Normal   '20 Carotid US - 40-59% left ICAS, 1-39% right ICAS  '20 TTE - Akinesis of the left ventricular, mid-apical anterior wall. The anterior septum has preserved contractility. Mild hypokinesis of the left ventricular, entire inferolateral wall. EF 40-45%. Left ventricular diastolic Doppler parameters are consistent with pseudonormalization. RV cavity was mildly enlarged. LA was mildly dilated. RA was mildly dilated. Moderate MR. The MR jet is centrally-directed. TR is mild-moderate. Mild thickening and calcification of the aortic valve.  '20 Cath -  Ost LM lesion is 80% stenosed.  Prox Cx lesion is 80% stenosed.  Prox RCA lesion is 100% stenosed. Left to right collaterals.  Prox LAD to Mid LAD lesion is 25% stenosed.  2nd Diag lesion is 90% stenosed.  LV end diastolic pressure is normal.  There is no aortic valve stenosis.  RBBB    Neuro/Psych negative neurological ROS  negative psych ROS   GI/Hepatic negative GI ROS, Neg liver ROS,   Endo/Other  diabetes, Type 2, Oral Hypoglycemic Agents  Renal/GU negative Renal ROS     Musculoskeletal negative musculoskeletal ROS (+)   Abdominal   Peds  Hematology negative hematology ROS (+)   Anesthesia Other Findings   Reproductive/Obstetrics                            Anesthesia Physical Anesthesia Plan  ASA:  IV  Anesthesia Plan: General   Post-op Pain Management:    Induction: Intravenous  PONV Risk Score and Plan: 2 and Treatment may vary due to age or medical condition, Ondansetron and Midazolam  Airway Management Planned: Oral ETT  Additional Equipment: Arterial line, CVP, TEE, PA Cath and Ultrasound Guidance Line Placement  Intra-op Plan:   Post-operative Plan: Post-operative intubation/ventilation  Informed Consent: I have reviewed the patients History and Physical, chart, labs and discussed the procedure including the risks, benefits and alternatives for the proposed anesthesia with the patient or authorized representative who has indicated his/her understanding and acceptance.     Dental advisory given  Plan Discussed with: CRNA and Anesthesiologist  Anesthesia Plan Comments:        Anesthesia Quick Evaluation

## 2018-07-18 ENCOUNTER — Inpatient Hospital Stay (HOSPITAL_COMMUNITY): Payer: Medicare Other

## 2018-07-18 ENCOUNTER — Inpatient Hospital Stay (HOSPITAL_COMMUNITY): Payer: Medicare Other | Admitting: Anesthesiology

## 2018-07-18 ENCOUNTER — Encounter (HOSPITAL_COMMUNITY)
Admission: EM | Disposition: A | Discharge status: Home / Self Care | Payer: Self-pay | Source: Home / Self Care | Attending: Cardiology

## 2018-07-18 ENCOUNTER — Encounter (HOSPITAL_COMMUNITY): Payer: Self-pay | Admitting: Anesthesiology

## 2018-07-18 DIAGNOSIS — I2510 Atherosclerotic heart disease of native coronary artery without angina pectoris: Secondary | ICD-10-CM

## 2018-07-18 HISTORY — PX: TEE WITHOUT CARDIOVERSION: SHX5443

## 2018-07-18 HISTORY — PX: CORONARY ARTERY BYPASS GRAFT: SHX141

## 2018-07-18 LAB — POCT I-STAT 7, (LYTES, BLD GAS, ICA,H+H)
Acid-Base Excess: 2 mmol/L (ref 0.0–2.0)
Acid-base deficit: 1 mmol/L (ref 0.0–2.0)
Acid-base deficit: 1 mmol/L (ref 0.0–2.0)
Acid-base deficit: 2 mmol/L (ref 0.0–2.0)
Acid-base deficit: 2 mmol/L (ref 0.0–2.0)
Acid-base deficit: 5 mmol/L — ABNORMAL HIGH (ref 0.0–2.0)
Bicarbonate: 25.4 mmol/L (ref 20.0–28.0)
Bicarbonate: 27.7 mmol/L (ref 20.0–28.0)
Bicarbonate: 26.6 mmol/L (ref 20.0–28.0)
Bicarbonate: 24.5 mmol/L (ref 20.0–28.0)
Bicarbonate: 25 mmol/L (ref 20.0–28.0)
Bicarbonate: 22.1 mmol/L (ref 20.0–28.0)
Bicarbonate: 19.3 mmol/L — ABNORMAL LOW (ref 20.0–28.0)
Calcium, Ion: 1.06 mmol/L — ABNORMAL LOW (ref 1.15–1.40)
Calcium, Ion: 1.12 mmol/L — ABNORMAL LOW (ref 1.15–1.40)
Calcium, Ion: 1.19 mmol/L (ref 1.15–1.40)
Calcium, Ion: 1.18 mmol/L (ref 1.15–1.40)
Calcium, Ion: 1.24 mmol/L (ref 1.15–1.40)
Calcium, Ion: 1.11 mmol/L — ABNORMAL LOW (ref 1.15–1.40)
Calcium, Ion: 1.14 mmol/L — ABNORMAL LOW (ref 1.15–1.40)
HCT: 27 % — ABNORMAL LOW (ref 39.0–52.0)
HCT: 28 % — ABNORMAL LOW (ref 39.0–52.0)
HCT: 31 % — ABNORMAL LOW (ref 39.0–52.0)
HCT: 35 % — ABNORMAL LOW (ref 39.0–52.0)
HCT: 36 % — ABNORMAL LOW (ref 39.0–52.0)
HCT: 33 % — ABNORMAL LOW (ref 39.0–52.0)
HCT: 35 % — ABNORMAL LOW (ref 39.0–52.0)
Hemoglobin: 9.2 g/dL — ABNORMAL LOW (ref 13.0–17.0)
Hemoglobin: 9.5 g/dL — ABNORMAL LOW (ref 13.0–17.0)
Hemoglobin: 10.5 g/dL — ABNORMAL LOW (ref 13.0–17.0)
Hemoglobin: 11.9 g/dL — ABNORMAL LOW (ref 13.0–17.0)
Hemoglobin: 12.2 g/dL — ABNORMAL LOW (ref 13.0–17.0)
Hemoglobin: 11.2 g/dL — ABNORMAL LOW (ref 13.0–17.0)
Hemoglobin: 11.9 g/dL — ABNORMAL LOW (ref 13.0–17.0)
O2 Saturation: 100 %
O2 Saturation: 100 %
O2 Saturation: 100 %
O2 Saturation: 96 %
O2 Saturation: 97 %
O2 Saturation: 97 %
O2 Saturation: 94 %
Patient temperature: 36.1
Patient temperature: 37.9
Patient temperature: 38.2
Potassium: 4.6 mmol/L (ref 3.5–5.1)
Potassium: 4.7 mmol/L (ref 3.5–5.1)
Potassium: 4.3 mmol/L (ref 3.5–5.1)
Potassium: 4.3 mmol/L (ref 3.5–5.1)
Potassium: 4.4 mmol/L (ref 3.5–5.1)
Potassium: 5 mmol/L (ref 3.5–5.1)
Potassium: 4.6 mmol/L (ref 3.5–5.1)
Sodium: 140 mmol/L (ref 135–145)
Sodium: 140 mmol/L (ref 135–145)
Sodium: 140 mmol/L (ref 135–145)
Sodium: 141 mmol/L (ref 135–145)
Sodium: 143 mmol/L (ref 135–145)
Sodium: 141 mmol/L (ref 135–145)
Sodium: 141 mmol/L (ref 135–145)
TCO2: 27 mmol/L (ref 22–32)
TCO2: 29 mmol/L (ref 22–32)
TCO2: 28 mmol/L (ref 22–32)
TCO2: 26 mmol/L (ref 22–32)
TCO2: 26 mmol/L (ref 22–32)
TCO2: 23 mmol/L (ref 22–32)
TCO2: 20 mmol/L — ABNORMAL LOW (ref 22–32)
pCO2 arterial: 47 mmHg (ref 32.0–48.0)
pCO2 arterial: 47.8 mmHg (ref 32.0–48.0)
pCO2 arterial: 56.9 mmHg — ABNORMAL HIGH (ref 32.0–48.0)
pCO2 arterial: 46.4 mmHg (ref 32.0–48.0)
pCO2 arterial: 48.5 mmHg — ABNORMAL HIGH (ref 32.0–48.0)
pCO2 arterial: 36.3 mmHg (ref 32.0–48.0)
pCO2 arterial: 34.3 mmHg (ref 32.0–48.0)
pH, Arterial: 7.341 — ABNORMAL LOW (ref 7.350–7.450)
pH, Arterial: 7.371 (ref 7.350–7.450)
pH, Arterial: 7.277 — ABNORMAL LOW (ref 7.350–7.450)
pH, Arterial: 7.312 — ABNORMAL LOW (ref 7.350–7.450)
pH, Arterial: 7.336 — ABNORMAL LOW (ref 7.350–7.450)
pH, Arterial: 7.396 (ref 7.350–7.450)
pH, Arterial: 7.364 (ref 7.350–7.450)
pO2, Arterial: 439 mmHg — ABNORMAL HIGH (ref 83.0–108.0)
pO2, Arterial: 478 mmHg — ABNORMAL HIGH (ref 83.0–108.0)
pO2, Arterial: 289 mmHg — ABNORMAL HIGH (ref 83.0–108.0)
pO2, Arterial: 87 mmHg (ref 83.0–108.0)
pO2, Arterial: 97 mmHg (ref 83.0–108.0)
pO2, Arterial: 92 mmHg (ref 83.0–108.0)
pO2, Arterial: 78 mmHg — ABNORMAL LOW (ref 83.0–108.0)

## 2018-07-18 LAB — CBC
HCT: 37.2 % — ABNORMAL LOW (ref 39.0–52.0)
HCT: 38 % — ABNORMAL LOW (ref 39.0–52.0)
HCT: 34.3 % — ABNORMAL LOW (ref 39.0–52.0)
Hemoglobin: 13.1 g/dL (ref 13.0–17.0)
Hemoglobin: 13 g/dL (ref 13.0–17.0)
Hemoglobin: 12 g/dL — ABNORMAL LOW (ref 13.0–17.0)
MCH: 32.9 pg (ref 26.0–34.0)
MCH: 32.7 pg (ref 26.0–34.0)
MCH: 33.5 pg (ref 26.0–34.0)
MCHC: 35.2 g/dL (ref 30.0–36.0)
MCHC: 34.2 g/dL (ref 30.0–36.0)
MCHC: 35 g/dL (ref 30.0–36.0)
MCV: 93.5 fL (ref 80.0–100.0)
MCV: 95.5 fL (ref 80.0–100.0)
MCV: 95.8 fL (ref 80.0–100.0)
Platelets: 255 10*3/uL (ref 150–400)
Platelets: 189 10*3/uL (ref 150–400)
Platelets: 188 10*3/uL (ref 150–400)
RBC: 3.98 MIL/uL — ABNORMAL LOW (ref 4.22–5.81)
RBC: 3.98 MIL/uL — ABNORMAL LOW (ref 4.22–5.81)
RBC: 3.58 MIL/uL — ABNORMAL LOW (ref 4.22–5.81)
RDW: 11.7 % (ref 11.5–15.5)
RDW: 11.8 % (ref 11.5–15.5)
RDW: 12 % (ref 11.5–15.5)
WBC: 8.7 10*3/uL (ref 4.0–10.5)
WBC: 15.1 10*3/uL — ABNORMAL HIGH (ref 4.0–10.5)
WBC: 12.9 10*3/uL — ABNORMAL HIGH (ref 4.0–10.5)
nRBC: 0 % (ref 0.0–0.2)
nRBC: 0 % (ref 0.0–0.2)
nRBC: 0 % (ref 0.0–0.2)

## 2018-07-18 LAB — POCT I-STAT 4, (NA,K, GLUC, HGB,HCT)
Glucose, Bld: 154 mg/dL — ABNORMAL HIGH (ref 70–99)
Glucose, Bld: 134 mg/dL — ABNORMAL HIGH (ref 70–99)
Glucose, Bld: 114 mg/dL — ABNORMAL HIGH (ref 70–99)
Glucose, Bld: 146 mg/dL — ABNORMAL HIGH (ref 70–99)
Glucose, Bld: 151 mg/dL — ABNORMAL HIGH (ref 70–99)
Glucose, Bld: 164 mg/dL — ABNORMAL HIGH (ref 70–99)
Glucose, Bld: 128 mg/dL — ABNORMAL HIGH (ref 70–99)
HCT: 34 % — ABNORMAL LOW (ref 39.0–52.0)
HCT: 34 % — ABNORMAL LOW (ref 39.0–52.0)
HCT: 33 % — ABNORMAL LOW (ref 39.0–52.0)
HCT: 28 % — ABNORMAL LOW (ref 39.0–52.0)
HCT: 27 % — ABNORMAL LOW (ref 39.0–52.0)
HCT: 32 % — ABNORMAL LOW (ref 39.0–52.0)
HCT: 34 % — ABNORMAL LOW (ref 39.0–52.0)
Hemoglobin: 11.6 g/dL — ABNORMAL LOW (ref 13.0–17.0)
Hemoglobin: 11.6 g/dL — ABNORMAL LOW (ref 13.0–17.0)
Hemoglobin: 11.2 g/dL — ABNORMAL LOW (ref 13.0–17.0)
Hemoglobin: 9.5 g/dL — ABNORMAL LOW (ref 13.0–17.0)
Hemoglobin: 10.9 g/dL — ABNORMAL LOW (ref 13.0–17.0)
Hemoglobin: 9.2 g/dL — ABNORMAL LOW (ref 13.0–17.0)
Hemoglobin: 11.6 g/dL — ABNORMAL LOW (ref 13.0–17.0)
Potassium: 4.1 mmol/L (ref 3.5–5.1)
Potassium: 4.5 mmol/L (ref 3.5–5.1)
Potassium: 4.6 mmol/L (ref 3.5–5.1)
Potassium: 4.6 mmol/L (ref 3.5–5.1)
Potassium: 4.3 mmol/L (ref 3.5–5.1)
Potassium: 4.7 mmol/L (ref 3.5–5.1)
Potassium: 4.2 mmol/L (ref 3.5–5.1)
Sodium: 140 mmol/L (ref 135–145)
Sodium: 139 mmol/L (ref 135–145)
Sodium: 140 mmol/L (ref 135–145)
Sodium: 140 mmol/L (ref 135–145)
Sodium: 138 mmol/L (ref 135–145)
Sodium: 141 mmol/L (ref 135–145)
Sodium: 142 mmol/L (ref 135–145)

## 2018-07-18 LAB — POCT I-STAT, CHEM 8
BUN: 15 mg/dL (ref 8–23)
Calcium, Ion: 1.16 mmol/L (ref 1.15–1.40)
Chloride: 110 mmol/L (ref 98–111)
Creatinine, Ser: 0.9 mg/dL (ref 0.61–1.24)
Glucose, Bld: 179 mg/dL — ABNORMAL HIGH (ref 70–99)
HCT: 34 % — ABNORMAL LOW (ref 39.0–52.0)
Hemoglobin: 11.6 g/dL — ABNORMAL LOW (ref 13.0–17.0)
Potassium: 4.6 mmol/L (ref 3.5–5.1)
Sodium: 141 mmol/L (ref 135–145)
TCO2: 20 mmol/L — ABNORMAL LOW (ref 22–32)

## 2018-07-18 LAB — BASIC METABOLIC PANEL
Anion gap: 10 (ref 5–15)
BUN: 18 mg/dL (ref 8–23)
CO2: 21 mmol/L — ABNORMAL LOW (ref 22–32)
Calcium: 9.5 mg/dL (ref 8.9–10.3)
Chloride: 106 mmol/L (ref 98–111)
Creatinine, Ser: 1.08 mg/dL (ref 0.61–1.24)
GFR calc Af Amer: >60 mL/min (ref >60)
GFR calc non Af Amer: >60 mL/min (ref >60)
Glucose, Bld: 148 mg/dL — ABNORMAL HIGH (ref 70–99)
Potassium: 3.9 mmol/L (ref 3.5–5.1)
Sodium: 137 mmol/L (ref 135–145)

## 2018-07-18 LAB — GLUCOSE, CAPILLARY
Glucose-Capillary: 147 mg/dL — ABNORMAL HIGH (ref 70–99)
Glucose-Capillary: 113 mg/dL — ABNORMAL HIGH (ref 70–99)
Glucose-Capillary: 108 mg/dL — ABNORMAL HIGH (ref 70–99)
Glucose-Capillary: 103 mg/dL — ABNORMAL HIGH (ref 70–99)
Glucose-Capillary: 59 mg/dL — ABNORMAL LOW (ref 70–99)

## 2018-07-18 LAB — ECHO INTRAOPERATIVE TEE
Height: 70 in
Weight: 2656.1 oz

## 2018-07-18 LAB — CREATININE, SERUM
Creatinine, Ser: 1.01 mg/dL (ref 0.61–1.24)
GFR calc Af Amer: >60 mL/min (ref >60)
GFR calc non Af Amer: >60 mL/min (ref >60)

## 2018-07-18 LAB — HEMOGLOBIN AND HEMATOCRIT, BLOOD
HCT: 29 % — ABNORMAL LOW (ref 39.0–52.0)
Hemoglobin: 10.1 g/dL — ABNORMAL LOW (ref 13.0–17.0)

## 2018-07-18 LAB — PROTIME-INR
INR: 1.2 (ref 0.8–1.2)
Prothrombin Time: 15.5 seconds — ABNORMAL HIGH (ref 11.4–15.2)

## 2018-07-18 LAB — MAGNESIUM: Magnesium: 3.1 mg/dL — ABNORMAL HIGH (ref 1.7–2.4)

## 2018-07-18 LAB — APTT: aPTT: 39 seconds — ABNORMAL HIGH (ref 24–36)

## 2018-07-18 LAB — PLATELET COUNT: Platelets: 144 10*3/uL — ABNORMAL LOW (ref 150–400)

## 2018-07-18 LAB — HEPARIN LEVEL (UNFRACTIONATED): Heparin Unfractionated: 0.48 IU/mL (ref 0.30–0.70)

## 2018-07-18 SURGERY — CORONARY ARTERY BYPASS GRAFTING (CABG)
Anesthesia: General | Site: Chest

## 2018-07-18 MED ORDER — NITROGLYCERIN IN D5W 200-5 MCG/ML-% IV SOLN: 0.0000 ug/min | INTRAVENOUS | Status: DC

## 2018-07-18 MED ORDER — SODIUM CHLORIDE 0.9% FLUSH: 3.0000 mL | INTRAVENOUS | Status: DC | PRN

## 2018-07-18 MED ORDER — THROMBIN (RECOMBINANT) 5000 UNITS EX SOLR
CUTANEOUS | Status: AC
  Filled 2018-07-18: qty 5000

## 2018-07-18 MED ORDER — MUPIROCIN 2 % EX OINT: 1.0000 "application " | TOPICAL_OINTMENT | Freq: Two times a day (BID) | CUTANEOUS | Status: DC

## 2018-07-18 MED ORDER — PHENYLEPHRINE 40 MCG/ML (10ML) SYRINGE FOR IV PUSH (FOR BLOOD PRESSURE SUPPORT)
PREFILLED_SYRINGE | INTRAVENOUS | Status: AC
  Filled 2018-07-18: qty 10

## 2018-07-18 MED ORDER — MIDAZOLAM HCL 5 MG/5ML IJ SOLN
INTRAMUSCULAR | Status: DC | PRN
  Administered 2018-07-18: 1 mg via INTRAVENOUS
  Administered 2018-07-18 (×2): 2 mg via INTRAVENOUS
  Administered 2018-07-18: 3 mg via INTRAVENOUS
  Administered 2018-07-18: 2 mg via INTRAVENOUS

## 2018-07-18 MED ORDER — METOPROLOL TARTRATE 25 MG/10 ML ORAL SUSPENSION: 12.5000 mg | Freq: Two times a day (BID) | ORAL | Status: DC

## 2018-07-18 MED ORDER — ACETAMINOPHEN 500 MG PO TABS
1000.0000 mg | ORAL_TABLET | Freq: Four times a day (QID) | ORAL | Status: DC
  Administered 2018-07-19 – 2018-07-22 (×11): 1000 mg via ORAL
  Filled 2018-07-18 (×11): qty 2

## 2018-07-18 MED ORDER — BISACODYL 10 MG RE SUPP: 10.0000 mg | Freq: Every day | RECTAL | Status: DC

## 2018-07-18 MED ORDER — PROTAMINE SULFATE 10 MG/ML IV SOLN
INTRAVENOUS | Status: DC | PRN
  Administered 2018-07-18: 270 mg via INTRAVENOUS

## 2018-07-18 MED ORDER — PROPOFOL 10 MG/ML IV BOLUS
INTRAVENOUS | Status: AC
  Filled 2018-07-18: qty 20

## 2018-07-18 MED ORDER — INSULIN REGULAR(HUMAN) IN NACL 100-0.9 UT/100ML-% IV SOLN: INTRAVENOUS | Status: DC

## 2018-07-18 MED ORDER — POTASSIUM CHLORIDE 10 MEQ/50ML IV SOLN: 10.0000 meq | INTRAVENOUS | Status: AC

## 2018-07-18 MED ORDER — FENTANYL CITRATE (PF) 250 MCG/5ML IJ SOLN
INTRAMUSCULAR | Status: AC
  Filled 2018-07-18: qty 25

## 2018-07-18 MED ORDER — CHLORHEXIDINE GLUCONATE CLOTH 2 % EX PADS: 6.0000 | MEDICATED_PAD | Freq: Every day | CUTANEOUS | Status: DC

## 2018-07-18 MED ORDER — ARTIFICIAL TEARS OPHTHALMIC OINT
TOPICAL_OINTMENT | OPHTHALMIC | Status: AC
  Filled 2018-07-18: qty 3.5

## 2018-07-18 MED ORDER — ASPIRIN 81 MG PO CHEW
324.0000 mg | CHEWABLE_TABLET | Freq: Every day | ORAL | Status: DC
  Filled 2018-07-18: qty 4

## 2018-07-18 MED ORDER — HEMOSTATIC AGENTS (NO CHARGE) OPTIME
TOPICAL | Status: DC | PRN
  Administered 2018-07-18: 1 via TOPICAL

## 2018-07-18 MED ORDER — ACETAMINOPHEN 650 MG RE SUPP
650.0000 mg | Freq: Once | RECTAL | Status: AC
  Administered 2018-07-18: 650 mg via RECTAL

## 2018-07-18 MED ORDER — VANCOMYCIN HCL IN DEXTROSE 1-5 GM/200ML-% IV SOLN
1000.0000 mg | Freq: Once | INTRAVENOUS | Status: AC
  Administered 2018-07-18: 1000 mg via INTRAVENOUS

## 2018-07-18 MED ORDER — LACTATED RINGERS IV SOLN: 500.0000 mL | Freq: Once | INTRAVENOUS | Status: DC | PRN

## 2018-07-18 MED ORDER — PLASMA-LYTE 148 IV SOLN
INTRAVENOUS | Status: DC | PRN
  Administered 2018-07-18: 07:00:00

## 2018-07-18 MED ORDER — OXYCODONE HCL 5 MG PO TABS
5.0000 mg | ORAL_TABLET | ORAL | Status: DC | PRN
  Administered 2018-07-18 – 2018-07-19 (×3): 10 mg via ORAL
  Administered 2018-07-19: 5 mg via ORAL
  Filled 2018-07-18 (×3): qty 2
  Filled 2018-07-18: qty 1

## 2018-07-18 MED ORDER — LACTATED RINGERS IV SOLN
INTRAVENOUS | Status: DC | PRN
  Administered 2018-07-18: 07:00:00 via INTRAVENOUS

## 2018-07-18 MED ORDER — DEXMEDETOMIDINE HCL IN NACL 400 MCG/100ML IV SOLN
0.0000 ug/kg/h | INTRAVENOUS | Status: DC
  Administered 2018-07-18: 0.05 ug/kg/h via INTRAVENOUS

## 2018-07-18 MED ORDER — THROMBIN 5000 UNITS EX SOLR
CUTANEOUS | Status: AC
  Filled 2018-07-18: qty 10000

## 2018-07-18 MED ORDER — SODIUM CHLORIDE 0.9% FLUSH
3.0000 mL | Freq: Two times a day (BID) | INTRAVENOUS | Status: DC
  Administered 2018-07-19 – 2018-07-20 (×3): 3 mL via INTRAVENOUS

## 2018-07-18 MED ORDER — MAGNESIUM SULFATE 4 GM/100ML IV SOLN
4.0000 g | Freq: Once | INTRAVENOUS | Status: AC
  Administered 2018-07-18: 4 g via INTRAVENOUS
  Filled 2018-07-18: qty 100

## 2018-07-18 MED ORDER — PHENYLEPHRINE HCL (PRESSORS) 10 MG/ML IV SOLN
INTRAVENOUS | Status: DC | PRN
  Administered 2018-07-18 (×2): 80 ug via INTRAVENOUS

## 2018-07-18 MED ORDER — FENTANYL CITRATE (PF) 250 MCG/5ML IJ SOLN
INTRAMUSCULAR | Status: DC | PRN
  Administered 2018-07-18: 150 ug via INTRAVENOUS
  Administered 2018-07-18: 100 ug via INTRAVENOUS
  Administered 2018-07-18: 150 ug via INTRAVENOUS
  Administered 2018-07-18: 50 ug via INTRAVENOUS
  Administered 2018-07-18: 100 ug via INTRAVENOUS
  Administered 2018-07-18: 200 ug via INTRAVENOUS
  Administered 2018-07-18 (×2): 100 ug via INTRAVENOUS
  Administered 2018-07-18: 50 ug via INTRAVENOUS
  Administered 2018-07-18: 250 ug via INTRAVENOUS

## 2018-07-18 MED ORDER — CHLORHEXIDINE GLUCONATE 0.12 % MT SOLN
15.0000 mL | OROMUCOSAL | Status: AC
  Administered 2018-07-18: 15 mL via OROMUCOSAL
  Filled 2018-07-18: qty 15

## 2018-07-18 MED ORDER — ONDANSETRON HCL 4 MG/2ML IJ SOLN: 4.0000 mg | Freq: Four times a day (QID) | INTRAMUSCULAR | Status: DC | PRN

## 2018-07-18 MED ORDER — SODIUM CHLORIDE 0.9 % IV SOLN
INTRAVENOUS | Status: DC | PRN
  Administered 2018-07-18: 08:00:00 20 ug/min via INTRAVENOUS

## 2018-07-18 MED ORDER — 0.9 % SODIUM CHLORIDE (POUR BTL) OPTIME
TOPICAL | Status: DC | PRN
  Administered 2018-07-18: 5000 mL

## 2018-07-18 MED ORDER — PANTOPRAZOLE SODIUM 40 MG PO TBEC
40.0000 mg | DELAYED_RELEASE_TABLET | Freq: Every day | ORAL | Status: DC
  Administered 2018-07-19 – 2018-07-20 (×2): 40 mg via ORAL
  Filled 2018-07-18 (×2): qty 1

## 2018-07-18 MED ORDER — HEPARIN SODIUM (PORCINE) 1000 UNIT/ML IJ SOLN
INTRAMUSCULAR | Status: AC
  Filled 2018-07-18: qty 2

## 2018-07-18 MED ORDER — TRAMADOL HCL 50 MG PO TABS: 50.0000 mg | ORAL_TABLET | ORAL | Status: DC | PRN

## 2018-07-18 MED ORDER — DEXTROSE 50 % IV SOLN
INTRAVENOUS | Status: AC
  Administered 2018-07-18: 17:00:00 16 mL
  Filled 2018-07-18: qty 50

## 2018-07-18 MED ORDER — SODIUM CHLORIDE 0.9 % IV SOLN
INTRAVENOUS | Status: DC
  Administered 2018-07-18: 14:00:00 via INTRAVENOUS

## 2018-07-18 MED ORDER — THROMBIN 5000 UNITS EX SOLR
CUTANEOUS | Status: DC | PRN
  Administered 2018-07-18: 5000 [IU] via TOPICAL

## 2018-07-18 MED ORDER — ACETAMINOPHEN 160 MG/5ML PO SOLN: 650.0000 mg | Freq: Once | ORAL | Status: AC

## 2018-07-18 MED ORDER — SODIUM CHLORIDE 0.45 % IV SOLN
INTRAVENOUS | Status: DC | PRN
  Administered 2018-07-18: 14:00:00 via INTRAVENOUS

## 2018-07-18 MED ORDER — DOCUSATE SODIUM 100 MG PO CAPS
200.0000 mg | ORAL_CAPSULE | Freq: Every day | ORAL | Status: DC
  Administered 2018-07-19 – 2018-07-20 (×2): 200 mg via ORAL
  Filled 2018-07-18 (×2): qty 2

## 2018-07-18 MED ORDER — MORPHINE SULFATE (PF) 2 MG/ML IV SOLN
1.0000 mg | INTRAVENOUS | Status: DC | PRN
  Administered 2018-07-18 (×2): 2 mg via INTRAVENOUS
  Administered 2018-07-19: 4 mg via INTRAVENOUS
  Administered 2018-07-19: 2 mg via INTRAVENOUS
  Filled 2018-07-18 (×2): qty 1
  Filled 2018-07-18: qty 2
  Filled 2018-07-18: qty 1

## 2018-07-18 MED ORDER — SODIUM CHLORIDE 0.9 % IV SOLN: 250.0000 mL | INTRAVENOUS | Status: DC

## 2018-07-18 MED ORDER — INSULIN REGULAR BOLUS VIA INFUSION
0.0000 [IU] | Freq: Three times a day (TID) | INTRAVENOUS | Status: DC
  Filled 2018-07-18: qty 10

## 2018-07-18 MED ORDER — METOPROLOL TARTRATE 5 MG/5ML IV SOLN: 2.5000 mg | INTRAVENOUS | Status: DC | PRN

## 2018-07-18 MED ORDER — HEPARIN SODIUM (PORCINE) 1000 UNIT/ML IJ SOLN
INTRAMUSCULAR | Status: DC | PRN
  Administered 2018-07-18: 10000 [IU] via INTRAVENOUS
  Administered 2018-07-18: 27000 [IU] via INTRAVENOUS

## 2018-07-18 MED ORDER — ALBUMIN HUMAN 5 % IV SOLN
250.0000 mL | INTRAVENOUS | Status: DC | PRN
  Administered 2018-07-18 – 2018-07-19 (×2): 12.5 g via INTRAVENOUS
  Filled 2018-07-18: qty 500

## 2018-07-18 MED ORDER — MIDAZOLAM HCL 2 MG/2ML IJ SOLN: 2.0000 mg | INTRAMUSCULAR | Status: DC | PRN

## 2018-07-18 MED ORDER — SODIUM CHLORIDE 0.9 % IV SOLN
1.5000 g | Freq: Two times a day (BID) | INTRAVENOUS | Status: AC
  Administered 2018-07-18 – 2018-07-20 (×4): 1.5 g via INTRAVENOUS
  Filled 2018-07-18 (×4): qty 1.5

## 2018-07-18 MED ORDER — PROTAMINE SULFATE 10 MG/ML IV SOLN
INTRAVENOUS | Status: AC
  Filled 2018-07-18: qty 25

## 2018-07-18 MED ORDER — PHENYLEPHRINE HCL-NACL 20-0.9 MG/250ML-% IV SOLN
0.0000 ug/min | INTRAVENOUS | Status: DC
  Administered 2018-07-18: 25 ug/min via INTRAVENOUS
  Filled 2018-07-18: qty 250

## 2018-07-18 MED ORDER — FAMOTIDINE IN NACL 20-0.9 MG/50ML-% IV SOLN
20.0000 mg | Freq: Two times a day (BID) | INTRAVENOUS | Status: AC
  Administered 2018-07-18 (×2): 20 mg via INTRAVENOUS
  Filled 2018-07-18: qty 50

## 2018-07-18 MED ORDER — METOPROLOL TARTRATE 12.5 MG HALF TABLET
12.5000 mg | ORAL_TABLET | Freq: Two times a day (BID) | ORAL | Status: DC
  Administered 2018-07-19 – 2018-07-20 (×3): 12.5 mg via ORAL
  Filled 2018-07-18 (×3): qty 1

## 2018-07-18 MED ORDER — ROCURONIUM BROMIDE 10 MG/ML (PF) SYRINGE
PREFILLED_SYRINGE | INTRAVENOUS | Status: AC
  Filled 2018-07-18: qty 20

## 2018-07-18 MED ORDER — ROCURONIUM BROMIDE 10 MG/ML (PF) SYRINGE
PREFILLED_SYRINGE | INTRAVENOUS | Status: DC | PRN
  Administered 2018-07-18: 100 mg via INTRAVENOUS
  Administered 2018-07-18: 50 mg via INTRAVENOUS

## 2018-07-18 MED ORDER — METOCLOPRAMIDE HCL 5 MG/ML IJ SOLN
10.0000 mg | Freq: Four times a day (QID) | INTRAMUSCULAR | Status: AC
  Administered 2018-07-18 – 2018-07-19 (×4): 10 mg via INTRAVENOUS
  Filled 2018-07-18 (×3): qty 2

## 2018-07-18 MED ORDER — LACTATED RINGERS IV SOLN
INTRAVENOUS | Status: DC
  Administered 2018-07-18: 15:00:00 via INTRAVENOUS

## 2018-07-18 MED ORDER — PROPOFOL 10 MG/ML IV BOLUS
INTRAVENOUS | Status: DC | PRN
  Administered 2018-07-18: 50 mg via INTRAVENOUS

## 2018-07-18 MED ORDER — BISACODYL 5 MG PO TBEC
10.0000 mg | DELAYED_RELEASE_TABLET | Freq: Every day | ORAL | Status: DC
  Administered 2018-07-19 – 2018-07-20 (×2): 10 mg via ORAL
  Filled 2018-07-18 (×2): qty 2

## 2018-07-18 MED ORDER — MIDAZOLAM HCL (PF) 10 MG/2ML IJ SOLN
INTRAMUSCULAR | Status: AC
  Filled 2018-07-18: qty 2

## 2018-07-18 MED ORDER — ACETAMINOPHEN 160 MG/5ML PO SOLN: 1000.0000 mg | Freq: Four times a day (QID) | ORAL | Status: DC

## 2018-07-18 MED ORDER — ASPIRIN EC 325 MG PO TBEC
325.0000 mg | DELAYED_RELEASE_TABLET | Freq: Every day | ORAL | Status: DC
  Administered 2018-07-19 – 2018-07-20 (×2): 325 mg via ORAL
  Filled 2018-07-18 (×2): qty 1

## 2018-07-18 SURGICAL SUPPLY — 106 items
BAG DECANTER FOR FLEXI CONT (MISCELLANEOUS) ×4 IMPLANT
BANDAGE ACE 4X5 VEL STRL LF (GAUZE/BANDAGES/DRESSINGS) ×4 IMPLANT
BANDAGE ACE 6X5 VEL STRL LF (GAUZE/BANDAGES/DRESSINGS) ×4 IMPLANT
BANDAGE ELASTIC 4 VELCRO ST LF (GAUZE/BANDAGES/DRESSINGS) ×4 IMPLANT
BANDAGE ELASTIC 6 VELCRO ST LF (GAUZE/BANDAGES/DRESSINGS) ×4 IMPLANT
BASKET HEART  (ORDER IN 25'S) (MISCELLANEOUS) ×1
BASKET HEART (ORDER IN 25'S) (MISCELLANEOUS) ×1
BASKET HEART (ORDER IN 25S) (MISCELLANEOUS) ×2 IMPLANT
BLADE STERNUM SYSTEM 6 (BLADE) ×4 IMPLANT
BLADE SURG 11 STRL SS (BLADE) ×4 IMPLANT
BNDG GAUZE ELAST 4 BULKY (GAUZE/BANDAGES/DRESSINGS) ×4 IMPLANT
CANISTER SUCT 3000ML PPV (MISCELLANEOUS) ×4 IMPLANT
CATH ROBINSON RED A/P 18FR (CATHETERS) ×8 IMPLANT
CATH THORACIC 28FR (CATHETERS) ×4 IMPLANT
CATH THORACIC 36FR (CATHETERS) ×4 IMPLANT
CATH THORACIC 36FR RT ANG (CATHETERS) ×4 IMPLANT
CLIP VESOCCLUDE MED 24/CT (CLIP) IMPLANT
CLIP VESOCCLUDE SM WIDE 24/CT (CLIP) ×4 IMPLANT
COVER WAND RF STERILE (DRAPES) ×4 IMPLANT
CRADLE DONUT ADULT HEAD (MISCELLANEOUS) ×4 IMPLANT
DERMABOND ADVANCED (GAUZE/BANDAGES/DRESSINGS) ×2
DERMABOND ADVANCED .7 DNX12 (GAUZE/BANDAGES/DRESSINGS) ×2 IMPLANT
DRAPE CARDIOVASCULAR INCISE (DRAPES) ×2
DRAPE SLUSH/WARMER DISC (DRAPES) ×4 IMPLANT
DRAPE SRG 135X102X78XABS (DRAPES) ×2 IMPLANT
DRSG COVADERM 4X14 (GAUZE/BANDAGES/DRESSINGS) ×4 IMPLANT
ELECT CAUTERY BLADE 6.4 (BLADE) ×4 IMPLANT
ELECT REM PT RETURN 9FT ADLT (ELECTROSURGICAL) ×8
ELECTRODE REM PT RTRN 9FT ADLT (ELECTROSURGICAL) ×4 IMPLANT
FELT TEFLON 1X6 (MISCELLANEOUS) ×8 IMPLANT
GAUZE SPONGE 4X4 12PLY STRL (GAUZE/BANDAGES/DRESSINGS) ×8 IMPLANT
GAUZE SPONGE 4X4 12PLY STRL LF (GAUZE/BANDAGES/DRESSINGS) ×8 IMPLANT
GLOVE BIO SURGEON STRL SZ 6 (GLOVE) IMPLANT
GLOVE BIO SURGEON STRL SZ 6.5 (GLOVE) ×12 IMPLANT
GLOVE BIO SURGEON STRL SZ7 (GLOVE) IMPLANT
GLOVE BIO SURGEON STRL SZ7.5 (GLOVE) IMPLANT
GLOVE BIO SURGEONS STRL SZ 6.5 (GLOVE) ×4
GLOVE BIOGEL PI IND STRL 6 (GLOVE) IMPLANT
GLOVE BIOGEL PI IND STRL 6.5 (GLOVE) IMPLANT
GLOVE BIOGEL PI IND STRL 7.0 (GLOVE) IMPLANT
GLOVE BIOGEL PI INDICATOR 6 (GLOVE)
GLOVE BIOGEL PI INDICATOR 6.5 (GLOVE)
GLOVE BIOGEL PI INDICATOR 7.0 (GLOVE)
GLOVE EUDERMIC 7 POWDERFREE (GLOVE) ×8 IMPLANT
GLOVE ORTHO TXT STRL SZ7.5 (GLOVE) IMPLANT
GOWN STRL REUS W/ TWL LRG LVL3 (GOWN DISPOSABLE) ×8 IMPLANT
GOWN STRL REUS W/ TWL XL LVL3 (GOWN DISPOSABLE) ×2 IMPLANT
GOWN STRL REUS W/TWL LRG LVL3 (GOWN DISPOSABLE) ×8
GOWN STRL REUS W/TWL XL LVL3 (GOWN DISPOSABLE) ×2
HEMOSTAT POWDER SURGIFOAM 1G (HEMOSTASIS) ×12 IMPLANT
HEMOSTAT SURGICEL 2X14 (HEMOSTASIS) ×4 IMPLANT
INSERT FOGARTY 61MM (MISCELLANEOUS) IMPLANT
INSERT FOGARTY XLG (MISCELLANEOUS) IMPLANT
KIT BASIN OR (CUSTOM PROCEDURE TRAY) ×4 IMPLANT
KIT CATH CPB BARTLE (MISCELLANEOUS) ×4 IMPLANT
KIT SUCTION CATH 14FR (SUCTIONS) ×4 IMPLANT
KIT TURNOVER KIT B (KITS) ×4 IMPLANT
KIT VASOVIEW HEMOPRO 2 VH 4000 (KITS) ×4 IMPLANT
NS IRRIG 1000ML POUR BTL (IV SOLUTION) ×20 IMPLANT
PACK E OPEN HEART (SUTURE) ×4 IMPLANT
PACK OPEN HEART (CUSTOM PROCEDURE TRAY) ×4 IMPLANT
PAD ARMBOARD 7.5X6 YLW CONV (MISCELLANEOUS) ×8 IMPLANT
PAD ELECT DEFIB RADIOL ZOLL (MISCELLANEOUS) ×4 IMPLANT
PENCIL BUTTON HOLSTER BLD 10FT (ELECTRODE) ×4 IMPLANT
PUNCH AORTIC ROTATE 4.0MM (MISCELLANEOUS) IMPLANT
PUNCH AORTIC ROTATE 4.5MM 8IN (MISCELLANEOUS) ×4 IMPLANT
PUNCH AORTIC ROTATE 5MM 8IN (MISCELLANEOUS) IMPLANT
SET CARDIOPLEGIA MPS 5001102 (MISCELLANEOUS) ×4 IMPLANT
SOLUTION ANTI FOG 6CC (MISCELLANEOUS) ×4 IMPLANT
SPONGE INTESTINAL PEANUT (DISPOSABLE) IMPLANT
SPONGE LAP 18X18 RF (DISPOSABLE) IMPLANT
SPONGE LAP 4X18 RFD (DISPOSABLE) ×4 IMPLANT
SUT BONE WAX W31G (SUTURE) ×4 IMPLANT
SUT MNCRL AB 4-0 PS2 18 (SUTURE) ×4 IMPLANT
SUT PROLENE 3 0 SH DA (SUTURE) IMPLANT
SUT PROLENE 3 0 SH1 36 (SUTURE) ×4 IMPLANT
SUT PROLENE 4 0 RB 1 (SUTURE)
SUT PROLENE 4 0 SH DA (SUTURE) IMPLANT
SUT PROLENE 4-0 RB1 .5 CRCL 36 (SUTURE) IMPLANT
SUT PROLENE 5 0 C 1 36 (SUTURE) IMPLANT
SUT PROLENE 6 0 C 1 30 (SUTURE) IMPLANT
SUT PROLENE 7 0 BV 1 (SUTURE) IMPLANT
SUT PROLENE 7 0 BV1 MDA (SUTURE) ×8 IMPLANT
SUT PROLENE 8 0 BV175 6 (SUTURE) IMPLANT
SUT SILK  1 MH (SUTURE) ×2
SUT SILK 1 MH (SUTURE) ×2 IMPLANT
SUT SILK 2 0 SH (SUTURE) IMPLANT
SUT STEEL STERNAL CCS#1 18IN (SUTURE) ×8 IMPLANT
SUT STEEL SZ 6 DBL 3X14 BALL (SUTURE) IMPLANT
SUT VIC AB 1 CTX 36 (SUTURE) ×6
SUT VIC AB 1 CTX36XBRD ANBCTR (SUTURE) ×6 IMPLANT
SUT VIC AB 2-0 CT1 27 (SUTURE) ×2
SUT VIC AB 2-0 CT1 TAPERPNT 27 (SUTURE) ×2 IMPLANT
SUT VIC AB 2-0 CTX 27 (SUTURE) IMPLANT
SUT VIC AB 3-0 SH 27 (SUTURE)
SUT VIC AB 3-0 SH 27X BRD (SUTURE) IMPLANT
SUT VIC AB 3-0 X1 27 (SUTURE) IMPLANT
SUT VICRYL 4-0 PS2 18IN ABS (SUTURE) IMPLANT
SYSTEM SAHARA CHEST DRAIN ATS (WOUND CARE) ×4 IMPLANT
TAPE CLOTH SURG 4X10 WHT LF (GAUZE/BANDAGES/DRESSINGS) ×4 IMPLANT
TOWEL GREEN STERILE (TOWEL DISPOSABLE) ×4 IMPLANT
TOWEL GREEN STERILE FF (TOWEL DISPOSABLE) ×4 IMPLANT
TRAY FOLEY SLVR 16FR TEMP STAT (SET/KITS/TRAYS/PACK) ×4 IMPLANT
TUBING LAP HI FLOW INSUFFLATIO (TUBING) ×4 IMPLANT
UNDERPAD 30X30 (UNDERPADS AND DIAPERS) ×4 IMPLANT
WATER STERILE IRR 1000ML POUR (IV SOLUTION) ×8 IMPLANT

## 2018-07-18 NOTE — Progress Notes (Signed)
Patient ID: Nicholas Mills, male   DOB: [birth year], 68 y.o.   MRN: [id number]  TCTS Evening Rounds:   Hemodynamically stable  CI = 2.6  Has started to wake up on vent. Urine output good  CT output low  CBC    Component Value Date/Time   WBC 15.1 (H) 07/18/2018 1302   RBC 3.98 (L) 07/18/2018 1302   HGB 11.9 (L) 07/18/2018 1348   HGB 13.9 07/11/2018 1421   HCT 35.0 (L) 07/18/2018 1348   HCT 39.9 07/11/2018 1421   PLT 189 07/18/2018 1302   PLT 198 07/11/2018 1421   MCV 95.5 07/18/2018 1302   MCV 100 (H) 07/11/2018 1421   MCH 32.7 07/18/2018 1302   MCHC 34.2 07/18/2018 1302   RDW 11.8 07/18/2018 1302   RDW 12.2 07/11/2018 1421     BMET    Component Value Date/Time   NA 141 07/18/2018 1348   NA 137 07/11/2018 1421   K 4.3 07/18/2018 1348   CL 106 07/18/2018 0246   CO2 21 (L) 07/18/2018 0246   GLUCOSE 128 (H) 07/18/2018 1320   BUN 18 07/18/2018 0246   BUN 26 07/11/2018 1421   CREATININE 1.08 07/18/2018 0246   CALCIUM 9.5 07/18/2018 0246   GFRNONAA >60 07/18/2018 0246   GFRAA >60 07/18/2018 0246     A/P:  Stable postop course. Continue current plans. Wean vent as tolerated.

## 2018-07-18 NOTE — Anesthesia Procedure Notes (Signed)
Central Venous Catheter Insertion Performed by: Beryle Lathe, MD, anesthesiologist Start/End5/29/2020 7:02 AM, 07/18/2018 7:14 AM Preanesthetic checklist: patient identified, IV checked, risks and benefits discussed, surgical consent, monitors and equipment checked, pre-op evaluation, timeout performed and anesthesia consent Position: Trendelenburg Lidocaine 1% used for infiltration and patient sedated Hand hygiene performed , maximum sterile barriers used  and Seldinger technique used Catheter size: 8.5 Fr Central line was placed.Sheath introducer Procedure performed using ultrasound guided technique. Ultrasound Notes:anatomy identified, needle tip was noted to be adjacent to the nerve/plexus identified, no ultrasound evidence of intravascular and/or intraneural injection and image(s) printed for medical record Attempts: 1 Following insertion, line sutured, dressing applied and Biopatch. Post procedure assessment: blood return through all ports, free fluid flow and no air  Patient tolerated the procedure well with no immediate complications.

## 2018-07-18 NOTE — Transfer of Care (Signed)
Immediate Anesthesia Transfer of Care Note  Patient: De Kopka  Procedure(s) Performed: CORONARY ARTERY BYPASS GRAFTING (CABG) X 5 USING LEFT INTERNAL MAMMARY ARTERY AND RIGHT SAPHENOUS VEIN HARVESTED ENDOSCOPICALLY (N/A Chest) TRANSESOPHAGEAL ECHOCARDIOGRAM (TEE) (N/A )  Patient Location: PACU  Anesthesia Type:General  Level of Consciousness: sedated, unresponsive and Patient remains intubated per anesthesia plan  Airway & Oxygen Therapy: Patient remains intubated per anesthesia plan and Patient placed on Ventilator (see vital sign flow sheet for setting)  Post-op Assessment: Report given to RN and Post -op Vital signs reviewed and stable  Post vital signs: Reviewed and stable  Last Vitals:  Vitals Value Taken Time  BP 113/85 07/18/2018  1:45 PM  Temp 36.2 C 07/18/2018  1:48 PM  Pulse    Resp 12 07/18/2018  1:48 PM  SpO2    Vitals shown include unvalidated device data.  Last Pain:  Vitals:   07/18/18 0312  TempSrc: Oral  PainSc: 0-No pain         Complications: No apparent anesthesia complications

## 2018-07-18 NOTE — Procedures (Signed)
Extubation Procedure Note  Patient Details:   Name: Nicholas Mills DOB: Mar 03, 1950 MRN: [id number]   Airway Documentation:    Vent end date: 07/18/18 Vent end time: 1758   Evaluation  O2 sats: stable throughout Complications: No apparent complications Patient did tolerate procedure well. Bilateral Breath Sounds: Clear, Diminished   Yes   Patient Extubated to 4L per SICU Protocol, NIF-25, VC-2.5  Hassan Buckler 07/18/2018, 6:06 PM

## 2018-07-18 NOTE — Op Note (Signed)
CARDIOVASCULAR SURGERY OPERATIVE NOTE  07/18/2018  Surgeon:  Alleen Borne, MD  First Assistant: Lowella Dandy,  PA-C   Preoperative Diagnosis:  Severe multi-vessel coronary artery disease   Postoperative Diagnosis:  Same   Procedure:  1. Median Sternotomy 2. Extracorporeal circulation 3.   Coronary artery bypass grafting x 5   Left internal mammary graft to the LAD  SVG to diagonal 2  Sequential SVG to Ramus and OM  SVG to PDA 4.   Endoscopic vein harvest from the right leg   Anesthesia:  General Endotracheal   Clinical History/Surgical Indication:  The patient is a 68 year old gentleman with recent onset of fatigue and exertional shortness of breath and has had episodes of "heart burn" type pain that he thought was due to his hiatal hernia. 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 He presented to [facility] and had a Lexiscan Cardiolite studydone on 07/10/2018 at [facility]. Results of the the perfusion study showed an ejection fraction of 28% with global hypokinesia there is a small fixed defect noted in the anterior septal wall and there is ischemia noted in the anterior septal region and additional ischemia noted in inferior lateral wall mid to base. The patient was then seen by Dr. Dulce Sellar in his office on 05/22, had a Troponin of 6.8 and was sent to [facility] ER for STEMI. Cath shows 80% ostial LM, 80% proximal LCX and 100% proximal RCA, 90% diagonal. Echo shows EF 40-45% with moderate MR. I agree with need for CABG. MR will need to be evaluated by TEE in the OR. I don't hear a murmur so it may be transient. I discussed the operative procedure with the patient including alternatives, benefits and risks; including but not limited to bleeding, blood  transfusion, infection, stroke, myocardial infarction, graft failure, heart block requiring a permanent pacemaker, organ dysfunction, and death.  Nicholas Mills understands and agrees to proceed.  Preparation:  The patient was seen in the preoperative holding area and the correct patient, correct operation were confirmed with the patient after reviewing the medical record and catheterization. The consent was signed by me. Preoperative antibiotics were given. A pulmonary arterial line and radial arterial line were placed by the anesthesia team. The patient was taken back to the operating room and positioned supine on the operating room table. After being placed under general endotracheal anesthesia by the anesthesia team a foley catheter was placed. The neck, chest, abdomen, and both legs were prepped with betadine soap and solution and draped in the usual sterile manner. A surgical time-out was taken and the correct patient and operative procedure were confirmed with the nursing and anesthesia staff.   Cardiopulmonary Bypass:  A median sternotomy was performed. The pericardium was opened in the midline. Right ventricular function appeared normal. The ascending aorta was of normal size and had no palpable plaque. There were no contraindications to aortic cannulation or cross-clamping. The patient was fully systemically heparinized and the ACT was maintained > 400 sec. The proximal aortic arch was cannulated with a 20 F aortic cannula for arterial inflow. Venous cannulation was performed via the right atrial appendage using a two-staged venous cannula. An antegrade cardioplegia/vent cannula was inserted into the mid-ascending aorta. Aortic occlusion was performed with a single cross-clamp. Systemic cooling to 32 degrees Centigrade and topical cooling of the heart with iced saline were used.  Hyperkalemic antegrade cold blood cardioplegia was used to induce diastolic arrest and was then given at about 20 minute  intervals throughout the period of arrest to maintain myocardial temperature at or below 10 degrees centigrade. A temperature probe was inserted into the interventricular septum and an insulating pad was placed in the pericardium.   Left internal mammary harvest:  The left side of the sternum was retracted using the Rultract retractor. The left internal mammary artery was harvested as a pedicle graft. All side branches were clipped. It was a medium-sized vessel of good quality with excellent blood flow. It was ligated distally and divided. It was sprayed with topical papaverine solution to prevent vasospasm.   Endoscopic vein harvest:  The right greater saphenous vein was harvested endoscopically through a 2 cm incision medial to the right knee. It was harvested from the upper thigh to below the knee. It was a medium-sized vein of good quality but had thickened wall throughout. The side branches were all ligated with 4-0 silk ties.    Coronary arteries:  The coronary arteries were examined.   LAD:  Intramyocardial in the proximal half but distally on the surface and had no distal disease. Diagonal 2 is a medium caliber vessel with no distal disease.  LCX:  Large Ramus that was intramyocardial. It was located proximally and had mild disease there. The OM was a large vessel with no distal disease.  RCA:  Diffusely diseased. The PDA was a medium caliber vessel with no distal disease.   Grafts:  1. LIMA to the LAD: 2.0 mm. It was sewn end to side using 8-0 prolene continuous suture. 2. SVG to diagonal 2:  1.6 mm. It was sewn end to side using 7-0 prolene continuous suture. 3. Sequential SVG to Ramus:  1.75 mm. It was sewn sequential side to side using 7-0 prolene continuous suture. 4. Sequential SVG to OM:  1.75 mm. It was sewn sequential end to side using 7-0 prolene continuous suture. 5.   SVG to PDA:  1.6 mm. It was sewn end to side using 7-0 prolene continuous suture.  The proximal  vein graft anastomoses were performed to the mid-ascending aorta using continuous 6-0 prolene suture. Graft markers were placed around the proximal anastomoses.   Completion: 106 The patient was rewarmed to 37 degrees Centigrade. The clamp was removed from the LIMA pedicle and there was rapid warming of the septum and return of ventricular fibrillation. The crossclamp was removed with a time of 106 minutes. There was spontaneous return of sinus rhythm. The distal and proximal anastomoses were checked for hemostasis. The position of the grafts was satisfactory. Two temporary epicardial pacing wires were placed on the right atrium and two on the right ventricle. The patient was weaned from CPB without difficulty on no inotropes. CPB time was 127 minutes. Cardiac output was 4 LPM. TEE showed unchanged mild LV systolic dysfunction with an LVEF estimated at 40-45%. There was unchanged mild to moderate central MR.  Heparin was fully reversed with protamine and the aortic and venous cannulas removed. Hemostasis was achieved. Mediastinal and left pleural drainage tubes were placed. The sternum was closed with  #6 stainless steel wires. The fascia was closed with continuous # 1 vicryl suture. The subcutaneous tissue was closed with 2-0 vicryl continuous suture. The skin was closed with 3-0 vicryl subcuticular suture. All sponge, needle, and instrument counts were reported correct at the end of the case. Dry sterile dressings were placed over the incisions and around  the chest tubes which were connected to pleurevac suction. The patient was then transported to the surgical intensive care unit in  stable condition.

## 2018-07-18 NOTE — Anesthesia Procedure Notes (Signed)
Arterial Line Insertion Start/End5/29/2020 6:40 AM, 07/18/2018 6:45 AM Performed by: Jeani Hawking, CRNA, CRNA  Patient location: OOR procedure area. Preanesthetic checklist: patient identified, IV checked, site marked, risks and benefits discussed, surgical consent, monitors and equipment checked, pre-op evaluation, timeout performed and anesthesia consent Lidocaine 1% used for infiltration and patient sedated Left, radial was placed Catheter size: 20 G Maximum sterile barriers used   Attempts: 1 Procedure performed without using ultrasound guided technique. Following insertion, dressing applied and Biopatch. Post procedure assessment: normal  Patient tolerated the procedure well with no immediate complications.

## 2018-07-18 NOTE — OR Nursing (Signed)
First call SICU 1158.

## 2018-07-18 NOTE — Anesthesia Procedure Notes (Signed)
Central Venous Catheter Insertion Performed by: Beryle Lathe, MD, anesthesiologist Start/End5/29/2020 7:12 AM, 07/18/2018 7:15 AM Patient location: Pre-op. Preanesthetic checklist: patient identified, IV checked, risks and benefits discussed, surgical consent, monitors and equipment checked, pre-op evaluation, timeout performed and anesthesia consent Position: Trendelenburg Hand hygiene performed  and maximum sterile barriers used  Total catheter length 10. PA cath was placed.Swan type:thermodilution PA Cath depth:48 Procedure performed without using ultrasound guided technique. Attempts: 1 Patient tolerated the procedure well with no immediate complications.

## 2018-07-18 NOTE — Brief Op Note (Signed)
07/11/2018 - 07/18/2018  9:34 AM  PATIENT:  Nicholas Mills  68 y.o. male  PRE-OPERATIVE DIAGNOSIS:  CAD  POST-OPERATIVE DIAGNOSIS:  CAD  PROCEDURE:  Procedure(s):  CORONARY ARTERY BYPASS GRAFTING x 5 -LIMA to LAD -SVG to DIAGONAL -SVG to OM and RAMUS INTERMEDIATE -SVG to PDA  ENDOSCOPIC HARVEST GREATER SAPHENOUS VEIN -Right Leg  TRANSESOPHAGEAL ECHOCARDIOGRAM (TEE) (N/A)  SURGEON:  Surgeon(s) and Role:    * Bartle, Payton Doughty, MD - Primary  PHYSICIAN ASSISTANT: Jamilynn Whitacre PA-C  ANESTHESIA:   general  EBL:  200 ml  BLOOD ADMINISTERED:none  DRAINS: Left Pleural Chest Tubes, Mediastinal Chest Tubes   LOCAL MEDICATIONS USED:  MARCAINE     SPECIMEN:  No Specimen  DISPOSITION OF SPECIMEN:  N/A  COUNTS:  YES  TOURNIQUET:  * No tourniquets in log *  DICTATION: .Dragon Dictation  PLAN OF CARE: Admit to inpatient   PATIENT DISPOSITION:  ICU - intubated and hemodynamically stable.   Delay start of Pharmacological VTE agent (>24hrs) due to surgical blood loss or risk of bleeding: yes

## 2018-07-19 ENCOUNTER — Inpatient Hospital Stay (HOSPITAL_COMMUNITY): Payer: Medicare Other

## 2018-07-19 LAB — GLUCOSE, CAPILLARY
Glucose-Capillary: 102 mg/dL — ABNORMAL HIGH (ref 70–99)
Glucose-Capillary: 103 mg/dL — ABNORMAL HIGH (ref 70–99)
Glucose-Capillary: 106 mg/dL — ABNORMAL HIGH (ref 70–99)
Glucose-Capillary: 108 mg/dL — ABNORMAL HIGH (ref 70–99)
Glucose-Capillary: 115 mg/dL — ABNORMAL HIGH (ref 70–99)
Glucose-Capillary: 117 mg/dL — ABNORMAL HIGH (ref 70–99)
Glucose-Capillary: 118 mg/dL — ABNORMAL HIGH (ref 70–99)
Glucose-Capillary: 122 mg/dL — ABNORMAL HIGH (ref 70–99)
Glucose-Capillary: 131 mg/dL — ABNORMAL HIGH (ref 70–99)
Glucose-Capillary: 134 mg/dL — ABNORMAL HIGH (ref 70–99)
Glucose-Capillary: 137 mg/dL — ABNORMAL HIGH (ref 70–99)
Glucose-Capillary: 140 mg/dL — ABNORMAL HIGH (ref 70–99)
Glucose-Capillary: 144 mg/dL — ABNORMAL HIGH (ref 70–99)
Glucose-Capillary: 147 mg/dL — ABNORMAL HIGH (ref 70–99)
Glucose-Capillary: 151 mg/dL — ABNORMAL HIGH (ref 70–99)
Glucose-Capillary: 153 mg/dL — ABNORMAL HIGH (ref 70–99)
Glucose-Capillary: 167 mg/dL — ABNORMAL HIGH (ref 70–99)
Glucose-Capillary: 171 mg/dL — ABNORMAL HIGH (ref 70–99)
Glucose-Capillary: 238 mg/dL — ABNORMAL HIGH (ref 70–99)
Glucose-Capillary: 91 mg/dL (ref 70–99)
Glucose-Capillary: 92 mg/dL (ref 70–99)
Glucose-Capillary: 93 mg/dL (ref 70–99)
Glucose-Capillary: 97 mg/dL (ref 70–99)

## 2018-07-19 LAB — BASIC METABOLIC PANEL
Anion gap: 9 (ref 5–15)
Anion gap: 11 (ref 5–15)
BUN: 13 mg/dL (ref 8–23)
BUN: 16 mg/dL (ref 8–23)
CO2: 21 mmol/L — ABNORMAL LOW (ref 22–32)
CO2: 20 mmol/L — ABNORMAL LOW (ref 22–32)
Calcium: 8.2 mg/dL — ABNORMAL LOW (ref 8.9–10.3)
Calcium: 8.4 mg/dL — ABNORMAL LOW (ref 8.9–10.3)
Chloride: 111 mmol/L (ref 98–111)
Chloride: 110 mmol/L (ref 98–111)
Creatinine, Ser: 1.03 mg/dL (ref 0.61–1.24)
Creatinine, Ser: 1.26 mg/dL — ABNORMAL HIGH (ref 0.61–1.24)
GFR calc Af Amer: >60 mL/min (ref >60)
GFR calc Af Amer: >60 mL/min (ref >60)
GFR calc non Af Amer: >60 mL/min (ref >60)
GFR calc non Af Amer: 58 mL/min — ABNORMAL LOW (ref >60)
Glucose, Bld: 107 mg/dL — ABNORMAL HIGH (ref 70–99)
Glucose, Bld: 176 mg/dL — ABNORMAL HIGH (ref 70–99)
Potassium: 4.1 mmol/L (ref 3.5–5.1)
Potassium: 4.2 mmol/L (ref 3.5–5.1)
Sodium: 141 mmol/L (ref 135–145)
Sodium: 141 mmol/L (ref 135–145)

## 2018-07-19 LAB — CBC
HCT: 33.2 % — ABNORMAL LOW (ref 39.0–52.0)
HCT: 35.9 % — ABNORMAL LOW (ref 39.0–52.0)
Hemoglobin: 11.3 g/dL — ABNORMAL LOW (ref 13.0–17.0)
Hemoglobin: 12 g/dL — ABNORMAL LOW (ref 13.0–17.0)
MCH: 32.8 pg (ref 26.0–34.0)
MCH: 32.7 pg (ref 26.0–34.0)
MCHC: 34 g/dL (ref 30.0–36.0)
MCHC: 33.4 g/dL (ref 30.0–36.0)
MCV: 96.5 fL (ref 80.0–100.0)
MCV: 97.8 fL (ref 80.0–100.0)
Platelets: 177 10*3/uL (ref 150–400)
Platelets: 188 10*3/uL (ref 150–400)
RBC: 3.44 MIL/uL — ABNORMAL LOW (ref 4.22–5.81)
RBC: 3.67 MIL/uL — ABNORMAL LOW (ref 4.22–5.81)
RDW: 12 % (ref 11.5–15.5)
RDW: 12.1 % (ref 11.5–15.5)
WBC: 11.8 10*3/uL — ABNORMAL HIGH (ref 4.0–10.5)
WBC: 12.8 10*3/uL — ABNORMAL HIGH (ref 4.0–10.5)
nRBC: 0 % (ref 0.0–0.2)
nRBC: 0 % (ref 0.0–0.2)

## 2018-07-19 LAB — MAGNESIUM
Magnesium: 2.3 mg/dL (ref 1.7–2.4)
Magnesium: 2 mg/dL (ref 1.7–2.4)

## 2018-07-19 MED ORDER — CEFAZOLIN SODIUM-DEXTROSE 1-4 GM/50ML-% IV SOLN: 1.0000 g | Freq: Three times a day (TID) | INTRAVENOUS | Status: DC

## 2018-07-19 MED ORDER — CHLORHEXIDINE GLUCONATE CLOTH 2 % EX PADS: 6.0000 | MEDICATED_PAD | Freq: Every day | CUTANEOUS | Status: DC

## 2018-07-19 MED ORDER — INSULIN DETEMIR 100 UNIT/ML ~~LOC~~ SOLN
10.0000 [IU] | Freq: Every day | SUBCUTANEOUS | Status: DC
  Administered 2018-07-20 – 2018-07-21 (×2): 10 [IU] via SUBCUTANEOUS
  Filled 2018-07-19 (×3): qty 0.1

## 2018-07-19 MED ORDER — TRAMADOL HCL 50 MG PO TABS
50.0000 mg | ORAL_TABLET | ORAL | Status: DC | PRN
  Administered 2018-07-19: 50 mg via ORAL
  Filled 2018-07-19: qty 1

## 2018-07-19 MED ORDER — SODIUM CHLORIDE 0.9% FLUSH
10.0000 mL | Freq: Two times a day (BID) | INTRAVENOUS | Status: DC
  Administered 2018-07-20: 09:00:00 10 mL

## 2018-07-19 MED ORDER — ENOXAPARIN SODIUM 40 MG/0.4ML ~~LOC~~ SOLN
40.0000 mg | Freq: Every day | SUBCUTANEOUS | Status: DC
  Administered 2018-07-19 – 2018-07-22 (×4): 40 mg via SUBCUTANEOUS
  Filled 2018-07-19 (×4): qty 0.4

## 2018-07-19 MED ORDER — INSULIN ASPART 100 UNIT/ML ~~LOC~~ SOLN
0.0000 [IU] | SUBCUTANEOUS | Status: DC
  Administered 2018-07-19: 8 [IU] via SUBCUTANEOUS
  Administered 2018-07-19: 2 [IU] via SUBCUTANEOUS
  Administered 2018-07-19: 4 [IU] via SUBCUTANEOUS
  Administered 2018-07-20: 2 [IU] via SUBCUTANEOUS

## 2018-07-19 MED ORDER — ORAL CARE MOUTH RINSE
15.0000 mL | Freq: Two times a day (BID) | OROMUCOSAL | Status: DC
  Administered 2018-07-19 – 2018-07-22 (×6): 15 mL via OROMUCOSAL

## 2018-07-19 MED ORDER — SODIUM CHLORIDE 0.9 % IV SOLN
INTRAVENOUS | Status: DC | PRN
  Administered 2018-07-19: 250 mL via INTRAVENOUS

## 2018-07-19 MED ORDER — SODIUM CHLORIDE 0.9% FLUSH: 10.0000 mL | INTRAVENOUS | Status: DC | PRN

## 2018-07-19 MED ORDER — INSULIN DETEMIR 100 UNIT/ML ~~LOC~~ SOLN
15.0000 [IU] | Freq: Once | SUBCUTANEOUS | Status: AC
  Administered 2018-07-19: 15 [IU] via SUBCUTANEOUS
  Filled 2018-07-19: qty 0.15

## 2018-07-19 MED ORDER — FUROSEMIDE 10 MG/ML IJ SOLN
40.0000 mg | Freq: Once | INTRAMUSCULAR | Status: AC
  Administered 2018-07-19: 40 mg via INTRAVENOUS
  Filled 2018-07-19: qty 4

## 2018-07-19 NOTE — Progress Notes (Signed)
1 Day Post-Op Procedure(s) (LRB): CORONARY ARTERY BYPASS GRAFTING (CABG) X 5 USING LEFT INTERNAL MAMMARY ARTERY AND RIGHT SAPHENOUS VEIN HARVESTED ENDOSCOPICALLY (N/A) TRANSESOPHAGEAL ECHOCARDIOGRAM (TEE) (N/A) Subjective: Incisional soreness  Objective: Vital signs in last 24 hours: Temp:  [97 F (36.1 C)-101.1 F (38.4 C)] 99.1 F (37.3 C) (05/30 0800) Pulse Rate:  [78-90] 80 (05/30 0800) Cardiac Rhythm: Normal sinus rhythm (05/30 0800) Resp:  [12-28] 14 (05/30 0800) BP: (85-121)/(65-91) 99/65 (05/30 0800) SpO2:  [90 %-100 %] 97 % (05/30 0800) Arterial Line BP: (70-149)/(38-81) 130/56 (05/30 0800) FiO2 (%):  [40 %-50 %] 40 % (05/29 1705) Weight:  [76.7 kg] 76.7 kg (05/30 0430)  Hemodynamic parameters for last 24 hours: PAP: (22-46)/(1-21) 30/14 CO:  [2.4 L/min-4.6 L/min] 4.6 L/min CI:  [1.3 L/min/m2-2.7 L/min/m2] 2.4 L/min/m2  Intake/Output from previous day: 05/29 0701 - 05/30 0700 In: 3702 [I.V.:2382.1; Blood:315; IV Piggyback:1005] Out: 5200 [Urine:4830; Chest Tube:370] Intake/Output this shift: Total I/O In: 138.2 [I.V.:38.2; IV Piggyback:100] Out: 150 [Urine:150]  General appearance: alert and cooperative Neurologic: intact Heart: regular rate and rhythm, S1, S2 normal, no murmur, click, rub or gallop Lungs: clear to auscultation bilaterally Extremities: edema minimal Wound: dressings dry  Lab Results: Recent Labs    07/18/18 1845  07/18/18 1853 07/19/18 0418  WBC 12.9*  --   --  11.8*  HGB 12.0*   < > 11.9* 11.3*  HCT 34.3*   < > 35.0* 33.2*  PLT 188  --   --  177   < > = values in this interval not displayed.   BMET:  Recent Labs    07/18/18 0246  07/18/18 1849 07/18/18 1853 07/19/18 0418  NA 137   < > 141 141 141  K 3.9   < > 4.6 4.6 4.1  CL 106  --  110  --  111  CO2 21*  --   --   --  21*  GLUCOSE 148*   < > 179*  --  107*  BUN 18  --  15  --  13  CREATININE 1.08   < > 0.90  --  1.03  CALCIUM 9.5  --   --   --  8.2*   < > = values in  this interval not displayed.    PT/INR:  Recent Labs    07/18/18 1302  LABPROT 15.5*  INR 1.2   ABG    Component Value Date/Time   PHART 7.364 07/18/2018 1853   HCO3 19.3 (L) 07/18/2018 1853   TCO2 20 (L) 07/18/2018 1853   ACIDBASEDEF 5.0 (H) 07/18/2018 1853   O2SAT 94.0 07/18/2018 1853   CBG (last 3)  Recent Labs    07/19/18 0508 07/19/18 0622 07/19/18 0734  GLUCAP 103* 117* 118*   CXR: ok  ECG: sinus, RBBB (old) Assessment/Plan: S/P Procedure(s) (LRB): CORONARY ARTERY BYPASS GRAFTING (CABG) X 5 USING LEFT INTERNAL MAMMARY ARTERY AND RIGHT SAPHENOUS VEIN HARVESTED ENDOSCOPICALLY (N/A) TRANSESOPHAGEAL ECHOCARDIOGRAM (TEE) (N/A)  POD 1 Hemodynamically stable in sinus rhythm. Continue Lopressor.  Mild volume excess: diurese  DM: poorly controlled preop with Hgb A1c of 8.1. Will give him Levemir and change to SSI. He was only on Metformin preop and will probably need additional medication, dietary modification.  DC chest tubes, swan, arterial line.  He injured left foot when he fell and has some cellulitis over the great MTP joint, minimally tender. Says he was walking on it ok. Will get xray of foot to look for fracture. Continue periop antibiotics  and observe. May need to continue antibiotic if still red tomorrow.  Mobilize, IS.   LOS: 8 days    Nicholas Mills 07/19/2018

## 2018-07-19 NOTE — Anesthesia Postprocedure Evaluation (Signed)
Anesthesia Post Note  Patient: Nicholas Mills  Procedure(s) Performed: CORONARY ARTERY BYPASS GRAFTING (CABG) X 5 USING LEFT INTERNAL MAMMARY ARTERY AND RIGHT SAPHENOUS VEIN HARVESTED ENDOSCOPICALLY (N/A Chest) TRANSESOPHAGEAL ECHOCARDIOGRAM (TEE) (N/A )     Patient location during evaluation: ICU Anesthesia Type: General Level of consciousness: awake and alert Pain management: pain level controlled Vital Signs Assessment: post-procedure vital signs reviewed and stable Respiratory status: spontaneous breathing, nonlabored ventilation, respiratory function stable and patient connected to nasal cannula oxygen Cardiovascular status: blood pressure returned to baseline and stable Postop Assessment: no apparent nausea or vomiting Anesthetic complications: no Comments: Extubated POD#0, doing well.    Last Vitals:  Vitals:   07/19/18 1200 07/19/18 1300  BP: 128/83 120/80  Pulse: 77 75  Resp: 17 16  Temp: 37.3 C   SpO2: 93% 95%    Last Pain:  Vitals:   07/19/18 1200  TempSrc: Core  PainSc: 4                  Beryle Lathe

## 2018-07-19 NOTE — Progress Notes (Signed)
Patient ID: Nicholas Mills, male   DOB: 02-04-1951, 68 y.o.   MRN: [id number] [facility] Evening Rounds:  Hemodynamically stable in sinus rhythm.  Diuresing well  CBC    Component Value Date/Time   WBC 12.8 (H) 07/19/2018 1617   RBC 3.67 (L) 07/19/2018 1617   HGB 12.0 (L) 07/19/2018 1617   HGB 13.9 07/11/2018 1421   HCT 35.9 (L) 07/19/2018 1617   HCT 39.9 07/11/2018 1421   PLT 188 07/19/2018 1617   PLT 198 07/11/2018 1421   MCV 97.8 07/19/2018 1617   MCV 100 (H) 07/11/2018 1421   MCH 32.7 07/19/2018 1617   MCHC 33.4 07/19/2018 1617   RDW 12.1 07/19/2018 1617   RDW 12.2 07/11/2018 1421   BMET pending this pm.

## 2018-07-20 ENCOUNTER — Inpatient Hospital Stay (HOSPITAL_COMMUNITY): Payer: Medicare Other

## 2018-07-20 DIAGNOSIS — Z951 Presence of aortocoronary bypass graft: Secondary | ICD-10-CM

## 2018-07-20 HISTORY — DX: Presence of aortocoronary bypass graft: Z95.1

## 2018-07-20 LAB — CBC
HCT: 32 % — ABNORMAL LOW (ref 39.0–52.0)
Hemoglobin: 10.7 g/dL — ABNORMAL LOW (ref 13.0–17.0)
MCH: 32.7 pg (ref 26.0–34.0)
MCHC: 33.4 g/dL (ref 30.0–36.0)
MCV: 97.9 fL (ref 80.0–100.0)
Platelets: 168 10*3/uL (ref 150–400)
RBC: 3.27 MIL/uL — ABNORMAL LOW (ref 4.22–5.81)
RDW: 12.1 % (ref 11.5–15.5)
WBC: 10.5 10*3/uL (ref 4.0–10.5)
nRBC: 0 % (ref 0.0–0.2)

## 2018-07-20 LAB — GLUCOSE, CAPILLARY
Glucose-Capillary: 117 mg/dL — ABNORMAL HIGH (ref 70–99)
Glucose-Capillary: 140 mg/dL — ABNORMAL HIGH (ref 70–99)
Glucose-Capillary: 240 mg/dL — ABNORMAL HIGH (ref 70–99)
Glucose-Capillary: 319 mg/dL — ABNORMAL HIGH (ref 70–99)
Glucose-Capillary: 148 mg/dL — ABNORMAL HIGH (ref 70–99)

## 2018-07-20 LAB — BASIC METABOLIC PANEL
Anion gap: 8 (ref 5–15)
BUN: 14 mg/dL (ref 8–23)
CO2: 24 mmol/L (ref 22–32)
Calcium: 8.3 mg/dL — ABNORMAL LOW (ref 8.9–10.3)
Chloride: 108 mmol/L (ref 98–111)
Creatinine, Ser: 0.99 mg/dL (ref 0.61–1.24)
GFR calc Af Amer: >60 mL/min (ref >60)
GFR calc non Af Amer: >60 mL/min (ref >60)
Glucose, Bld: 114 mg/dL — ABNORMAL HIGH (ref 70–99)
Potassium: 3.8 mmol/L (ref 3.5–5.1)
Sodium: 140 mmol/L (ref 135–145)

## 2018-07-20 MED ORDER — ASPIRIN EC 325 MG PO TBEC
325.0000 mg | DELAYED_RELEASE_TABLET | Freq: Every day | ORAL | Status: DC
  Administered 2018-07-21 – 2018-07-23 (×3): 325 mg via ORAL
  Filled 2018-07-20 (×3): qty 1

## 2018-07-20 MED ORDER — CHLORHEXIDINE GLUCONATE CLOTH 2 % EX PADS: 6.0000 | MEDICATED_PAD | Freq: Every day | CUTANEOUS | Status: DC

## 2018-07-20 MED ORDER — METOPROLOL TARTRATE 12.5 MG HALF TABLET
12.5000 mg | ORAL_TABLET | Freq: Two times a day (BID) | ORAL | Status: DC
  Administered 2018-07-20: 12.5 mg via ORAL
  Filled 2018-07-20: qty 1

## 2018-07-20 MED ORDER — POTASSIUM CHLORIDE CRYS ER 20 MEQ PO TBCR
40.0000 meq | EXTENDED_RELEASE_TABLET | Freq: Once | ORAL | Status: AC
  Administered 2018-07-20: 40 meq via ORAL
  Filled 2018-07-20: qty 2

## 2018-07-20 MED ORDER — ONDANSETRON HCL 4 MG/2ML IJ SOLN: 4.0000 mg | Freq: Four times a day (QID) | INTRAMUSCULAR | Status: DC | PRN

## 2018-07-20 MED ORDER — OXYCODONE HCL 5 MG PO TABS: 5.0000 mg | ORAL_TABLET | ORAL | Status: DC | PRN

## 2018-07-20 MED ORDER — FUROSEMIDE 10 MG/ML IJ SOLN
40.0000 mg | Freq: Once | INTRAMUSCULAR | Status: AC
  Administered 2018-07-20: 40 mg via INTRAVENOUS
  Filled 2018-07-20: qty 4

## 2018-07-20 MED ORDER — SODIUM CHLORIDE 0.9% FLUSH: 3.0000 mL | INTRAVENOUS | Status: DC | PRN

## 2018-07-20 MED ORDER — TRAMADOL HCL 50 MG PO TABS: 50.0000 mg | ORAL_TABLET | Freq: Four times a day (QID) | ORAL | Status: DC | PRN

## 2018-07-20 MED ORDER — MOVING RIGHT ALONG BOOK
Freq: Once | Status: AC
  Administered 2018-07-20: 1
  Filled 2018-07-20: qty 1

## 2018-07-20 MED ORDER — ONDANSETRON HCL 4 MG PO TABS: 4.0000 mg | ORAL_TABLET | Freq: Four times a day (QID) | ORAL | Status: DC | PRN

## 2018-07-20 MED ORDER — SODIUM CHLORIDE 0.9 % IV SOLN: 250.0000 mL | INTRAVENOUS | Status: DC | PRN

## 2018-07-20 MED ORDER — INSULIN ASPART 100 UNIT/ML ~~LOC~~ SOLN
0.0000 [IU] | Freq: Three times a day (TID) | SUBCUTANEOUS | Status: DC
  Administered 2018-07-20: 8 [IU] via SUBCUTANEOUS
  Administered 2018-07-20: 16 [IU] via SUBCUTANEOUS
  Administered 2018-07-20: 22:00:00 2 [IU] via SUBCUTANEOUS
  Administered 2018-07-21: 12 [IU] via SUBCUTANEOUS
  Administered 2018-07-21: 16 [IU] via SUBCUTANEOUS
  Administered 2018-07-22: 2 [IU] via SUBCUTANEOUS
  Administered 2018-07-22: 8 [IU] via SUBCUTANEOUS
  Administered 2018-07-22: 12 [IU] via SUBCUTANEOUS

## 2018-07-20 MED ORDER — POTASSIUM CHLORIDE CRYS ER 20 MEQ PO TBCR
20.0000 meq | EXTENDED_RELEASE_TABLET | Freq: Two times a day (BID) | ORAL | Status: DC
  Administered 2018-07-21 – 2018-07-22 (×4): 20 meq via ORAL
  Filled 2018-07-20 (×4): qty 1

## 2018-07-20 MED ORDER — SENNOSIDES-DOCUSATE SODIUM 8.6-50 MG PO TABS: 1.0000 | ORAL_TABLET | Freq: Two times a day (BID) | ORAL | Status: DC | PRN

## 2018-07-20 MED ORDER — INSULIN ASPART 100 UNIT/ML ~~LOC~~ SOLN: 0.0000 [IU] | Freq: Three times a day (TID) | SUBCUTANEOUS | Status: DC

## 2018-07-20 MED ORDER — SODIUM CHLORIDE 0.9% FLUSH
3.0000 mL | Freq: Two times a day (BID) | INTRAVENOUS | Status: DC
  Administered 2018-07-20 – 2018-07-22 (×6): 3 mL via INTRAVENOUS

## 2018-07-20 MED ORDER — PANTOPRAZOLE SODIUM 40 MG PO TBEC
40.0000 mg | DELAYED_RELEASE_TABLET | Freq: Every day | ORAL | Status: DC
  Administered 2018-07-21 – 2018-07-23 (×3): 40 mg via ORAL
  Filled 2018-07-20 (×3): qty 1

## 2018-07-20 MED ORDER — FUROSEMIDE 40 MG PO TABS
40.0000 mg | ORAL_TABLET | Freq: Every day | ORAL | Status: AC
  Administered 2018-07-21 – 2018-07-23 (×3): 40 mg via ORAL
  Filled 2018-07-20 (×3): qty 1

## 2018-07-20 NOTE — Progress Notes (Signed)
Discontinued right introducer vaseline gauze, gauze. Held pressure x5 minutes hemostasis achieved. Instructed patient to lie flat for 1/2 hour  And to call nurse if noticed any wetness on neck.

## 2018-07-20 NOTE — Progress Notes (Signed)
2 Days Post-Op Procedure(s) (LRB): CORONARY ARTERY BYPASS GRAFTING (CABG) X 5 USING LEFT INTERNAL MAMMARY ARTERY AND RIGHT SAPHENOUS VEIN HARVESTED ENDOSCOPICALLY (N/A) TRANSESOPHAGEAL ECHOCARDIOGRAM (TEE) (N/A) Subjective: No complaints  Objective: Vital signs in last 24 hours: Temp:  [97.7 F (36.5 C)-99.1 F (37.3 C)] 98.1 F (36.7 C) (05/31 0746) Pulse Rate:  [69-99] 99 (05/31 0746) Cardiac Rhythm: Normal sinus rhythm (05/31 0325) Resp:  [12-27] 27 (05/31 0746) BP: (104-135)/(58-95) 127/77 (05/31 0746) SpO2:  [89 %-100 %] 91 % (05/31 0600) Arterial Line BP: (126-158)/(54-64) 158/62 (05/30 1530) Weight:  [76.9 kg] 76.9 kg (05/31 0300)  Hemodynamic parameters for last 24 hours: PAP: (30-38)/(14-16) 38/16  Intake/Output from previous day: 05/30 0701 - 05/31 0700 In: 1101.7 [P.O.:720; I.V.:281.7; IV Piggyback:100] Out: 2595 [Urine:2575; Chest Tube:20] Intake/Output this shift: Total I/O In: 240 [P.O.:240] Out: -   General appearance: alert and cooperative Neurologic: intact Heart: regular rate and rhythm, S1, S2 normal, no murmur Lungs: diminished breath sounds bibasilar Extremities: extremities normal, atraumatic, no cyanosis or edema Wound: incisions ok  Lab Results: Recent Labs    07/19/18 1617 07/20/18 0257  WBC 12.8* 10.5  HGB 12.0* 10.7*  HCT 35.9* 32.0*  PLT 188 168   BMET:  Recent Labs    07/19/18 1617 07/20/18 0257  NA 141 140  K 4.2 3.8  CL 110 108  CO2 20* 24  GLUCOSE 176* 114*  BUN 16 14  CREATININE 1.26* 0.99  CALCIUM 8.4* 8.3*    PT/INR:  Recent Labs    07/18/18 1302  LABPROT 15.5*  INR 1.2   ABG    Component Value Date/Time   PHART 7.364 07/18/2018 1853   HCO3 19.3 (L) 07/18/2018 1853   TCO2 20 (L) 07/18/2018 1853   ACIDBASEDEF 5.0 (H) 07/18/2018 1853   O2SAT 94.0 07/18/2018 1853   CBG (last 3)  Recent Labs    07/19/18 2355 07/20/18 0328 07/20/18 0744  GLUCAP 147* 117* 140*   CXR: left pleural effusion and left  basilar atelectasis  Assessment/Plan: S/P Procedure(s) (LRB): CORONARY ARTERY BYPASS GRAFTING (CABG) X 5 USING LEFT INTERNAL MAMMARY ARTERY AND RIGHT SAPHENOUS VEIN HARVESTED ENDOSCOPICALLY (N/A) TRANSESOPHAGEAL ECHOCARDIOGRAM (TEE) (N/A)  POD 2 Hemodynamically stable in sinus rhythm. Continue Lopressor  Wt is still a few lbs over preop and some left pleural effusion. Continue diuresis.  DM: glucose under control on Levemir and SSI. Resume Metformin when eating better and may need additional agent, dietary modification with preop Hgb A1c of 8.1.  X-ray of left foot shows no abnormality. Foot looks better and redness resolved.  Continue ambulation and IS.  Transfer to 4E.   LOS: 9 days    Alleen Borne 07/20/2018

## 2018-07-20 NOTE — Discharge Summary (Signed)
Physician Discharge Summary  Patient ID: Nicholas Mills MRN: [id number] DOB/AGE: 69-May-1952 69 y.o.  Admit date: 07/11/2018 Discharge date: 07/23/2018  Admission Diagnoses:  Patient Active Problem List   Diagnosis Date Noted  . Acute systolic CHF (congestive heart failure) ([place])   . Syncope and collapse 07/11/2018  . Dyslipidemia associated with type 2 diabetes mellitus ([place]) 07/11/2018  . RBBB (right bundle branch block) 07/11/2018  . NSTEMI (non-ST elevated myocardial infarction) ([place]) 07/11/2018   Discharge Diagnoses:   Patient Active Problem List   Diagnosis Date Noted  . S/P CABG x 5 Atrial fibrillation 07/20/2018  . Acute systolic CHF (congestive heart failure) ([place])   . Syncope and collapse 07/11/2018  . Dyslipidemia associated with type 2 diabetes mellitus ([place]) 07/11/2018  . RBBB (right bundle branch block) 07/11/2018  . NSTEMI (non-ST elevated myocardial infarction) ([place]) 07/11/2018   Discharged Condition: good  History of Present Illness:  [name] is a 68 yo white male with known history of diabetes mellitus, hyperlipidemia, hypertension, and remote tobacco abuse.  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  He states this occurred when he was walking to his barn, got dizzy and passed.  He said he came too about 1 hour later.  The patient admitted to some worsening shortness of breath and fatigue recently as well.  He went to his medical doctor who ordered a stress test which showed a reduced EF of 28% with global hypokinesia with a small fixed defect in the anterior septal wall with ischemia noted in the anterior septal region and additional ischemia in the inferior later wall mid to base.  He was evaluated by Dr. Loney Hering at which time EKG showed a RBBB it was felt he should undergo cardiac catheterization.  However during that visit a Troponin level was obtained and was positive.  The patient was contacted and instructed to report  to the ED.  He was ruled in for NSTEMI.  He was transferred to [facility] and underwent cardiac catheterization which showed multivessel CAD.  It was felt coronary bypass grafting would be indicated.   Hospital Course:   The patient remained chest pain free during hospitalization.  He was evaluated by Dr. Cyndia Bent who was in agreement the patient would benefit from coronary bypass grafting.  The risks and benefits of the procedure were explained to the patient and he was agreeable to proceed.  He was taken to the operating room on 07/18/2018.  He underwent CABG x 5 utilizing LIMA to LAD, SVG to PDA, SVG to Diagonal, and sequential SVG to Ramus Intermediate and OM.  He also underwent endoscopic harvest of greater saphenous vein from his right leg.  He tolerated the procedure without difficulty and was taken to the SICU in stable condition.  He was extubated the evening of surgery.  During his stay in the SICU his chest tubes, arterial lines, and swan ganz catheter were removed without difficulty.  His injured his left foot during a fall prior to admission.  He was treated for cellulitis over the great MTP joint.  He complains of pain and xray was obtained that didn't show evidence of a fracture.  He was treated with lasix for mild volume overload.  He was maintaining NSR and felt stable for transfer to the telemetry unit on 07/20/2018. He went into a fib with RVR early the morning of 06/01. He was given an Amiodarone bolus, IV Lopressor, and scheduled Lopressor was increased  to 25 mg bid. His potassium was 3.6 and supplemented accordingly. He converted to sinus rhythm and remained in sinus rhythm. Because his heart rate was in the 60's, Amiodarone was decreased to 200 mg bid. He has been tolerating a diet and has had a bowel movement. His sternal and lower leg wounds are clean and dry. He has been ambulating on room air. He continues to maintain sinus rhythm with HR in the 70's this am. He was instructed to  remove chest tube sutures on Wednesday 07/30/2018 (and given a suture removal kit). He is felt surgically stable for discharge today.  Significant Diagnostic Studies:  Show images for DG Chest 2 View  Study Result   CLINICAL DATA:  Prior heart surgery.  Chest pain.  EXAM: CHEST - 2 VIEW  COMPARISON:  07/20/2018.  07/11/2018.  FINDINGS: Prior CABG. Cardiomegaly with normal pulmonary vascularity. No evidence of pulmonary edema. Interstitial changes are stable and most likely chronic. Low lung volumes with mild bibasilar atelectasis. Small left pleural effusion. No pneumothorax. Sliding hiatal hernia.  IMPRESSION: 1. Prior CABG. Stable cardiomegaly. No pulmonary venous congestion. Stable interstitial prominence most likely chronic.  2. Low lung volumes with mild bibasilar atelectasis. Tiny left pleural effusion. No pneumothorax.  3.  Sliding hiatal hernia.   Electronically Signed   By: Marcello Moores  Register   On: 07/22/2018 08:12     Treatments:  1. Median Sternotomy 2. Extracorporeal circulation 3.   Coronary artery bypass grafting x 5   Left internal mammary graft to the LAD  SVG to diagonal 2  Sequential SVG to Ramus and OM  SVG to PDA 4.   Endoscopic vein harvest from the right leg by Dr. Cyndia Bent on 07/18/2018.   Discharge Exam: Blood pressure 125/84, pulse 69, temperature 98.3 F (36.8 C), temperature source Oral, resp. rate 18, height _0  (1.778 m), weight 75.1 kg, SpO2 97 %.   Cardiovascular: RRR Pulmonary: Clear to auscultation bilaterally Abdomen: Soft, non tender, bowel sounds present. Extremities: Trace  bilateral lower extremity edema. Wounds: Sternal dressing removed and wound is clean and dry.  No erythema or signs of infection.  Disposition: Discharge disposition: 01-Home or Self Care     Stable and discharged to home.   Allergies as of 07/23/2018   No Known Allergies     Medication List    STOP taking these medications   Aleve  220 MG tablet Generic drug:  naproxen sodium   lisinopril 20 MG tablet Commonly known as:  ZESTRIL     TAKE these medications   [place] 0.025 % ophthalmic solution Generic drug:  ketotifen Place 1 drop into both eyes 2 (two) times daily as needed (seasonal allergies).   amiodarone 200 MG tablet Commonly known as:  PACERONE Take 1 tablet (200 mg total) by mouth 2 (two) times daily. For 4 days then take Amiodarone 200 mg daily thereafter   aspirin EC 325 MG tablet Take 325 mg by mouth daily. What changed:  Another medication with the same name was removed. Continue taking this medication, and follow the directions you see here.   CoQ10 100 MG Caps Take 100 mg by mouth at bedtime.   furosemide 40 MG tablet Commonly known as:  LASIX Take 1 tablet (40 mg total) by mouth daily. For 4 days then stop.   metFORMIN 750 MG 24 hr tablet Commonly known as:  GLUCOPHAGE-XR Take 750 mg by mouth 2 (two) times daily.   metoprolol tartrate 25 MG tablet Commonly known as:  LOPRESSOR Take 1 tablet (25 mg total) by mouth 2 (two) times daily.   potassium chloride SA 20 MEQ tablet Commonly known as:  K-DUR Take 1 tablet (20 mEq total) by mouth 2 (two) times daily. For 4 days then stop.   PSYLLIUM PO Take 5 capsules by mouth 2 (two) times a day.   Red Yeast Rice 600 MG Caps Take 600 mg by mouth 2 (two) times a day.   rosuvastatin 10 MG tablet Commonly known as:  CRESTOR Take 1 tablet (10 mg total) by mouth daily.   traMADol 50 MG tablet Commonly known as:  ULTRAM Take 1 tablet (50 mg total) by mouth every 6 (six) hours as needed for moderate pain.     The patient has been discharged on:   1.Beta Blocker:  Yes [  x ]                              No   [   ]                              If No, reason:  2.Ace Inhibitor/ARB: Yes [   ]                                     No  [ x   ]                                     If No, reason: Labile BP  3.Statin:   Yes [  x ]                   No  [   ]                  If No, reason:  4.Ecasa:  Yes  [ x  ]                  No   [   ]                  If No, reason:  Follow-up Information    Richardo Priest, MD. Go on 08/05/2018.   Specialty:  Cardiology Why:  Appointment time is at 8:20  am and is a virtual appointment (do NOT come to office) Contact information: [address] [place] [address] [phone]        Gaye Pollack, MD. Go on 08/18/2018.   Specialty:  Cardiothoracic Surgery Why:  PA/LAT CXR to be taken (at [facility] which is in the same building as Dr. Vivi Martens office) on 06/29 at 1:00 pm;Appointment time is at 1:30 pm Contact information: [place] [address] [phone]        Mateo Flow, MD. Call.   Specialty:  Family Medicine Why:  for a follow up appointment regarding further diabetes management and surveillance of HGA1C 8.1 Contact information: [place] [address] [phone]        Richardo Priest, MD .   Specialty:  Cardiology Contact information: [address] [place] [address] [phone]        Patient Follow  up.   Why:  Given a suture removal kit and patient instructed how to remove the 3 silk chest tube sutures. Patient instructed to do so on Wednesday 07/30/2018.          Signed: Nani Skillern PA-C 07/23/2018, 7:37 AM

## 2018-07-20 NOTE — Progress Notes (Signed)
Patient arrived on 4E from 2H , assessment completed see flow sheet, placed on tele ccmd notified, patient oriented to room and staff, bed in lowest position, call bell within reach will continue to monitor.

## 2018-07-20 NOTE — Progress Notes (Signed)
Patient ambulated twice in the hall on this shift using a walker, ambulation well tolerated will continue to monitor.

## 2018-07-20 NOTE — Progress Notes (Signed)
Transferred patient to 4 E after report called with tele. Walking well, left foot ankle bruised from fall at home and tender, but no c/o any pain. Son called prior to transport, had to tell him friend passed away. Emotional support given, pt stated he expected this, and he is alright.  Receiving nurse made aware of call after transfer.

## 2018-07-21 ENCOUNTER — Encounter (HOSPITAL_COMMUNITY): Payer: Self-pay | Admitting: Surgery

## 2018-07-21 ENCOUNTER — Other Ambulatory Visit: Payer: Self-pay

## 2018-07-21 LAB — BASIC METABOLIC PANEL
Anion gap: 7 (ref 5–15)
BUN: 16 mg/dL (ref 8–23)
CO2: 24 mmol/L (ref 22–32)
Calcium: 8.9 mg/dL (ref 8.9–10.3)
Chloride: 107 mmol/L (ref 98–111)
Creatinine, Ser: 0.95 mg/dL (ref 0.61–1.24)
GFR calc Af Amer: >60 mL/min (ref >60)
GFR calc non Af Amer: >60 mL/min (ref >60)
Glucose, Bld: 116 mg/dL — ABNORMAL HIGH (ref 70–99)
Potassium: 3.6 mmol/L (ref 3.5–5.1)
Sodium: 138 mmol/L (ref 135–145)

## 2018-07-21 LAB — GLUCOSE, CAPILLARY
Glucose-Capillary: 92 mg/dL (ref 70–99)
Glucose-Capillary: 300 mg/dL — ABNORMAL HIGH (ref 70–99)
Glucose-Capillary: 285 mg/dL — ABNORMAL HIGH (ref 70–99)
Glucose-Capillary: 320 mg/dL — ABNORMAL HIGH (ref 70–99)
Glucose-Capillary: 107 mg/dL — ABNORMAL HIGH (ref 70–99)

## 2018-07-21 MED ORDER — INSULIN ASPART 100 UNIT/ML ~~LOC~~ SOLN: 3.0000 [IU] | Freq: Three times a day (TID) | SUBCUTANEOUS | Status: DC

## 2018-07-21 MED ORDER — POTASSIUM CHLORIDE CRYS ER 20 MEQ PO TBCR
40.0000 meq | EXTENDED_RELEASE_TABLET | Freq: Once | ORAL | Status: AC
  Administered 2018-07-21: 40 meq via ORAL
  Filled 2018-07-21: qty 2

## 2018-07-21 MED ORDER — AMIODARONE HCL 200 MG PO TABS
400.0000 mg | ORAL_TABLET | Freq: Two times a day (BID) | ORAL | Status: DC
  Administered 2018-07-21 (×2): 400 mg via ORAL
  Filled 2018-07-21 (×2): qty 2

## 2018-07-21 MED ORDER — AMIODARONE IV BOLUS ONLY 150 MG/100ML
150.0000 mg | Freq: Once | INTRAVENOUS | Status: AC
  Administered 2018-07-21: 150 mg via INTRAVENOUS
  Filled 2018-07-21: qty 100

## 2018-07-21 MED ORDER — METOPROLOL TARTRATE 5 MG/5ML IV SOLN
5.0000 mg | Freq: Once | INTRAVENOUS | Status: AC
  Administered 2018-07-21: 5 mg via INTRAVENOUS
  Filled 2018-07-21: qty 5

## 2018-07-21 MED ORDER — METOPROLOL TARTRATE 25 MG PO TABS
25.0000 mg | ORAL_TABLET | Freq: Two times a day (BID) | ORAL | Status: DC
  Administered 2018-07-21 – 2018-07-23 (×5): 25 mg via ORAL
  Filled 2018-07-21 (×5): qty 1

## 2018-07-21 NOTE — Progress Notes (Signed)
Patient back in sinus rhythm.

## 2018-07-21 NOTE — Progress Notes (Signed)
CARDIAC REHAB PHASE I   PRE:  Rate/Rhythm: 80 SR    BP: sitting 119/83    SaO2: 96 RA  MODE:  Ambulation: 900 ft   POST:  Rate/Rhythm: 86 SR    BP: sitting 134/73     SaO2: 99 RA  Pt in bathroom, walking around room. Used RW in hall. Slow, steady pace. Reminded pt to stay inside RW on turns. Only c/o is running nose. To recliner after walk.  [phone]  Harriet Masson CES, ACSM 07/21/2018 2:04 PM

## 2018-07-21 NOTE — Care Management Important Message (Signed)
Important Message  Patient Details  Name: Nicholas Mills MRN: [id number] Date of Birth: [birth year]   Medicare Important Message Given:  Yes    Shermar Friedland Stefan Church 07/21/2018, 3:45 PM

## 2018-07-21 NOTE — Progress Notes (Signed)
Pt converted to afib RVR rates 130s-140s while ambulating to bathroom around 0530. BP stable and pt is asymptomatic. Notified Dr Laneta Simmers and verbal order for x1 amio bolus, 400mg  amio PO BID w/ 1st dose given now, and a 2nd amio bolus to be given in an hour if heart rate stayed elevated. Will continue to monitor pt and updated day shift RN.  Margarito Liner, RN

## 2018-07-21 NOTE — Progress Notes (Signed)
Inpatient Diabetes Program Recommendations  AACE/ADA: New Consensus Statement on Inpatient Glycemic Control (2015)  Target Ranges:  Prepandial:   less than 140 mg/dL      Peak postprandial:   less than 180 mg/dL (1-2 hours)      Critically ill patients:  140 - 180 mg/dL   Lab Results  Component Value Date   GLUCAP 285 (H) 07/21/2018   HGBA1C 8.1 (H) 07/11/2018    Review of Glycemic Control Results for Nicholas Mills, Nicholas Mills (MRN [id number]) as of 07/21/2018 14:48  Ref. Range 07/20/2018 17:00 07/20/2018 21:47 07/21/2018 06:21 07/21/2018 12:22 07/21/2018 13:31  Glucose-Capillary Latest Ref Range: 70 - 99 mg/dL 902 (H) 409 (H) 92 735 (H) 285 (H)   Inpatient Diabetes Program Recommendations:   Noted postprandial CBGs elevated. Please Consider adding Novolog meal coverage 3 units tid with meals (hold if patient eats less than 50%).   Thank you, Billy Fischer. Martisha Toulouse, RN, MSN, CDE  Diabetes Coordinator Inpatient Glycemic Control Team Team Pager [phone] (8am-5pm) 07/21/2018 2:51 PM

## 2018-07-21 NOTE — Progress Notes (Addendum)
      [address]       [place] [address]             [phone]        3 Days Post-Op Procedure(s) (LRB): CORONARY ARTERY BYPASS GRAFTING (CABG) X 5 USING LEFT INTERNAL MAMMARY ARTERY AND RIGHT SAPHENOUS VEIN HARVESTED ENDOSCOPICALLY (N/A) TRANSESOPHAGEAL ECHOCARDIOGRAM (TEE) (N/A)  Subjective: Patient tried having bowel movement this am, but not successful  Objective: Vital signs in last 24 hours: Temp:  [97.4 F (36.3 C)-98.6 F (37 C)] 98.5 F (36.9 C) (06/01 0505) Pulse Rate:  [79-106] 94 (05/31 2049) Cardiac Rhythm: Atrial fibrillation (06/01 0513) Resp:  [19-27] 23 (06/01 0505) BP: (111-136)/(67-90) 132/83 (06/01 0505) SpO2:  [91 %-96 %] 95 % (06/01 0505) Weight:  [75.5 kg] 75.5 kg (06/01 0505)  Pre op weight 75.3 kg Current Weight  07/21/18 75.5 kg       Intake/Output from previous day: 05/31 0701 - 06/01 0700 In: 720 [P.O.:720] Out: 2040 [Urine:2040]   Physical Exam:  Cardiovascular: IRRR IRRR Pulmonary: Clear to auscultation bilaterally Abdomen: Soft, non tender, bowel sounds present. Extremities: Trace  bilateral lower extremity edema. Wounds: Sternal dressing removed and wound is clean and dry.  No erythema or signs of infection.  Lab Results: CBC: Recent Labs    07/19/18 1617 07/20/18 0257  WBC 12.8* 10.5  HGB 12.0* 10.7*  HCT 35.9* 32.0*  PLT 188 168   BMET:  Recent Labs    07/20/18 0257 07/21/18 0535  NA 140 138  K 3.8 3.6  CL 108 107  CO2 24 24  GLUCOSE 114* 116*  BUN 14 16  CREATININE 0.99 0.95  CALCIUM 8.3* 8.9    PT/INR:  Lab Results  Component Value Date   INR 1.2 07/18/2018   INR 1.1 07/17/2018   INR 1.2 07/11/2018   ABG:  INR: Will add last result for INR, ABG once components are confirmed Will add last 4 CBG results once components are confirmed  Assessment/Plan:  1. CV - He went into a fib with RVR earlier this am. Remains in a fib with RVR. Will give IV Lopressor, increase scheduled  Lopressor to 25 mg bid. Continue  Amiodarone 400 mg bid. May need to give additional IV Amiodarone bolus as well. 2.  Pulmonary - On room air. Encourage incentive spirometer 3. Volume Overload - On Lasix 40 mg daily 4.  Acute blood loss anemia - Last H and H decreased to10.7 and 32. 5. Supplement potassium 6. DM-CBGs 319/148/92 . On Insulin. He was on Metformin XR 750 mg bid prior to admission. Will restart at discharge. Pre op HGA1C 8.1. He will need close medical follow up after discharge. 7. Remove EPW in am 8. Per patient request, LOC in am if no bowel movement today  Donielle M ZimmermanPA-C 07/21/2018,7:10 AM    Chart reviewed, patient examined, agree with above. Had atrial fib with RVR this am 150's and did not feel well with that. Converted with amio and lopressor and feels fine now. No further episodes today since amio started. [phone]

## 2018-07-22 ENCOUNTER — Inpatient Hospital Stay (HOSPITAL_COMMUNITY): Payer: Medicare Other

## 2018-07-22 LAB — GLUCOSE, CAPILLARY
Glucose-Capillary: 98 mg/dL (ref 70–99)
Glucose-Capillary: 281 mg/dL — ABNORMAL HIGH (ref 70–99)
Glucose-Capillary: 129 mg/dL — ABNORMAL HIGH (ref 70–99)
Glucose-Capillary: 227 mg/dL — ABNORMAL HIGH (ref 70–99)

## 2018-07-22 MED ORDER — METFORMIN HCL ER 500 MG PO TB24
500.0000 mg | ORAL_TABLET | Freq: Two times a day (BID) | ORAL | Status: DC
  Administered 2018-07-22 – 2018-07-23 (×3): 500 mg via ORAL
  Filled 2018-07-22 (×3): qty 1

## 2018-07-22 MED ORDER — AMIODARONE HCL 200 MG PO TABS
200.0000 mg | ORAL_TABLET | Freq: Two times a day (BID) | ORAL | Status: DC
  Administered 2018-07-22 – 2018-07-23 (×3): 200 mg via ORAL
  Filled 2018-07-22 (×3): qty 1

## 2018-07-22 MED FILL — Magnesium Sulfate Inj 50%: INTRAMUSCULAR | Qty: 40 | Status: AC

## 2018-07-22 MED FILL — Magnesium Sulfate Inj 50%: INTRAMUSCULAR | Qty: 10 | Status: AC

## 2018-07-22 MED FILL — Heparin Sodium (Porcine) Inj 1000 Unit/ML: INTRAMUSCULAR | Qty: 30 | Status: AC

## 2018-07-22 MED FILL — Potassium Chloride Inj 2 mEq/ML: INTRAVENOUS | Qty: 40 | Status: AC

## 2018-07-22 NOTE — Progress Notes (Signed)
CARDIAC REHAB PHASE I   PRE:  Rate/Rhythm: 77 SR    BP: sitting 133/80    SaO2: 98 RA  MODE:  Ambulation: 750 ft   POST:  Rate/Rhythm: 104 ST    BP: sitting 117/59     SaO2: 95 RA  Pt moving well. Slight assist to stand, ambulated independently without RW. Slow pace, no c/o. To recliner, VSS. Encouraged x2 more walks and IS today. He has already watched d/c video. [phone]   Harriet Masson CES, ACSM 07/22/2018 8:50 AM

## 2018-07-22 NOTE — Progress Notes (Addendum)
      [address]       [place] [address]             [phone]        4 Days Post-Op Procedure(s) (LRB): CORONARY ARTERY BYPASS GRAFTING (CABG) X 5 USING LEFT INTERNAL MAMMARY ARTERY AND RIGHT SAPHENOUS VEIN HARVESTED ENDOSCOPICALLY (N/A) TRANSESOPHAGEAL ECHOCARDIOGRAM (TEE) (N/A)  Subjective: Patient had a large bowel movement yesterday. He has no specific complaints this am.  Objective: Vital signs in last 24 hours: Temp:  [97.6 F (36.4 C)-98.7 F (37.1 C)] 98.7 F (37.1 C) (06/02 0332) Pulse Rate:  [66-78] 71 (06/02 0332) Cardiac Rhythm: Normal sinus rhythm;Bundle branch block (06/02 0700) Resp:  [17-22] 19 (06/02 0332) BP: (105-137)/(63-97) 133/82 (06/02 0332) SpO2:  [90 %-99 %] 95 % (06/02 0332) Weight:  [75.9 kg] 75.9 kg (06/02 0500)  Pre op weight 75.3 kg Current Weight  07/22/18 75.9 kg       Intake/Output from previous day: 06/01 0701 - 06/02 0700 In: 1670 [P.O.:1470; I.V.:200] Out: 625 [Urine:625]   Physical Exam:  Cardiovascular: RRR Pulmonary: Clear to auscultation bilaterally Abdomen: Soft, non tender, bowel sounds present. Extremities: Trace  bilateral lower extremity edema. Wounds: Sternal dressing removed and wound is clean and dry.  No erythema or signs of infection.  Lab Results: CBC: Recent Labs    07/19/18 1617 07/20/18 0257  WBC 12.8* 10.5  HGB 12.0* 10.7*  HCT 35.9* 32.0*  PLT 188 168   BMET:  Recent Labs    07/20/18 0257 07/21/18 0535  NA 140 138  K 3.8 3.6  CL 108 107  CO2 24 24  GLUCOSE 114* 116*  BUN 14 16  CREATININE 0.99 0.95  CALCIUM 8.3* 8.9    PT/INR:  Lab Results  Component Value Date   INR 1.2 07/18/2018   INR 1.1 07/17/2018   INR 1.2 07/11/2018   ABG:  INR: Will add last result for INR, ABG once components are confirmed Will add last 4 CBG results once components are confirmed  Assessment/Plan:  1. CV - He went into a fib with RVR yesterday am and converted to SR after 2  boluses of IV Amiodarone and IV Lopressor.  He remains in SR with HR in the 60's this am. On Amiodarone 400 mg bid and Lopressor 25 mg bid. Will decrease Amiodarone to 200 mg bid. 2.  Pulmonary - On room air. CXR this am appears to show bibasilar atelectasis and small effusions. Encourage incentive spirometer 3. Volume Overload - On Lasix 40 mg daily 4.  Acute blood loss anemia - Last H and H decreased to10.7 and 32. 5. DM-CBGs 320/107/98 . On Insulin. He was on Metformin XR 750 mg bid prior to admission. Will restart at discharge. Pre op HGA1C 8.1. He will need close medical follow up after discharge. 6. Remove EPW  7. Likely discharge in am, as long as maintains SR  Donielle M ZimmermanPA-C 07/22/2018,7:20 AM  [phone]   Chart reviewed, patient examined, agree with above. He feels well.  Maintaining sinus rhythm on amio and Lopressor. Plan home in am.

## 2018-07-22 NOTE — Progress Notes (Signed)
Patient education completed for removal of pacing wires, patient verbalized understanding, pacing wires removed as ordered, procedure well tolerated, patient now on bedrest will continue to monitor.  

## 2018-07-23 LAB — GLUCOSE, CAPILLARY: Glucose-Capillary: 110 mg/dL — ABNORMAL HIGH (ref 70–99)

## 2018-07-23 MED ORDER — TRAMADOL HCL 50 MG PO TABS: 50.0000 mg | ORAL_TABLET | Freq: Four times a day (QID) | ORAL | 0 refills | Status: DC | PRN

## 2018-07-23 MED ORDER — POTASSIUM CHLORIDE CRYS ER 20 MEQ PO TBCR: 20.0000 meq | EXTENDED_RELEASE_TABLET | Freq: Two times a day (BID) | ORAL | 0 refills | Status: DC

## 2018-07-23 MED ORDER — FUROSEMIDE 40 MG PO TABS: 40.0000 mg | ORAL_TABLET | Freq: Every day | ORAL | 0 refills | Status: DC

## 2018-07-23 MED ORDER — POTASSIUM CHLORIDE CRYS ER 20 MEQ PO TBCR
20.0000 meq | EXTENDED_RELEASE_TABLET | Freq: Every day | ORAL | Status: DC
  Administered 2018-07-23: 20 meq via ORAL
  Filled 2018-07-23: qty 1

## 2018-07-23 MED ORDER — AMIODARONE HCL 200 MG PO TABS: 200.0000 mg | ORAL_TABLET | Freq: Two times a day (BID) | ORAL | 1 refills | Status: DC

## 2018-07-23 NOTE — Progress Notes (Signed)
Discussed with pt sternal precautions, IS, diet, mobility at home, exercise, and CRPII. Will refer to [place] CRPII. He watched d/c video yesterday. Ready for d/c. [phone] Ethelda Chick CES, ACSM 9:16 AM 07/23/2018

## 2018-07-23 NOTE — Progress Notes (Addendum)
      [address]       [place] [address]             [phone]        5 Days Post-Op Procedure(s) (LRB): CORONARY ARTERY BYPASS GRAFTING (CABG) X 5 USING LEFT INTERNAL MAMMARY ARTERY AND RIGHT SAPHENOUS VEIN HARVESTED ENDOSCOPICALLY (N/A) TRANSESOPHAGEAL ECHOCARDIOGRAM (TEE) (N/A)  Subjective: Patient has no specific complaints this am and is looking forward to going home.  Objective: Vital signs in last 24 hours: Temp:  [97.7 F (36.5 C)-98.7 F (37.1 C)] 98.3 F (36.8 C) (06/03 0414) Pulse Rate:  [64-84] 69 (06/03 0414) Cardiac Rhythm: Normal sinus rhythm;Bundle branch block (06/02 1900) Resp:  [15-26] 18 (06/03 0432) BP: (113-154)/(66-84) 125/84 (06/03 0414) SpO2:  [95 %-100 %] 97 % (06/03 0414) Weight:  [75.1 kg] 75.1 kg (06/03 0432)  Pre op weight 75.3 kg Current Weight  07/23/18 75.1 kg      Intake/Output from previous day: 06/02 0701 - 06/03 0700 In: 1572 [P.O.:1572] Out: 1020 [Urine:1020]   Physical Exam:  Cardiovascular: RRR Pulmonary: Clear to auscultation bilaterally Abdomen: Soft, non tender, bowel sounds present. Extremities: Trace  bilateral lower extremity edema. Wounds: Both sternal wound and RLE wounds are clean and dry. No erythema or signs of infection.  Lab Results: CBC: No results for input(s): WBC, HGB, HCT, PLT in the last 72 hours. BMET:  Recent Labs    07/21/18 0535  NA 138  K 3.6  CL 107  CO2 24  GLUCOSE 116*  BUN 16  CREATININE 0.95  CALCIUM 8.9    PT/INR:  Lab Results  Component Value Date   INR 1.2 07/18/2018   INR 1.1 07/17/2018   INR 1.2 07/11/2018   ABG:  INR: Will add last result for INR, ABG once components are confirmed Will add last 4 CBG results once components are confirmed  Assessment/Plan:  1. CV - Previous a fib with RVR yesterday am and converted to SR after 2 boluses of IV Amiodarone and IV Lopressor.  He remains in SR with HR in the 70's this am. On Amiodarone 200 mg bid  and Lopressor 25 mg bid.  2.  Pulmonary - On room air. Encourage incentive spirometer 3. Volume Overload - On Lasix 40 mg daily 4.  Acute blood loss anemia - Last H and H decreased to10.7 and 32. 5. DM-CBGs 129/227/110 . He is on Metformin XR 500 mg bid and will increase to pre op dose of 750 mg bid prior to admission. Pre op HGA1C 8.1. He will need close medical follow up after discharge. 6. Chest tube sutures to remain. He will remove at home in one week. 7. Discharge  Lelon Huh [facility] 07/23/2018,7:12 AM  [phone]   Chart reviewed, patient examined, agree with above. Doing well overall. Rhythm is stable in sinus. Plan home today.

## 2018-07-23 NOTE — Discharge Instructions (Signed)

## 2018-07-23 NOTE — Progress Notes (Signed)
Pt discharged home with son. IV and telemetry box removed. Pt received discharge instructions and all questions were answered. Pt was instructed to pick up his meds at his pharmacy. Pt verbalized understanding. Pt received supplies to remove chest tube sutures in 1 week, as instructed by PA. Pt verbalized understanding on how to do this. Pt left with all of his belongings. Pt discharged via wheelchair and was accompanied by a Ferne Coe.    Ardeen Jourdain BSN, RN

## 2018-07-25 ENCOUNTER — Telehealth: Payer: BC Managed Care – PPO | Admitting: Cardiology

## 2018-07-29 DIAGNOSIS — E782 Mixed hyperlipidemia: Secondary | ICD-10-CM | POA: Diagnosis not present

## 2018-07-29 DIAGNOSIS — I2510 Atherosclerotic heart disease of native coronary artery without angina pectoris: Secondary | ICD-10-CM | POA: Diagnosis not present

## 2018-07-29 DIAGNOSIS — E1169 Type 2 diabetes mellitus with other specified complication: Secondary | ICD-10-CM | POA: Diagnosis not present

## 2018-07-29 DIAGNOSIS — E785 Hyperlipidemia, unspecified: Secondary | ICD-10-CM | POA: Diagnosis not present

## 2018-07-29 MED FILL — Electrolyte-R (PH 7.4) Solution: INTRAVENOUS | Qty: 4000 | Status: AC

## 2018-07-29 MED FILL — Lidocaine HCl Local Soln Prefilled Syringe 100 MG/5ML (2%): INTRAMUSCULAR | Qty: 5 | Status: AC

## 2018-07-29 MED FILL — Sodium Chloride IV Soln 0.9%: INTRAVENOUS | Qty: 2000 | Status: AC

## 2018-07-29 MED FILL — Mannitol IV Soln 20%: INTRAVENOUS | Qty: 500 | Status: AC

## 2018-07-29 MED FILL — Heparin Sodium (Porcine) Inj 1000 Unit/ML: INTRAMUSCULAR | Qty: 40 | Status: AC

## 2018-07-29 MED FILL — Sodium Bicarbonate IV Soln 8.4%: INTRAVENOUS | Qty: 50 | Status: AC

## 2018-08-04 ENCOUNTER — Encounter: Payer: Self-pay | Admitting: *Deleted

## 2018-08-04 DIAGNOSIS — I480 Paroxysmal atrial fibrillation: Secondary | ICD-10-CM | POA: Insufficient documentation

## 2018-08-04 DIAGNOSIS — N189 Chronic kidney disease, unspecified: Secondary | ICD-10-CM

## 2018-08-04 DIAGNOSIS — I2510 Atherosclerotic heart disease of native coronary artery without angina pectoris: Secondary | ICD-10-CM

## 2018-08-04 DIAGNOSIS — Z79899 Other long term (current) drug therapy: Secondary | ICD-10-CM | POA: Insufficient documentation

## 2018-08-04 HISTORY — DX: Chronic kidney disease, unspecified: N18.9

## 2018-08-04 HISTORY — DX: Atherosclerotic heart disease of native coronary artery without angina pectoris: I25.10

## 2018-08-04 HISTORY — DX: Other long term (current) drug therapy: Z79.899

## 2018-08-04 NOTE — Progress Notes (Signed)
Virtual Visit via Video Note   This visit type was conducted due to national recommendations for restrictions regarding the COVID-19 Pandemic (e.g. social distancing) in an effort to limit this patient's exposure and mitigate transmission in our community.  Due to his co-morbid illnesses, this patient is at least at moderate risk for complications without adequate follow up.  This format is felt to be most appropriate for this patient at this time.  All issues noted in this document were discussed and addressed.  A limited physical exam was performed with this format.  Please refer to the patient's chart for his consent to telehealth for [facility].   Date:  08/04/2018   ID:  Nicholas FlesherJohn Mills, DOB 07/05/1950, MRN [id number]  Patient Location: Home Provider Location: Office  PCP:  Lise AuerKhan, Jaber A, MD  Cardiologist:  Norman Herrlich , MD  Electrophysiologist:  None   Evaluation Performed:  Follow-Up Visit  Chief Complaint:  FU after CABG  History of Present Illness:    Nicholas FlesherJohn Mills is a 68 y.o. male with exertional syncope CAD and recent CABG.  He had preoperative elevated troponin and left ventricular dysfunction.  His initial EF of 28% was an ischemic ejection fraction.  He had coronary bypass surgery Dr. Carolyn StareBarto 07/18/2018 left thoracic artery was anastomosed to the left anterior descending vein graft to diagonal and a sequential vein graft to the ramus and obtuse marginal a third vein graft to the posterior descending artery. Other cardiac diagnoses include right bundle branch block and brief postoperative paroxysmal atrial fibrillation treated with amiodarone.  His echocardiogram 07/12/2018 had an ejection fraction of 40 to 45%.  The patient does not have symptoms concerning for COVID-19 infection (fever, chills, cough, or new shortness of breath).   He continues to steadily improve strength and endurance are better very little incisional pain no wound drainage.  No fever chill.  He has on  amiodarone I plan to see him in the office in 4 weeks check an EKG if in sinus rhythm I will withdrawal.  Also recheck an echocardiogram to understand if he needs to be on therapy such as MRA or Entresto depending on his ejection fraction.  I am encouraged that his intraoperative TEE showed only mild left ventricular dysfunction and he has no evidence of heart failure.  He is diabetic his PCP anticipates putting on SGLT2 agents and I would initiate him on Jardia at 10 mg/day Past Medical History:  Diagnosis Date  . Diabetes mellitus without complication (HCC)   . Hyperlipidemia   . Hypertension    Past Surgical History:  Procedure Laterality Date  . CARPAL TUNNEL RELEASE    . CORONARY ARTERY BYPASS GRAFT N/A 07/18/2018   Procedure: CORONARY ARTERY BYPASS GRAFTING (CABG) X 5 USING LEFT INTERNAL MAMMARY ARTERY AND RIGHT SAPHENOUS VEIN HARVESTED ENDOSCOPICALLY;  Surgeon: Alleen BorneBartle, Bryan K, MD;  Location: [facility] OR;  Service: Open Heart Surgery;  Laterality: N/A;  . LEFT HEART CATH AND CORONARY ANGIOGRAPHY N/A 07/15/2018   Procedure: LEFT HEART CATH AND CORONARY ANGIOGRAPHY;  Surgeon: Corky CraftsVaranasi, Jayadeep S, MD;  [facility];  Service: Cardiovascular;  Laterality: N/A;  . TEE WITHOUT CARDIOVERSION N/A 07/18/2018   Procedure: TRANSESOPHAGEAL ECHOCARDIOGRAM (TEE);  Surgeon: Alleen BorneBartle, Bryan K, MD;  [facility];  Service: Open Heart Surgery;  Laterality: N/A;  . VASECTOMY       No outpatient medications have been marked as taking for the 08/05/18 encounter (Appointment) with Baldo Daub,  J, MD.     Allergies:  Patient has no known allergies.   Social History   Tobacco Use  . Smoking status: Former Games developermoker  . Smokeless tobacco: Never Used  Substance Use Topics  . Alcohol use: Not on file    Comment: beer occasionally  . Drug use: Never     Family Hx: The patient's family history includes CAD in his mother; Peripheral vascular disease in his father; Valvular heart disease in his  mother.  ROS:   Please see the history of present illness.     All other systems reviewed and are negative.   Prior CV studies:   The following studies were reviewed today:    Labs/Other Tests and Data Reviewed:    EKG:  An ECG dated 07/21/18 was personally reviewed today and demonstrated:  Atrial fibrillation with rapid ventricular response  Recent Labs: 07/11/2018: B Natriuretic Peptide 628.3; TSH 1.801 07/19/2018: Magnesium 2.0 07/20/2018: Hemoglobin 10.7; Platelets 168 07/21/2018: BUN 16; Creatinine, Ser 0.95; Potassium 3.6; Sodium 138   Recent Lipid Panel Lab Results  Component Value Date/Time   CHOL 150 07/12/2018 03:50 AM   TRIG 369 (H) 07/12/2018 03:50 AM   HDL 33 (L) 07/12/2018 03:50 AM   CHOLHDL 4.5 07/12/2018 03:50 AM   LDLCALC 43 07/12/2018 03:50 AM    Wt Readings from Last 3 Encounters:  07/23/18 165 lb 8 oz (75.1 kg)  07/11/18 171 lb 1.9 oz (77.6 kg)     Objective:    Vital Signs:  There were no vitals taken for this visit.  His blood pressure cuff has a dead battery will be replaced today  VITAL SIGNS:  reviewed GEN:  no acute distress EYES:  sclerae anicteric, EOMI - Extraocular Movements Intact RESPIRATORY:  normal respiratory effort, symmetric expansion CARDIOVASCULAR:  no peripheral edema SKIN:  no rash, lesions or ulcers. MUSCULOSKELETAL:  no obvious deformities. NEURO:  alert and oriented x 3, no obvious focal deficit PSYCH:  normal affect His general appearance is markedly improved from when I had seen him preoperatively when he is ischemic with severe LV dysfunction.  His wounds are healed he has no drainage and no visual signs of infection.  ASSESSMENT & PLAN:    1. CAD stable improved after surgical revascularization continue medical therapy including beta-blocker aspirin statin and I will reassess in my office in 1 month regarding issues of return to work.  He will follow-up surgical visit. 2. Atrial fibrillation perioperative brief  continue amiodarone until follow-up and if in sinus rhythm I will discontinue at that time 3. On amiodarone await follow-up visit if he remains in the drug can need screening for toxicity liver and thyroid 4. Dyslipidemia continue statin check lipid profile liver function office follow-up 5. Right bundle branch block stable pattern on last EKG 6. CKD check renal function office follow-up 7. LV dysfunction the critical issue I suspect his ejection fraction being normal or near normal and will guide whether we need to put him on additional therapy such as Entresto or MRA 8. Heart failure improved no longer in a loop diuretic will check proBNP level at office follow-up  COVID-19 Education: The signs and symptoms of COVID-19 were discussed with the patient and how to seek care for testing (follow up with PCP or arrange E-visit).  The importance of social distancing was discussed today.  Time:   Today, I have spent 20 minutes with the patient with telehealth technology discussing the above problems.     Medication Adjustments/Labs and Tests Ordered: Current medicines are reviewed at  length with the patient today.  Concerns regarding medicines are outlined above.   Tests Ordered: No orders of the defined types were placed in this encounter.   Medication Changes: No orders of the defined types were placed in this encounter.   Follow Up:    in 4 week(s) office  Signed, Shirlee More, MD  08/04/2018 9:53 AM    [place]

## 2018-08-05 ENCOUNTER — Telehealth (INDEPENDENT_AMBULATORY_CARE_PROVIDER_SITE_OTHER): Payer: Medicare Other | Admitting: Cardiology

## 2018-08-05 ENCOUNTER — Encounter: Payer: Self-pay | Admitting: Cardiology

## 2018-08-05 ENCOUNTER — Ambulatory Visit (HOSPITAL_BASED_OUTPATIENT_CLINIC_OR_DEPARTMENT_OTHER)
Admission: RE | Admit: 2018-08-05 | Discharge: 2018-08-05 | Disposition: A | Discharge status: Home / Self Care | Payer: Medicare Other | Source: Ambulatory Visit | Attending: Cardiology | Admitting: Cardiology

## 2018-08-05 ENCOUNTER — Other Ambulatory Visit: Payer: Self-pay

## 2018-08-05 VITALS — Ht 70.0 in | Wt 164.0 lb

## 2018-08-05 DIAGNOSIS — E1169 Type 2 diabetes mellitus with other specified complication: Secondary | ICD-10-CM

## 2018-08-05 DIAGNOSIS — I509 Heart failure, unspecified: Secondary | ICD-10-CM

## 2018-08-05 DIAGNOSIS — I4510 Unspecified right bundle-branch block: Secondary | ICD-10-CM

## 2018-08-05 DIAGNOSIS — J9811 Atelectasis: Secondary | ICD-10-CM | POA: Diagnosis not present

## 2018-08-05 DIAGNOSIS — I25118 Atherosclerotic heart disease of native coronary artery with other forms of angina pectoris: Secondary | ICD-10-CM

## 2018-08-05 DIAGNOSIS — Z79899 Other long term (current) drug therapy: Secondary | ICD-10-CM

## 2018-08-05 DIAGNOSIS — Z951 Presence of aortocoronary bypass graft: Secondary | ICD-10-CM

## 2018-08-05 DIAGNOSIS — I519 Heart disease, unspecified: Secondary | ICD-10-CM

## 2018-08-05 DIAGNOSIS — K4090 Unilateral inguinal hernia, without obstruction or gangrene, not specified as recurrent: Secondary | ICD-10-CM | POA: Diagnosis not present

## 2018-08-05 DIAGNOSIS — N189 Chronic kidney disease, unspecified: Secondary | ICD-10-CM

## 2018-08-05 DIAGNOSIS — I480 Paroxysmal atrial fibrillation: Secondary | ICD-10-CM

## 2018-08-05 NOTE — Patient Instructions (Signed)
Medication Instructions:  Your physician recommends that you continue on your current medications as directed. Please refer to the Current Medication list given to you today.  If you need a refill on your cardiac medications before your next appointment, please call your pharmacy.   Lab work: None If you have labs (blood work) drawn today and your tests are completely normal, you will receive your results only by: Marland Kitchen. MyChart Message (if you have MyChart) OR . A paper copy in the mail If you have any lab test that is abnormal or we need to change your treatment, we will call you to review the results.  Testing/Procedures: Your physician has requested that you have an echocardiogram. Echocardiography is a painless test that uses sound waves to create images of your heart. It provides your doctor with information about the size and shape of your heart and how well your heart's chambers and valves are working. This procedure takes approximately one hour. There are no restrictions for this procedure.  YOUR APPOINTMENT FOR YOUR ECHO IS: July 10 at 8:15 at [facility]  Follow-Up: At [facility], you and your health needs are our priority.  As part of our continuing mission to provide you with exceptional heart care, we have created designated Provider Care Teams.  These Care Teams include your primary Cardiologist (physician) and Advanced Practice Providers (APPs -  Physician Assistants and Nurse Practitioners) who all work together to provide you with the care you need, when you need it. You will need a follow up appointment in 8 weeks. Any Other Special Instructions Will Be Listed Below (If Applicable).   Echocardiogram An echocardiogram is a procedure that uses painless sound waves (ultrasound) to produce an image of the heart. Images from an echocardiogram can provide important information about:  Signs of coronary artery disease (CAD).  Aneurysm detection. An aneurysm is a weak or  damaged part of an artery wall that bulges out from the normal force of blood pumping through the body.  Heart size and shape. Changes in the size or shape of the heart can be associated with certain conditions, including heart failure, aneurysm, and CAD.  Heart muscle function.  Heart valve function.  Signs of a past heart attack.  Fluid buildup around the heart.  Thickening of the heart muscle.  A tumor or infectious growth around the heart valves. Tell a health care provider about:  Any allergies you have.  All medicines you are taking, including vitamins, herbs, eye drops, creams, and over-the-counter medicines.  Any blood disorders you have.  Any surgeries you have had.  Any medical conditions you have.  Whether you are pregnant or may be pregnant. What are the risks? Generally, this is a safe procedure. However, problems may occur, including:  Allergic reaction to dye (contrast) that may be used during the procedure. What happens before the procedure? No specific preparation is needed. You may eat and drink normally. What happens during the procedure?   An IV tube may be inserted into one of your veins.  You may receive contrast through this tube. A contrast is an injection that improves the quality of the pictures from your heart.  A gel will be applied to your chest.  A wand-like tool (transducer) will be moved over your chest. The gel will help to transmit the sound waves from the transducer.  The sound waves will harmlessly bounce off of your heart to allow the heart images to be captured in real-time motion. The  images will be recorded on a computer. The procedure may vary among health care providers and hospitals. What happens after the procedure?  You may return to your normal, everyday life, including diet, activities, and medicines, unless your health care provider tells you not to do that. Summary  An echocardiogram is a procedure that uses painless  sound waves (ultrasound) to produce an image of the heart.  Images from an echocardiogram can provide important information about the size and shape of your heart, heart muscle function, heart valve function, and fluid buildup around your heart.  You do not need to do anything to prepare before this procedure. You may eat and drink normally.  After the echocardiogram is completed, you may return to your normal, everyday life, unless your health care provider tells you not to do that. This information is not intended to replace advice given to you by your health care provider. Make sure you discuss any questions you have with your health care provider. Document Released: 02/03/2000 Document Revised: 03/10/2016 Document Reviewed: 03/10/2016 Elsevier Interactive Patient Education  2019 [employer].

## 2018-08-05 NOTE — Addendum Note (Signed)
Addended by: Austin Miles on: 08/05/2018 11:55 AM   Modules accepted: Orders

## 2018-08-12 ENCOUNTER — Other Ambulatory Visit: Payer: Self-pay

## 2018-08-12 ENCOUNTER — Other Ambulatory Visit: Payer: Self-pay | Admitting: Surgery

## 2018-08-12 DIAGNOSIS — Z951 Presence of aortocoronary bypass graft: Secondary | ICD-10-CM

## 2018-08-13 ENCOUNTER — Ambulatory Visit (INDEPENDENT_AMBULATORY_CARE_PROVIDER_SITE_OTHER): Payer: Self-pay | Admitting: Surgical

## 2018-08-13 VITALS — BP 124/72 | HR 60 | Temp 97.5°F | Resp 16 | Ht 70.0 in | Wt 167.0 lb

## 2018-08-13 DIAGNOSIS — I2510 Atherosclerotic heart disease of native coronary artery without angina pectoris: Secondary | ICD-10-CM

## 2018-08-13 DIAGNOSIS — Z951 Presence of aortocoronary bypass graft: Secondary | ICD-10-CM

## 2018-08-13 DIAGNOSIS — K449 Diaphragmatic hernia without obstruction or gangrene: Secondary | ICD-10-CM | POA: Insufficient documentation

## 2018-08-13 DIAGNOSIS — I480 Paroxysmal atrial fibrillation: Secondary | ICD-10-CM

## 2018-08-13 NOTE — Progress Notes (Signed)
[address]       [place] [address]             [phone]      Chamberlain Radziewicz Fisher Medical Record #[id number] Date of Birth: 1950-02-23  Referring: Mateo Flow, MD Primary Care: Mateo Flow, MD Primary Cardiologist: Shirlee More, MD   Chief Complaint:   POST OP FOLLOW UP 07/18/2018  Surgeon:  Gaye Pollack, MD  First Assistant: Ellwood Handler,  PA-C   Preoperative Diagnosis:  Severe multi-vessel coronary artery disease   Postoperative Diagnosis:  Same   Procedure:  1. Median Sternotomy 2. Extracorporeal circulation 3.   Coronary artery bypass grafting x 5   Left internal mammary graft to the LAD  SVG to diagonal 2  Sequential SVG to Ramus and OM  SVG to PDA 4.   Endoscopic vein harvest from the right leg   Anesthesia:  General Endotracheal   History of Present Illness: The patient is a 69 year old male status post the above described procedure seen in the office on today's date and routine postsurgical follow-up.  He reports that he feels well and continues to increase his ambulation.  He denies chest pain or shortness of breath.  He has had no fevers, chills or other significant constitutional symptoms recently.  He did have some nausea early post discharge but this has resolved.  He does complain of some left foot pain from a recent fall which is improving.  He has had foot pain previously in the MP joint and these symptoms do sound as though they could possibly be gout.   He is not requiring any pain medication at this time.  Overall he feels as though he is making excellent progress.      Past Medical History:  Diagnosis Date  . Coronary artery disease   . Diabetes mellitus without complication ([place])   . Hyperlipidemia   . Hypertension   . Right bundle branch block   . Syncope      Social History   Tobacco Use  Smoking Status Former Smoker  Smokeless Tobacco Never Used    Social History    Substance and Sexual Activity  Alcohol Use None   Comment: beer occasionally     No Known Allergies  Current Outpatient Medications  Medication Sig Dispense Refill  . amiodarone (PACERONE) 200 MG tablet Take 1 tablet (200 mg total) by mouth 2 (two) times daily. For 4 days then take Amiodarone 200 mg daily thereafter 60 tablet 1  . aspirin EC 81 MG tablet Take 325 mg by mouth daily.     . Coenzyme Q10 (COQ10) 100 MG CAPS Take 100 mg by mouth 2 (two) times a day.     . esomeprazole (NEXIUM) 20 MG capsule Take 20 mg by mouth 2 (two) times daily before a meal.     . metFORMIN (GLUCOPHAGE-XR) 750 MG 24 hr tablet Take 750 mg by mouth 2 (two) times daily.     . metoprolol tartrate (LOPRESSOR) 25 MG tablet Take 1 tablet (25 mg total) by mouth 2 (two) times daily. 180 tablet 1  . Red Yeast Rice 600 MG CAPS Take 600 mg by mouth 2 (two) times a day.     . Tiotropium Bromide Monohydrate (SPIRIVA HANDIHALER IN) Inhale 1 puff into the lungs daily.    . traMADol (ULTRAM) 50 MG tablet Take 1 tablet (50 mg total) by mouth every 6 (six) hours as needed for moderate  pain. 30 tablet 0   No current facility-administered medications for this visit.        Physical Exam: Ht 5\' 10"  (1.778 m)   BMI 23.53 kg/m   General appearance: alert, cooperative and no distress Heart: regular rate and rhythm Lungs: clear to auscultation bilaterally Abdomen: Benign exam Extremities: No edema Wound: Incisions healing well without evidence of infection   Diagnostic Studies & Laboratory data:     Recent Radiology Findings:   No results found.    Recent Lab Findings: Lab Results  Component Value Date   WBC 10.5 07/20/2018   HGB 10.7 (L) 07/20/2018   HCT 32.0 (L) 07/20/2018   PLT 168 07/20/2018   GLUCOSE 116 (H) 07/21/2018   CHOL 150 07/12/2018   TRIG 369 (H) 07/12/2018   HDL 33 (L) 07/12/2018   LDLCALC 43 07/12/2018   NA 138 07/21/2018   K 3.6 07/21/2018   CL 107 07/21/2018   CREATININE 0.95  07/21/2018   BUN 16 07/21/2018   CO2 24 07/21/2018   TSH 1.801 07/11/2018   INR 1.2 07/18/2018   HGBA1C 8.1 (H) 07/11/2018      Assessment / Plan: The patient is doing quite well.  There are no current surgical issues at this time.  He did have a recent chest x-ray which does reveal a large hiatal hernia, however this is well known to the patient for several years and other than occasional indigestion he is relatively asymptomatic from this.  I did discuss the possibility that his foot pain could be related to gout and he will follow-up with his primary physician for this.  He has seen cardiology.  I encouraged him to get a new blood glucose meter as he is not currently checking his sugars and he continues to be on Glucophage.  We discussed routine activity progression and lifestyle management/nutrition issues.  He does understand the importance of managing diabetes as it relates to cardiac disease.  I did not make any medication changes at this time.  We will see the patient again on a as needed basis for any surgically related issues or at request.      Medication Changes: No orders of the defined types were placed in this encounter.     Rowe ClackWayne E Shameka Aggarwal, PA-C 08/13/2018 12:23 PM

## 2018-08-13 NOTE — Patient Instructions (Signed)
Make every effort to keep your diabetes under very tight control.  Follow up closely with your primary care physician or endocrinologist and strive to keep their hemoglobin A1c levels as low as possible, preferably near or below 6.0.  The long term benefits of strict control of diabetes are far reaching and critically important for your overall health and survival.You may return to driving an automobile as long as you are no longer requiring oral narcotic pain relievers during the daytime.  It would be wise to start driving only short distances during the Make every effort to keep your diabetes under very tight control.  Follow up closely with your primary care physician or endocrinologist and strive to keep their hemoglobin A1c levels as low as possible, preferably near or below 6.0.  The long term benefits of strict control of diabetes are far reaching and critically important for your overall health and survival.You may return to driving an automobile as long as you are no longer requiring oral narcotic pain relievers during the daytime.  It would be wise to start driving only short distances during the daylight and gradually increase from there as you feel comfortable.Make every effort to stay physically active, get some type of exercise on a regular basis, and stick to a "heart healthy diet".  The long term benefits for regular exercise and a healthy diet are critically important to your overall health and wellbeing.Make every effort to maintain a "heart-healthy" lifestyle with regular physical exercise and adherence to a low-fat, low-carbohydrate diet.  Continue to seek regular follow-up appointments with your primary care physician and/or cardiologist.You may continue to gradually increase your physical activity as tolerated.  Refrain from any heavy lifting or strenuous use of your arms and shoulders until at least 8 weeks from the time of your surgery, and avoid activities that cause increased pain in your  chest on the side of your surgical incision.  Otherwise you may continue to increase activities without any particular limitations.  Increase the intensity and duration of physical activity gradually.Continue all previous medications without any changes at this timedaylight and gradually increase from there as you feel comfortable.

## 2018-08-18 ENCOUNTER — Ambulatory Visit: Payer: Medicare Other

## 2018-08-28 DIAGNOSIS — Z951 Presence of aortocoronary bypass graft: Secondary | ICD-10-CM | POA: Diagnosis not present

## 2018-08-28 DIAGNOSIS — I10 Essential (primary) hypertension: Secondary | ICD-10-CM | POA: Diagnosis not present

## 2018-08-28 DIAGNOSIS — K219 Gastro-esophageal reflux disease without esophagitis: Secondary | ICD-10-CM | POA: Diagnosis not present

## 2018-08-28 DIAGNOSIS — E119 Type 2 diabetes mellitus without complications: Secondary | ICD-10-CM | POA: Diagnosis not present

## 2018-08-28 DIAGNOSIS — E782 Mixed hyperlipidemia: Secondary | ICD-10-CM | POA: Diagnosis not present

## 2018-08-29 ENCOUNTER — Ambulatory Visit (INDEPENDENT_AMBULATORY_CARE_PROVIDER_SITE_OTHER): Payer: Medicare Other

## 2018-08-29 ENCOUNTER — Other Ambulatory Visit: Payer: Self-pay

## 2018-08-29 DIAGNOSIS — I519 Heart disease, unspecified: Secondary | ICD-10-CM

## 2018-08-29 DIAGNOSIS — I509 Heart failure, unspecified: Secondary | ICD-10-CM

## 2018-08-29 NOTE — Progress Notes (Signed)
Complete echocardiogram has been performed.  Jimmy Niobe Dick RDCS, RVT 

## 2018-09-03 ENCOUNTER — Telehealth: Payer: Self-pay | Admitting: Cardiology

## 2018-09-03 NOTE — Telephone Encounter (Signed)
Patient informed that he can start cardiac rehab as scheduled on Friday, 09/05/2018. Patient verbalized understanding. No further questions.

## 2018-09-03 NOTE — Telephone Encounter (Signed)
Please advise. Thanks.  

## 2018-09-03 NOTE — Telephone Encounter (Signed)
Patient is asking if it is ok for him to start cardiac rehab?

## 2018-09-03 NOTE — Telephone Encounter (Signed)
yes

## 2018-09-05 DIAGNOSIS — E119 Type 2 diabetes mellitus without complications: Secondary | ICD-10-CM | POA: Diagnosis not present

## 2018-09-05 DIAGNOSIS — K219 Gastro-esophageal reflux disease without esophagitis: Secondary | ICD-10-CM | POA: Diagnosis not present

## 2018-09-05 DIAGNOSIS — E782 Mixed hyperlipidemia: Secondary | ICD-10-CM | POA: Diagnosis not present

## 2018-09-05 DIAGNOSIS — I10 Essential (primary) hypertension: Secondary | ICD-10-CM | POA: Diagnosis not present

## 2018-09-05 DIAGNOSIS — Z951 Presence of aortocoronary bypass graft: Secondary | ICD-10-CM | POA: Diagnosis not present

## 2018-09-10 DIAGNOSIS — E119 Type 2 diabetes mellitus without complications: Secondary | ICD-10-CM | POA: Diagnosis not present

## 2018-09-10 DIAGNOSIS — I10 Essential (primary) hypertension: Secondary | ICD-10-CM | POA: Diagnosis not present

## 2018-09-10 DIAGNOSIS — E782 Mixed hyperlipidemia: Secondary | ICD-10-CM | POA: Diagnosis not present

## 2018-09-10 DIAGNOSIS — K219 Gastro-esophageal reflux disease without esophagitis: Secondary | ICD-10-CM | POA: Diagnosis not present

## 2018-09-10 DIAGNOSIS — Z951 Presence of aortocoronary bypass graft: Secondary | ICD-10-CM | POA: Diagnosis not present

## 2018-09-12 DIAGNOSIS — K219 Gastro-esophageal reflux disease without esophagitis: Secondary | ICD-10-CM | POA: Diagnosis not present

## 2018-09-12 DIAGNOSIS — Z951 Presence of aortocoronary bypass graft: Secondary | ICD-10-CM | POA: Diagnosis not present

## 2018-09-12 DIAGNOSIS — I10 Essential (primary) hypertension: Secondary | ICD-10-CM | POA: Diagnosis not present

## 2018-09-12 DIAGNOSIS — E782 Mixed hyperlipidemia: Secondary | ICD-10-CM | POA: Diagnosis not present

## 2018-09-12 DIAGNOSIS — E119 Type 2 diabetes mellitus without complications: Secondary | ICD-10-CM | POA: Diagnosis not present

## 2018-09-19 DIAGNOSIS — E119 Type 2 diabetes mellitus without complications: Secondary | ICD-10-CM | POA: Diagnosis not present

## 2018-09-19 DIAGNOSIS — Z951 Presence of aortocoronary bypass graft: Secondary | ICD-10-CM | POA: Diagnosis not present

## 2018-09-19 DIAGNOSIS — K219 Gastro-esophageal reflux disease without esophagitis: Secondary | ICD-10-CM | POA: Diagnosis not present

## 2018-09-19 DIAGNOSIS — I10 Essential (primary) hypertension: Secondary | ICD-10-CM | POA: Diagnosis not present

## 2018-09-19 DIAGNOSIS — E782 Mixed hyperlipidemia: Secondary | ICD-10-CM | POA: Diagnosis not present

## 2018-09-22 DIAGNOSIS — Z951 Presence of aortocoronary bypass graft: Secondary | ICD-10-CM | POA: Diagnosis not present

## 2018-09-23 NOTE — Progress Notes (Signed)
Cardiology Office Note:    Date:  09/24/2018   ID:  Nicholas Mills., DOB 08/16/1950, MRN [id number]  PCP:  Mateo Flow, MD  Cardiologist:  Shirlee More, MD    Referring MD: Mateo Flow, MD    ASSESSMENT:    1. Coronary artery disease of native artery of native heart with stable angina pectoris ([place])   2. RBBB (right bundle branch block)   3. PAF (paroxysmal atrial fibrillation) ([place])   4. Dyslipidemia associated with type 2 diabetes mellitus ([place])    PLAN:    In order of problems listed above:  1. CAD s/p CABG 07/18/18 -favorable course [place] Heart Association class I finished cardiac rehabilitation reduce aspirin at 3 months to 81 mg daily continue statin stop red rice yeast check liver function and lipids. 2. Heart failure - Echo 08/29/18 with EF 40-45%, mild LVH, restrictive diastolic filling, LV global hypokinesis, RV mildly reduced systolic function and mildly enlarged, LA mod dilated, mod MR/TR. market improvement with a mildly reduced EF 3. RBBB -stable pattern 4. PAF - Briefly after CABG treated with amiodarone.  Discontinue amiodarone   Next appointment: 3 months   Medication Adjustments/Labs and Tests Ordered: Current medicines are reviewed at length with the patient today.  Concerns regarding medicines are outlined above.  No orders of the defined types were placed in this encounter.  No orders of the defined types were placed in this encounter.   Chief Complaint  Patient presents with   Follow up on Echo   Follow-up   Coronary Artery Disease    History of Present Illness:    [name]. is a 68 y.o. male with a hx of exertional syncope CAD and recent CABG on 07/18/18.  He had preoperative elevated troponin and left ventricular dysfunction.  His initial EF of 28% was an ischemic ejection fraction.  He had coronary bypass surgery 07/18/2018 left thoracic artery was anastomosed to the left anterior descending vein graft to diagonal and a  sequential vein graft to the ramus and obtuse marginal a third vein graft to the posterior descending artery. Other cardiac diagnoses include RBBB and brief postoperative paroxysmal atrial fibrillation treated with amiodarone.  Additional medical history includes DM2, HLD, HTN, hiatal hernia. His echocardiogram 07/12/2018 had an ejection fraction of 40 to 45%. Echo 08/29/18 with EF 40-45%, mild LVH. He was last seen 08/04/2018.  Seen today for evaluation of blood pressure and consideration of Entresto in the setting of continued reduced EF.   Compliance with diet, lifestyle and medications: Yes  Her blood pressure typically runs 120s to 130s 70-80.  His complaints are mostly change in taste and some urinary hesitancy.  I think that the first is related to amiodarone he will stop today and for completeness check liver function.  The second is related to prostate problem he took saw palmetto in the past I asked him to resume and if unimproved may need to see a urologist.  We discussed his mildly reduced ejection fraction I advised him to try [facility] and will put him on 2426 and I will see him in 3 months if continue to follow BP at home.  No palpitations syncope chest pain or shortness of breath Past Medical History:  Diagnosis Date   Coronary artery disease    a. 07/15/18 cardiac cath multivessel CAD, CT surgery consulted b.    Diabetes mellitus without complication ([place])    Hiatal hernia    Hx of CABG  a. 07/18/18 LIMA to LAD - SVG to diagonal 2 - SVG to Ramus and OM - SVG to PDA   Hyperlipidemia    Hypertension    PAF (paroxysmal atrial fibrillation) ([facility])    a. briefly post op CABG 07/18/18 treated with amiodarone   Right bundle branch block    Syncope     Past Surgical History:  Procedure Laterality Date   CARDIAC CATHETERIZATION     CARPAL TUNNEL RELEASE     CORONARY ARTERY BYPASS GRAFT N/A 07/18/2018   Procedure: CORONARY ARTERY BYPASS GRAFTING (CABG) X 5 USING LEFT  INTERNAL MAMMARY ARTERY AND RIGHT SAPHENOUS VEIN HARVESTED ENDOSCOPICALLY;  Surgeon: Alleen BorneBartle, Bryan K, MD;  Location: [facility] OR;  Service: Open Heart Surgery;  Laterality: N/A;   LEFT HEART CATH AND CORONARY ANGIOGRAPHY N/A 07/15/2018   Procedure: LEFT HEART CATH AND CORONARY ANGIOGRAPHY;  Surgeon: Corky CraftsVaranasi, Jayadeep S, MD;  [facility];  Service: Cardiovascular;  Laterality: N/A;   TEE WITHOUT CARDIOVERSION N/A 07/18/2018   Procedure: TRANSESOPHAGEAL ECHOCARDIOGRAM (TEE);  Surgeon: Alleen BorneBartle, Bryan K, MD;  [facility];  Service: Open Heart Surgery;  Laterality: N/A;   VASECTOMY      Current Medications: Current Meds  Medication Sig   aspirin EC 81 MG tablet Take 325 mg by mouth daily.    Coenzyme Q10 (COQ10) 100 MG CAPS Take 100 mg by mouth 2 (two) times a day.    metFORMIN (GLUCOPHAGE-XR) 750 MG 24 hr tablet Take 750 mg by mouth 2 (two) times daily.    metoprolol tartrate (LOPRESSOR) 25 MG tablet Take 1 tablet (25 mg total) by mouth 2 (two) times daily.   Red Yeast Rice 600 MG CAPS Take 600 mg by mouth 2 (two) times a day.    traMADol (ULTRAM) 50 MG tablet Take 1 tablet (50 mg total) by mouth every 6 (six) hours as needed for moderate pain.   [DISCONTINUED] amiodarone (PACERONE) 200 MG tablet Take 1 tablet (200 mg total) by mouth 2 (two) times daily. For 4 days then take Amiodarone 200 mg daily thereafter (Patient taking differently: Take 200 mg by mouth daily. )     Allergies:   Patient has no known allergies.   Social History   Socioeconomic History   Marital status: Divorced    Spouse name: Not on file   Number of children: Not on file   Years of education: Not on file   Highest education level: Not on file  Occupational History   Not on file  Social Needs   Financial resource strain: Not on file   Food insecurity    Worry: Not on file    Inability: Not on file   Transportation needs    Medical: Not on file    Non-medical: Not on file  Tobacco  Use   Smoking status: Former Smoker   Smokeless tobacco: Never Used  Substance and Sexual Activity   Alcohol use: Not on file    Comment: beer occasionally   Drug use: Never   Sexual activity: Not on file  Lifestyle   Physical activity    Days per week: Not on file    Minutes per session: Not on file   Stress: Not on file  Relationships   Social connections    Talks on phone: Not on file    Gets together: Not on file    Attends religious service: Not on file    Active member of club or organization: Not on file  Attends meetings of clubs or organizations: Not on file    Relationship status: Not on file  Other Topics Concern   Not on file  Social History Narrative   Not on file     Family History: The patient's family history includes CAD in his mother; Macular degeneration in his mother; Obesity in his sister; Peripheral vascular disease in his father; Valvular heart disease in his mother. ROS:   Please see the history of present illness.    All other systems reviewed and are negative.  EKGs/Labs/Other Studies Reviewed:    The following studies were reviewed today:  EKG:  EKG ordered today and personally reviewed.  The ekg ordered today demonstrates sinus rhythm stable pattern right bundle branch block normal QT interval  Echo 08/29/2018:     1. The left ventricle has mild-moderately reduced systolic function, with an ejection fraction of 40-45%. The cavity size was normal. There is mild concentric left ventricular hypertrophy. Left ventricular diastolic Doppler parameters are consistent  with restrictive filling. Elevated left atrial and left ventricular end-diastolic pressures Left ventrical global hypokinesis without regional wall motion abnormalities.  2. The right ventricle has mildly reduced systolic function. The cavity was mildly enlarged. There is no increase in right ventricular wall thickness.  3. Left atrial size was moderately dilated.  4. Mitral  valve regurgitation is moderate by color flow Doppler. No evidence of mitral valve stenosis.  5. Tricuspid valve regurgitation is moderate.  6. The aortic valve is tricuspid. Mild thickening of the aortic valve. No stenosis of the aortic valve.  7. The inferior vena cava was dilated in size with <50% respiratory variability.  CABG 07/18/18 Procedure: 1. Median Sternotomy 2. Extracorporeal circulation 3.   Coronary artery bypass grafting x 5    Left internal mammary graft to the LAD  SVG to diagonal 2  Sequential SVG to Ramus and OM  SVG to PDA 4.   Endoscopic vein harvest from the right leg  Recent Labs: 07/11/2018: B Natriuretic Peptide 628.3; TSH 1.801 07/19/2018: Magnesium 2.0 07/20/2018: Hemoglobin 10.7; Platelets 168 07/21/2018: BUN 16; Creatinine, Ser 0.95; Potassium 3.6; Sodium 138  Recent Lipid Panel    Component Value Date/Time   CHOL 150 07/12/2018 0350   TRIG 369 (H) 07/12/2018 0350   HDL 33 (L) 07/12/2018 0350   CHOLHDL 4.5 07/12/2018 0350   VLDL 74 (H) 07/12/2018 0350   LDLCALC 43 07/12/2018 0350    Physical Exam:    VS:  BP 112/62    Pulse (!) 56    Resp 10    Ht 5\' 10"  (1.778 m)    Wt 166 lb (75.3 kg)    BMI 23.82 kg/m     Wt Readings from Last 3 Encounters:  09/24/18 166 lb (75.3 kg)  08/13/18 167 lb (75.8 kg)  08/05/18 164 lb (74.4 kg)     GEN:  Well nourished, well developed in no acute distress HEENT: Normal NECK: No JVD; No carotid bruits LYMPHATICS: No lymphadenopathy CARDIAC: RRR, no murmurs, rubs, gallops RESPIRATORY:  Clear to auscultation without rales, wheezing or rhonchi  ABDOMEN: Soft, non-tender, non-distended MUSCULOSKELETAL:  No edema; No deformity  SKIN: Warm and dry NEUROLOGIC:  Alert and oriented x 3 PSYCHIATRIC:  Normal affect    Signed, Norman HerrlichBrian Hephzibah Strehle, MD  09/24/2018 2:10 PM    [facility]

## 2018-09-24 ENCOUNTER — Ambulatory Visit (INDEPENDENT_AMBULATORY_CARE_PROVIDER_SITE_OTHER): Payer: Medicare Other | Admitting: Cardiology

## 2018-09-24 ENCOUNTER — Encounter: Payer: Self-pay | Admitting: Cardiology

## 2018-09-24 ENCOUNTER — Other Ambulatory Visit: Payer: Self-pay

## 2018-09-24 VITALS — BP 112/62 | HR 56 | Resp 10 | Ht 70.0 in | Wt 166.0 lb

## 2018-09-24 DIAGNOSIS — I25118 Atherosclerotic heart disease of native coronary artery with other forms of angina pectoris: Secondary | ICD-10-CM | POA: Diagnosis not present

## 2018-09-24 DIAGNOSIS — E785 Hyperlipidemia, unspecified: Secondary | ICD-10-CM | POA: Diagnosis not present

## 2018-09-24 DIAGNOSIS — I4510 Unspecified right bundle-branch block: Secondary | ICD-10-CM

## 2018-09-24 DIAGNOSIS — I480 Paroxysmal atrial fibrillation: Secondary | ICD-10-CM

## 2018-09-24 DIAGNOSIS — E1169 Type 2 diabetes mellitus with other specified complication: Secondary | ICD-10-CM | POA: Diagnosis not present

## 2018-09-24 DIAGNOSIS — Z951 Presence of aortocoronary bypass graft: Secondary | ICD-10-CM | POA: Diagnosis not present

## 2018-09-24 MED ORDER — ASPIRIN EC 81 MG PO TBEC: 81.0000 mg | DELAYED_RELEASE_TABLET | Freq: Every day | ORAL | 3 refills | Status: DC

## 2018-09-24 NOTE — Patient Instructions (Signed)
Medication Instructions:  Your physician has recommended you make the following change in your medication:   STOP: Amiodarone STOP: Red Yeast Rice  DECREASE: Aspirin to 81mg  on 10/21/18 START: Saw Palmetto daily    If you need a refill on your cardiac medications before your next appointment, please call your pharmacy.   Lab work: Your physician recommends that you return for lab work in: [place]  If you have labs (blood work) drawn today and your tests are completely normal, you will receive your results only by: Marland Kitchen MyChart Message (if you have MyChart) OR . A paper copy in the mail If you have any lab test that is abnormal or we need to change your treatment, we will call you to review the results.  Testing/Procedures: None  Follow-Up: At [facility], you and your health needs are our priority.  As part of our continuing mission to provide you with exceptional heart care, we have created designated Provider Care Teams.  These Care Teams include your primary Cardiologist (physician) and Advanced Practice Providers (APPs -  Physician Assistants and Nurse Practitioners) who all work together to provide you with the care you need, when you need it. You will need a follow up appointment in 2 months.   Any Other Special Instructions Will Be Listed Below (If Applicable).

## 2018-09-25 LAB — COMPREHENSIVE METABOLIC PANEL
ALT: 7 IU/L (ref 0–44)
AST: 14 IU/L (ref 0–40)
Albumin/Globulin Ratio: 1.7 (ref 1.2–2.2)
Albumin: 4 g/dL (ref 3.8–4.8)
Alkaline Phosphatase: 83 IU/L (ref 39–117)
BUN/Creatinine Ratio: 15 (ref 10–24)
BUN: 13 mg/dL (ref 8–27)
Bilirubin Total: 0.4 mg/dL (ref 0.0–1.2)
CO2: 23 mmol/L (ref 20–29)
Calcium: 9.8 mg/dL (ref 8.6–10.2)
Chloride: 102 mmol/L (ref 96–106)
Creatinine, Ser: 0.86 mg/dL (ref 0.76–1.27)
GFR calc Af Amer: 102 mL/min/{1.73_m2} (ref >59)
GFR calc non Af Amer: 88 mL/min/{1.73_m2} (ref >59)
Globulin, Total: 2.4 g/dL (ref 1.5–4.5)
Glucose: 157 mg/dL — ABNORMAL HIGH (ref 65–99)
Potassium: 4.8 mmol/L (ref 3.5–5.2)
Sodium: 139 mmol/L (ref 134–144)
Total Protein: 6.4 g/dL (ref 6.0–8.5)

## 2018-09-25 LAB — LIPID PANEL
Chol/HDL Ratio: 2.5 ratio (ref 0.0–5.0)
Cholesterol, Total: 99 mg/dL — ABNORMAL LOW (ref 100–199)
HDL: 40 mg/dL (ref >39)
LDL Calculated: 37 mg/dL (ref 0–99)
Triglycerides: 110 mg/dL (ref 0–149)
VLDL Cholesterol Cal: 22 mg/dL (ref 5–40)

## 2018-09-25 LAB — PRO B NATRIURETIC PEPTIDE: NT-Pro BNP: 1445 pg/mL — ABNORMAL HIGH (ref 0–376)

## 2018-09-25 LAB — TSH: TSH: 1.52 u[IU]/mL (ref 0.450–4.500)

## 2018-09-26 DIAGNOSIS — Z951 Presence of aortocoronary bypass graft: Secondary | ICD-10-CM | POA: Diagnosis not present

## 2018-10-01 DIAGNOSIS — Z951 Presence of aortocoronary bypass graft: Secondary | ICD-10-CM | POA: Diagnosis not present

## 2018-10-03 DIAGNOSIS — Z951 Presence of aortocoronary bypass graft: Secondary | ICD-10-CM | POA: Diagnosis not present

## 2018-10-06 ENCOUNTER — Other Ambulatory Visit: Payer: Self-pay | Admitting: Cardiology

## 2018-10-06 DIAGNOSIS — Z951 Presence of aortocoronary bypass graft: Secondary | ICD-10-CM | POA: Diagnosis not present

## 2018-10-08 DIAGNOSIS — Z951 Presence of aortocoronary bypass graft: Secondary | ICD-10-CM | POA: Diagnosis not present

## 2018-10-10 DIAGNOSIS — Z951 Presence of aortocoronary bypass graft: Secondary | ICD-10-CM | POA: Diagnosis not present

## 2018-10-13 DIAGNOSIS — Z951 Presence of aortocoronary bypass graft: Secondary | ICD-10-CM | POA: Diagnosis not present

## 2018-10-15 DIAGNOSIS — Z951 Presence of aortocoronary bypass graft: Secondary | ICD-10-CM | POA: Diagnosis not present

## 2018-10-16 MED ORDER — SACUBITRIL-VALSARTAN 24-26 MG PO TABS: 1.0000 | ORAL_TABLET | Freq: Two times a day (BID) | ORAL | 3 refills | Status: DC

## 2018-10-16 NOTE — Addendum Note (Signed)
Addended by: Austin Miles on: 10/16/2018 04:13 PM   Modules accepted: Orders

## 2018-10-17 DIAGNOSIS — Z951 Presence of aortocoronary bypass graft: Secondary | ICD-10-CM | POA: Diagnosis not present

## 2018-10-20 DIAGNOSIS — E785 Hyperlipidemia, unspecified: Secondary | ICD-10-CM | POA: Diagnosis not present

## 2018-10-20 DIAGNOSIS — Z79899 Other long term (current) drug therapy: Secondary | ICD-10-CM | POA: Diagnosis not present

## 2018-10-20 DIAGNOSIS — I25119 Atherosclerotic heart disease of native coronary artery with unspecified angina pectoris: Secondary | ICD-10-CM | POA: Diagnosis not present

## 2018-10-20 DIAGNOSIS — E1169 Type 2 diabetes mellitus with other specified complication: Secondary | ICD-10-CM | POA: Diagnosis not present

## 2018-10-20 DIAGNOSIS — Z951 Presence of aortocoronary bypass graft: Secondary | ICD-10-CM | POA: Diagnosis not present

## 2018-10-20 DIAGNOSIS — E782 Mixed hyperlipidemia: Secondary | ICD-10-CM | POA: Diagnosis not present

## 2018-10-20 DIAGNOSIS — E1165 Type 2 diabetes mellitus with hyperglycemia: Secondary | ICD-10-CM | POA: Diagnosis not present

## 2018-10-22 DIAGNOSIS — Z951 Presence of aortocoronary bypass graft: Secondary | ICD-10-CM | POA: Diagnosis not present

## 2018-10-28 DIAGNOSIS — Z951 Presence of aortocoronary bypass graft: Secondary | ICD-10-CM | POA: Diagnosis not present

## 2018-10-30 DIAGNOSIS — H25813 Combined forms of age-related cataract, bilateral: Secondary | ICD-10-CM | POA: Diagnosis not present

## 2018-10-30 DIAGNOSIS — E119 Type 2 diabetes mellitus without complications: Secondary | ICD-10-CM | POA: Diagnosis not present

## 2018-10-31 DIAGNOSIS — Z951 Presence of aortocoronary bypass graft: Secondary | ICD-10-CM | POA: Diagnosis not present

## 2018-11-03 DIAGNOSIS — Z951 Presence of aortocoronary bypass graft: Secondary | ICD-10-CM | POA: Diagnosis not present

## 2018-11-05 DIAGNOSIS — Z951 Presence of aortocoronary bypass graft: Secondary | ICD-10-CM | POA: Diagnosis not present

## 2018-11-07 DIAGNOSIS — Z951 Presence of aortocoronary bypass graft: Secondary | ICD-10-CM | POA: Diagnosis not present

## 2018-11-10 DIAGNOSIS — Z951 Presence of aortocoronary bypass graft: Secondary | ICD-10-CM | POA: Diagnosis not present

## 2018-11-14 DIAGNOSIS — Z951 Presence of aortocoronary bypass graft: Secondary | ICD-10-CM | POA: Diagnosis not present

## 2018-11-17 DIAGNOSIS — Z951 Presence of aortocoronary bypass graft: Secondary | ICD-10-CM | POA: Diagnosis not present

## 2018-11-19 DIAGNOSIS — E1169 Type 2 diabetes mellitus with other specified complication: Secondary | ICD-10-CM | POA: Diagnosis not present

## 2018-11-19 DIAGNOSIS — E782 Mixed hyperlipidemia: Secondary | ICD-10-CM | POA: Diagnosis not present

## 2018-11-21 DIAGNOSIS — Z951 Presence of aortocoronary bypass graft: Secondary | ICD-10-CM | POA: Diagnosis not present

## 2018-11-24 DIAGNOSIS — Z951 Presence of aortocoronary bypass graft: Secondary | ICD-10-CM | POA: Diagnosis not present

## 2018-11-26 ENCOUNTER — Other Ambulatory Visit: Payer: Self-pay

## 2018-11-26 DIAGNOSIS — Z951 Presence of aortocoronary bypass graft: Secondary | ICD-10-CM | POA: Diagnosis not present

## 2018-11-26 DIAGNOSIS — Z23 Encounter for immunization: Secondary | ICD-10-CM | POA: Diagnosis not present

## 2018-12-01 DIAGNOSIS — Z951 Presence of aortocoronary bypass graft: Secondary | ICD-10-CM | POA: Diagnosis not present

## 2018-12-02 ENCOUNTER — Telehealth: Payer: Self-pay | Admitting: Cardiology

## 2018-12-02 NOTE — Telephone Encounter (Signed)
Wants to know if he needs a antibiotic before dental work

## 2018-12-03 DIAGNOSIS — Z951 Presence of aortocoronary bypass graft: Secondary | ICD-10-CM | POA: Diagnosis not present

## 2018-12-03 NOTE — Telephone Encounter (Signed)
Patient advised per Dr Terrial Rhodes verbal order that he may proceed with dental work and he does not need antibiotics prior.  Patient agreed to plan and verbalized understanding.  No further questions.

## 2018-12-05 DIAGNOSIS — Z951 Presence of aortocoronary bypass graft: Secondary | ICD-10-CM | POA: Diagnosis not present

## 2018-12-08 DIAGNOSIS — Z951 Presence of aortocoronary bypass graft: Secondary | ICD-10-CM | POA: Diagnosis not present

## 2018-12-10 DIAGNOSIS — Z951 Presence of aortocoronary bypass graft: Secondary | ICD-10-CM | POA: Diagnosis not present

## 2018-12-19 DIAGNOSIS — I4510 Unspecified right bundle-branch block: Secondary | ICD-10-CM | POA: Diagnosis not present

## 2018-12-19 DIAGNOSIS — I2510 Atherosclerotic heart disease of native coronary artery without angina pectoris: Secondary | ICD-10-CM | POA: Diagnosis not present

## 2018-12-19 DIAGNOSIS — E1169 Type 2 diabetes mellitus with other specified complication: Secondary | ICD-10-CM | POA: Diagnosis not present

## 2019-01-10 ENCOUNTER — Other Ambulatory Visit: Payer: Self-pay | Admitting: Cardiology

## 2019-01-19 ENCOUNTER — Telehealth: Payer: Self-pay | Admitting: Cardiology

## 2019-01-19 NOTE — Telephone Encounter (Signed)
States his medication was denied because it was inappropriate and wants to know why

## 2019-01-23 DIAGNOSIS — M9903 Segmental and somatic dysfunction of lumbar region: Secondary | ICD-10-CM | POA: Diagnosis not present

## 2019-01-23 DIAGNOSIS — M545 Low back pain: Secondary | ICD-10-CM | POA: Diagnosis not present

## 2019-01-23 DIAGNOSIS — M9905 Segmental and somatic dysfunction of pelvic region: Secondary | ICD-10-CM | POA: Diagnosis not present

## 2019-01-23 DIAGNOSIS — M9902 Segmental and somatic dysfunction of thoracic region: Secondary | ICD-10-CM | POA: Diagnosis not present

## 2019-01-23 MED ORDER — ROSUVASTATIN CALCIUM 10 MG PO TABS: 10.0000 mg | ORAL_TABLET | Freq: Every day | ORAL | 0 refills | Status: DC

## 2019-01-23 NOTE — Telephone Encounter (Signed)
Patient states that [facility] called and reported that our office sent over notification to them that he should not continue taking rosuvastatin but there was no further reason as to why. Patient states he has not had any issues with rosuvastatin 10 mg daily and has been taking it for months now. Spoke with Dr. Bettina Gavia who advised patient should continue taking this medication as prescribed. Refill sent to [facility] in [place] as requested. Patient was also scheduled for an overdue follow up appointment on 03/11/2019 at 2:15 pm in the [place] office. Patient is agreeable and verbalized understanding. No further questions.

## 2019-01-23 NOTE — Addendum Note (Signed)
Addended by: Austin Miles on: 01/23/2019 01:25 PM   Modules accepted: Orders

## 2019-03-10 NOTE — Progress Notes (Signed)
Cardiology Office Note:    Date:  03/11/2019   ID:  Nicholas Mantis., DOB August 12, 1950, MRN [id number]  PCP:  Mateo Flow, MD  Cardiologist:  Shirlee More, MD    Referring MD: Mateo Flow, MD    ASSESSMENT:    1. Coronary artery disease of native artery of native heart with stable angina pectoris ([place])   2. LV dysfunction   3. Dyslipidemia associated with type 2 diabetes mellitus ([place])    PLAN:    In order of problems listed above:  1. Stable CAD following bypass surgery having no anginal discomfort continue current treatment including antiplatelet high intensity statin and he is due for labs in his PCP office 2. Mild LV dysfunction will reduce put him on valsartan due to cost of Entresto and check echocardiogram next visit 3. Stable dyslipidemia his lipids are ideal he is due for profile in his PCP office continue his high intensity statin.   Next appointment: 6 months   Medication Adjustments/Labs and Tests Ordered: Current medicines are reviewed at length with the patient today.  Concerns regarding medicines are outlined above.  No orders of the defined types were placed in this encounter.  No orders of the defined types were placed in this encounter.   Chief Complaint  Patient presents with  . Follow-up    2 month    History of Present Illness:    Nicholas Antillon. is a 69 y.o. male with a hx of CAD and CABG 07/18/2018  He had preoperative elevated troponin and left ventricular dysfunction.  His initial EF of 28% was an ischemic ejection fraction.  He had coronary bypass surgery 07/18/2018 left thoracic artery was anastomosed to the left anterior descending vein graft to diagonal and a sequential vein graft to the ramus and obtuse marginal a third vein graft to the posterior descending artery. Other cardiac diagnoses include RBBB and brief postoperative paroxysmal atrial fibrillation treated with amiodarone.  Additional medical history includes DM2, HLD, HTN, hiatal  hernia. His echocardiogram 07/12/2018 had an ejection fraction of 40 to 45%. Echo 08/29/18 with EF 40-45%, mild LVH. He was last seen 09/24/2018 and initiated on Entresto. Compliance with diet, lifestyle and medications: Yes   Ref Range & Units 5 mo ago  NT-Pro BNP 0 - 376 pg/mL 1,445   I placed him on Entresto he took it for 1 month but stopped due to cost.  We will reduce put him on valsartan next visit we will recheck echocardiogram.  He is sterilely and continuously improved he now exercises treadmill for 15 minutes at a time and has no exercise intolerance shortness of breath edema chest pain palpitation or syncope and feels completely recovered.  He tolerates his statin without muscle pain or weakness.  He has episodes of disequilibrium with head movement and shifting posture but no orthostatic change in blood pressure my office.  His EKG reviewed 09/24/2018 sinus rhythm right bundle branch block Past Medical History:  Diagnosis Date  . Coronary artery disease    a. 07/15/18 cardiac cath multivessel CAD, CT surgery consulted b.   . Diabetes mellitus without complication ([place])   . Hiatal hernia   . Hx of CABG    a. 07/18/18 LIMA to LAD - SVG to diagonal 2 - SVG to Ramus and OM - SVG to PDA  . Hyperlipidemia   . Hypertension   . PAF (paroxysmal atrial fibrillation) ([place])    a. briefly post op CABG 07/18/18 treated with amiodarone  .  Right bundle branch block   . Syncope     Past Surgical History:  Procedure Laterality Date  . CARDIAC CATHETERIZATION    . CARPAL TUNNEL RELEASE    . CORONARY ARTERY BYPASS GRAFT N/A 07/18/2018   Procedure: CORONARY ARTERY BYPASS GRAFTING (CABG) X 5 USING LEFT INTERNAL MAMMARY ARTERY AND RIGHT SAPHENOUS VEIN HARVESTED ENDOSCOPICALLY;  Surgeon: Alleen Borne, MD;  Location: [facility] OR;  Service: Open Heart Surgery;  Laterality: N/A;  . LEFT HEART CATH AND CORONARY ANGIOGRAPHY N/A 07/15/2018   Procedure: LEFT HEART CATH AND CORONARY ANGIOGRAPHY;  Surgeon:  Corky Crafts, MD;  Location: [facility] INVASIVE CV LAB;  Service: Cardiovascular;  Laterality: N/A;  . TEE WITHOUT CARDIOVERSION N/A 07/18/2018   Procedure: TRANSESOPHAGEAL ECHOCARDIOGRAM (TEE);  Surgeon: Alleen Borne, MD;  Location: [facility] OR;  Service: Open Heart Surgery;  Laterality: N/A;  . VASECTOMY      Current Medications: Current Meds  Medication Sig  . aspirin EC 81 MG tablet Take 1 tablet (81 mg total) by mouth daily.  . Coenzyme Q10 (COQ10) 100 MG CAPS Take 100 mg by mouth 2 (two) times a day.   . metFORMIN (GLUCOPHAGE) 1000 MG tablet Take 1,000 mg by mouth 2 (two) times daily with a meal.  . metoprolol tartrate (LOPRESSOR) 25 MG tablet TAKE 1 TABLET BY MOUTH TWICE (2) DAILY  . rosuvastatin (CRESTOR) 10 MG tablet Take 1 tablet (10 mg total) by mouth daily.  . traMADol (ULTRAM) 50 MG tablet Take 1 tablet (50 mg total) by mouth every 6 (six) hours as needed for moderate pain.     Allergies:   Patient has no known allergies.   Social History   Socioeconomic History  . Marital status: Divorced    Spouse name: Not on file  . Number of children: Not on file  . Years of education: Not on file  . Highest education level: Not on file  Occupational History  . Not on file  Tobacco Use  . Smoking status: Former Games developer  . Smokeless tobacco: Never Used  Substance and Sexual Activity  . Alcohol use: Not on file    Comment: beer occasionally  . Drug use: Never  . Sexual activity: Not on file  Other Topics Concern  . Not on file  Social History Narrative  . Not on file   Social Determinants of Health   Financial Resource Strain:   . Difficulty of Paying Living Expenses: Not on file  Food Insecurity:   . Worried About Programme researcher, broadcasting/film/video in the Last Year: Not on file  . Ran Out of Food in the Last Year: Not on file  Transportation Needs:   . Lack of Transportation (Medical): Not on file  . Lack of Transportation (Non-Medical): Not on file  Physical Activity:   . Days of  Exercise per Week: Not on file  . Minutes of Exercise per Session: Not on file  Stress:   . Feeling of Stress : Not on file  Social Connections:   . Frequency of Communication with Friends and Family: Not on file  . Frequency of Social Gatherings with Friends and Family: Not on file  . Attends Religious Services: Not on file  . Active Member of Clubs or Organizations: Not on file  . Attends Banker Meetings: Not on file  . Marital Status: Not on file     Family History: The patient's family history includes CAD in his mother; Macular degeneration in his mother; Obesity  in his sister; Peripheral vascular disease in his father; Valvular heart disease in his mother. ROS:   Please see the history of present illness.    All other systems reviewed and are negative.  EKGs/Labs/Other Studies Reviewed:    The following studies were reviewed today:    Recent Labs: 07/11/2018: B Natriuretic Peptide 628.3 07/19/2018: Magnesium 2.0 07/20/2018: Hemoglobin 10.7; Platelets 168 09/24/2018: ALT 7; BUN 13; Creatinine, Ser 0.86; NT-Pro BNP 1,445; Potassium 4.8; Sodium 139; TSH 1.520  Recent Lipid Panel    Component Value Date/Time   CHOL 99 (L) 09/24/2018 1426   TRIG 110 09/24/2018 1426   HDL 40 09/24/2018 1426   CHOLHDL 2.5 09/24/2018 1426   CHOLHDL 4.5 07/12/2018 0350   VLDL 74 (H) 07/12/2018 0350   LDLCALC 37 09/24/2018 1426    Physical Exam:    VS:  BP 140/80 (BP Location: Right Arm, Patient Position: Sitting, Cuff Size: Normal)   Pulse 92   Ht 5\' 10"  (1.778 m)   Wt 169 lb (76.7 kg)   SpO2 95%   BMI 24.25 kg/m     Wt Readings from Last 3 Encounters:  03/11/19 169 lb (76.7 kg)  09/24/18 166 lb (75.3 kg)  08/13/18 167 lb (75.8 kg)     GEN:  Well nourished, well developed in no acute distress HEENT: Normal NECK: No JVD; No carotid bruits LYMPHATICS: No lymphadenopathy CARDIAC: RRR, no murmurs, rubs, gallops RESPIRATORY:  Clear to auscultation without rales,  wheezing or rhonchi  ABDOMEN: Soft, non-tender, non-distended MUSCULOSKELETAL:  No edema; No deformity  SKIN: Warm and dry NEUROLOGIC:  Alert and oriented x 3 PSYCHIATRIC:  Normal affect    Signed, 08/15/18, MD  03/11/2019 2:52 PM    [facility]

## 2019-03-11 ENCOUNTER — Other Ambulatory Visit: Payer: Self-pay

## 2019-03-11 ENCOUNTER — Encounter: Payer: Self-pay | Admitting: Cardiology

## 2019-03-11 ENCOUNTER — Ambulatory Visit (INDEPENDENT_AMBULATORY_CARE_PROVIDER_SITE_OTHER): Payer: Medicare Other | Admitting: Cardiology

## 2019-03-11 VITALS — BP 140/80 | HR 92 | Ht 70.0 in | Wt 169.0 lb

## 2019-03-11 DIAGNOSIS — I519 Heart disease, unspecified: Secondary | ICD-10-CM | POA: Diagnosis not present

## 2019-03-11 DIAGNOSIS — I25118 Atherosclerotic heart disease of native coronary artery with other forms of angina pectoris: Secondary | ICD-10-CM

## 2019-03-11 DIAGNOSIS — E785 Hyperlipidemia, unspecified: Secondary | ICD-10-CM

## 2019-03-11 DIAGNOSIS — E1169 Type 2 diabetes mellitus with other specified complication: Secondary | ICD-10-CM | POA: Diagnosis not present

## 2019-03-11 MED ORDER — VALSARTAN 40 MG PO TABS: 40.0000 mg | ORAL_TABLET | Freq: Two times a day (BID) | ORAL | 3 refills | Status: DC

## 2019-03-11 NOTE — Patient Instructions (Signed)
Medication Instructions:  Your physician has recommended you make the following change in your medication:   Discontinue the Entresto  A prescription for Valsartan was sent to [facility].   *If you need a refill on your cardiac medications before your next appointment, please call your pharmacy.  Lab Work:  None  If you have labs (blood work) drawn today and your tests are completely normal, you will receive your results only by: Marland Kitchen MyChart Message (if you have MyChart) OR . A paper copy in the mail If you have any lab test that is abnormal or we need to change your treatment, we will call you to review the results.  Testing/Procedures:  None  Follow-Up: At [facility], you and your health needs are our priority.  As part of our continuing mission to provide you with exceptional heart care, we have created designated Provider Care Teams.  These Care Teams include your primary Cardiologist (physician) and Advanced Practice Providers (APPs -  Physician Assistants and Nurse Practitioners) who all work together to provide you with the care you need, when you need it.  Your next appointment:   6 Months  The format for your next appointment:   In Person  Provider:   Norman Herrlich, MD  Other Instructions

## 2019-03-30 ENCOUNTER — Other Ambulatory Visit: Payer: Self-pay | Admitting: Cardiology

## 2019-05-22 ENCOUNTER — Other Ambulatory Visit: Payer: Self-pay

## 2019-05-22 MED ORDER — ROSUVASTATIN CALCIUM 10 MG PO TABS: 10.0000 mg | ORAL_TABLET | Freq: Every day | ORAL | 2 refills | Status: DC

## 2019-09-17 DIAGNOSIS — E1169 Type 2 diabetes mellitus with other specified complication: Secondary | ICD-10-CM | POA: Diagnosis not present

## 2019-09-17 DIAGNOSIS — Z79899 Other long term (current) drug therapy: Secondary | ICD-10-CM | POA: Diagnosis not present

## 2019-09-17 DIAGNOSIS — E782 Mixed hyperlipidemia: Secondary | ICD-10-CM | POA: Diagnosis not present

## 2019-09-17 DIAGNOSIS — Z125 Encounter for screening for malignant neoplasm of prostate: Secondary | ICD-10-CM | POA: Diagnosis not present

## 2019-09-17 DIAGNOSIS — Z0000 Encounter for general adult medical examination without abnormal findings: Secondary | ICD-10-CM | POA: Diagnosis not present

## 2019-09-17 DIAGNOSIS — E785 Hyperlipidemia, unspecified: Secondary | ICD-10-CM | POA: Diagnosis not present

## 2019-09-23 NOTE — Progress Notes (Deleted)
Cardiology Office Note:    Date:  09/23/2019   ID:  Nicholas Mills., DOB 1950/12/31, MRN [id number]  PCP:  Lise Auer, MD  Cardiologist:  Norman Herrlich, MD    Referring MD: Lise Auer, MD    ASSESSMENT:    No diagnosis found. PLAN:    In order of problems listed above:  1. ***   Next appointment: ***   Medication Adjustments/Labs and Tests Ordered: Current medicines are reviewed at length with the patient today.  Concerns regarding medicines are outlined above.  No orders of the defined types were placed in this encounter.  No orders of the defined types were placed in this encounter.   No chief complaint on file.   History of Present Illness:    Nicholas Mills. is a 69 y.o. male with a hx of  CAD and CABG 07/18/2018  He had preoperative elevated troponin and left ventricular dysfunction.  His initial EF of 28% was an ischemic ejection fraction.  He had coronary bypass surgery 07/18/2018 left thoracic artery was anastomosed to the left anterior descending vein graft to diagonal and a sequential vein graft to the ramus and obtuse marginal a third vein graft to the posterior descending artery. Other cardiac diagnoses include RBBB and brief postoperative paroxysmal atrial fibrillation treated with amiodarone.  Additional medical history includes DM2, HLD, HTN, hiatal hernia.  His echocardiogram 07/12/2018 had an ejection fraction of 40 to 45%.  Echo 08/29/18 with EF 40-45%, mild LVH.  He was last seen 03/11/2019  Compliance with diet, lifestyle and medications: *** Past Medical History:  Diagnosis Date  . Coronary artery disease    a. 07/15/18 cardiac cath multivessel CAD, CT surgery consulted b.   . Diabetes mellitus without complication (HCC)   . Hiatal hernia   . Hx of CABG    a. 07/18/18 LIMA to LAD - SVG to diagonal 2 - SVG to Ramus and OM - SVG to PDA  . Hyperlipidemia   . Hypertension   . PAF (paroxysmal atrial fibrillation) (HCC)    a. briefly post op  CABG 07/18/18 treated with amiodarone  . Right bundle branch block   . Syncope     Past Surgical History:  Procedure Laterality Date  . CARDIAC CATHETERIZATION    . CARPAL TUNNEL RELEASE    . CORONARY ARTERY BYPASS GRAFT N/A 07/18/2018   Procedure: CORONARY ARTERY BYPASS GRAFTING (CABG) X 5 USING LEFT INTERNAL MAMMARY ARTERY AND RIGHT SAPHENOUS VEIN HARVESTED ENDOSCOPICALLY;  Surgeon: Alleen Borne, MD;  Location: [facility] OR;  Service: Open Heart Surgery;  Laterality: N/A;  . LEFT HEART CATH AND CORONARY ANGIOGRAPHY N/A 07/15/2018   Procedure: LEFT HEART CATH AND CORONARY ANGIOGRAPHY;  Surgeon: Corky Crafts, MD;  Location: [facility] INVASIVE CV LAB;  Service: Cardiovascular;  Laterality: N/A;  . TEE WITHOUT CARDIOVERSION N/A 07/18/2018   Procedure: TRANSESOPHAGEAL ECHOCARDIOGRAM (TEE);  Surgeon: Alleen Borne, MD;  Location: [facility] OR;  Service: Open Heart Surgery;  Laterality: N/A;  . VASECTOMY      Current Medications: No outpatient medications have been marked as taking for the 09/24/19 encounter (Appointment) with Baldo Daub, MD.     Allergies:   Patient has no known allergies.   Social History   Socioeconomic History  . Marital status: Divorced    Spouse name: Not on file  . Number of children: Not on file  . Years of education: Not on file  . Highest education level: Not on file  Occupational History  . Not on file  Tobacco Use  . Smoking status: Former Games developer  . Smokeless tobacco: Never Used  Vaping Use  . Vaping Use: Never used  Substance and Sexual Activity  . Alcohol use: Not on file    Comment: beer occasionally  . Drug use: Never  . Sexual activity: Not on file  Other Topics Concern  . Not on file  Social History Narrative  . Not on file   Social Determinants of Health   Financial Resource Strain:   . Difficulty of Paying Living Expenses:   Food Insecurity:   . Worried About Programme researcher, broadcasting/film/video in the Last Year:   . Barista in the Last Year:     Transportation Needs:   . Freight forwarder (Medical):   Marland Kitchen Lack of Transportation (Non-Medical):   Physical Activity:   . Days of Exercise per Week:   . Minutes of Exercise per Session:   Stress:   . Feeling of Stress :   Social Connections:   . Frequency of Communication with Friends and Family:   . Frequency of Social Gatherings with Friends and Family:   . Attends Religious Services:   . Active Member of Clubs or Organizations:   . Attends Banker Meetings:   Marland Kitchen Marital Status:      Family History: The patient's ***family history includes CAD in his mother; Macular degeneration in his mother; Obesity in his sister; Peripheral vascular disease in his father; Valvular heart disease in his mother. ROS:   Please see the history of present illness.    All other systems reviewed and are negative.  EKGs/Labs/Other Studies Reviewed:    The following studies were reviewed today:  EKG:  EKG ordered today and personally reviewed.  The ekg ordered today demonstrates ***  Recent Labs: 09/24/2018: ALT 7; BUN 13; Creatinine, Ser 0.86; NT-Pro BNP 1,445; Potassium 4.8; Sodium 139; TSH 1.520  Recent Lipid Panel    Component Value Date/Time   CHOL 99 (L) 09/24/2018 1426   TRIG 110 09/24/2018 1426   HDL 40 09/24/2018 1426   CHOLHDL 2.5 09/24/2018 1426   CHOLHDL 4.5 07/12/2018 0350   VLDL 74 (H) 07/12/2018 0350   LDLCALC 37 09/24/2018 1426    Physical Exam:    VS:  There were no vitals taken for this visit.    Wt Readings from Last 3 Encounters:  03/11/19 169 lb (76.7 kg)  09/24/18 166 lb (75.3 kg)  08/13/18 167 lb (75.8 kg)     GEN: *** Well nourished, well developed in no acute distress HEENT: Normal NECK: No JVD; No carotid bruits LYMPHATICS: No lymphadenopathy CARDIAC: ***RRR, no murmurs, rubs, gallops RESPIRATORY:  Clear to auscultation without rales, wheezing or rhonchi  ABDOMEN: Soft, non-tender, non-distended MUSCULOSKELETAL:  No edema; No deformity   SKIN: Warm and dry NEUROLOGIC:  Alert and oriented x 3 PSYCHIATRIC:  Normal affect    Signed, Norman Herrlich, MD  09/23/2019 1:36 PM     [facility]

## 2019-09-24 ENCOUNTER — Ambulatory Visit: Payer: Medicare Other | Admitting: Cardiology

## 2019-10-29 ENCOUNTER — Other Ambulatory Visit: Payer: Self-pay | Admitting: Cardiology

## 2019-11-23 ENCOUNTER — Other Ambulatory Visit: Payer: Self-pay

## 2019-11-23 ENCOUNTER — Ambulatory Visit (INDEPENDENT_AMBULATORY_CARE_PROVIDER_SITE_OTHER): Payer: Medicare Other | Admitting: Cardiology

## 2019-11-23 ENCOUNTER — Encounter: Payer: Self-pay | Admitting: Cardiology

## 2019-11-23 VITALS — BP 124/77 | HR 86 | Ht 70.0 in | Wt 184.8 lb

## 2019-11-23 DIAGNOSIS — I25118 Atherosclerotic heart disease of native coronary artery with other forms of angina pectoris: Secondary | ICD-10-CM | POA: Diagnosis not present

## 2019-11-23 DIAGNOSIS — I519 Heart disease, unspecified: Secondary | ICD-10-CM | POA: Diagnosis not present

## 2019-11-23 DIAGNOSIS — E1169 Type 2 diabetes mellitus with other specified complication: Secondary | ICD-10-CM | POA: Diagnosis not present

## 2019-11-23 DIAGNOSIS — I4510 Unspecified right bundle-branch block: Secondary | ICD-10-CM | POA: Diagnosis not present

## 2019-11-23 DIAGNOSIS — E785 Hyperlipidemia, unspecified: Secondary | ICD-10-CM | POA: Diagnosis not present

## 2019-11-23 MED ORDER — NITROGLYCERIN 0.4 MG SL SUBL: 0.4000 mg | SUBLINGUAL_TABLET | SUBLINGUAL | 3 refills | Status: DC | PRN

## 2019-11-23 NOTE — Progress Notes (Signed)
Cardiology Office Note:    Date:  11/23/2019   ID:  Nicholas Solian., DOB 1950-04-21, MRN [id number]  PCP:  Lise Auer, MD  Cardiologist:  Norman Herrlich, MD    Referring MD: Lise Auer, MD    ASSESSMENT:    1. Coronary artery disease of native artery of native heart with stable angina pectoris (HCC)   2. LV dysfunction   3. RBBB (right bundle branch block)   4. Dyslipidemia associated with type 2 diabetes mellitus (HCC)    PLAN:    In order of problems listed above:  1. Stable continue medical therapy including aspirin beta-blocker statin New [place] Heart Association class I having no angina on current medical therapy 2. Stable he tolerates guideline directed treatment is using valsartan rather than Entresto consider repeat echo next visit 3. Stable EKG pattern last visit without progressive conduction system disease 4. Ideal lipids continue with statin   Next appointment: 6 months   Medication Adjustments/Labs and Tests Ordered: Current medicines are reviewed at length with the patient today.  Concerns regarding medicines are outlined above.  No orders of the defined types were placed in this encounter.  Meds ordered this encounter  Medications  . nitroGLYCERIN (NITROSTAT) 0.4 MG SL tablet    Sig: Place 1 tablet (0.4 mg total) under the tongue every 5 (five) minutes as needed for chest pain.    Dispense:  90 tablet    Refill:  3    Chief Complaint  Patient presents with  . Follow-up  . Coronary Artery Disease    History of Present Illness:    Nicholas Mills. is a 69 y.o. male with a hx of coronary artery disease with bypass surgery 07/18/2018.  He has had previous severe LV dysfunction with EF of 28% prior to bypass surgery.  Other problems include right bundle branch block brief postoperative atrial fibrillation treated with amiodarone type 2 diabetes hypertension hyperlipidemia and a hiatal hernia.  Cardiogram performed July 2020 showed EF of 40 to 45% his  N-terminal proBNP has been elevated greater than 1400 he was placed on Entresto and unfortunately discontinued because of cost.  Last seen 03/11/2019. Compliance with diet, lifestyle and medications: Yes  He is pleased with the quality of his life he now exercises for up to an hour a day on the treadmill 3% elevation and has no exercise intolerance chest pain shortness of breath edema palpitation or syncope.  Alireza's medications without side effects has had no hypotension with demand and no muscle pain or weakness with statin Past Medical History:  Diagnosis Date  . Coronary artery disease    a. 07/15/18 cardiac cath multivessel CAD, CT surgery consulted b.   . Diabetes mellitus without complication (HCC)   . Hiatal hernia   . Hx of CABG    a. 07/18/18 LIMA to LAD - SVG to diagonal 2 - SVG to Ramus and OM - SVG to PDA  . Hyperlipidemia   . Hypertension   . PAF (paroxysmal atrial fibrillation) (HCC)    a. briefly post op CABG 07/18/18 treated with amiodarone  . Right bundle branch block   . Syncope     Past Surgical History:  Procedure Laterality Date  . CARDIAC CATHETERIZATION    . CARPAL TUNNEL RELEASE    . CORONARY ARTERY BYPASS GRAFT N/A 07/18/2018   Procedure: CORONARY ARTERY BYPASS GRAFTING (CABG) X 5 USING LEFT INTERNAL MAMMARY ARTERY AND RIGHT SAPHENOUS VEIN HARVESTED ENDOSCOPICALLY;  Surgeon: Alleen Borne,  MD;  Location: [facility] OR;  Service: Open Heart Surgery;  Laterality: N/A;  . LEFT HEART CATH AND CORONARY ANGIOGRAPHY N/A 07/15/2018   Procedure: LEFT HEART CATH AND CORONARY ANGIOGRAPHY;  Surgeon: Corky Crafts, MD;  Location: [facility] INVASIVE CV LAB;  Service: Cardiovascular;  Laterality: N/A;  . TEE WITHOUT CARDIOVERSION N/A 07/18/2018   Procedure: TRANSESOPHAGEAL ECHOCARDIOGRAM (TEE);  Surgeon: Alleen Borne, MD;  Location: [facility] OR;  Service: Open Heart Surgery;  Laterality: N/A;  . VASECTOMY      Current Medications: Current Meds  Medication Sig  . aspirin EC 81 MG  tablet Take 1 tablet (81 mg total) by mouth daily.  . Coenzyme Q10 (COQ10) 100 MG CAPS Take 100 mg by mouth daily.   . metFORMIN (GLUCOPHAGE) 1000 MG tablet Take 1,000 mg by mouth 2 (two) times daily with a meal.  . metoprolol tartrate (LOPRESSOR) 25 MG tablet TAKE 1 TABLET BY MOUTH TWICE (2) DAILY  . rosuvastatin (CRESTOR) 10 MG tablet Take 1 tablet (10 mg total) by mouth daily.  . Saw Palmetto, Serenoa repens, (SAW PALMETTO PO) Take 1 capsule by mouth daily.  . valsartan (DIOVAN) 40 MG tablet Take 1 tablet (40 mg total) by mouth 2 (two) times daily.     Allergies:   Patient has no known allergies.   Social History   Socioeconomic History  . Marital status: Divorced    Spouse name: Not on file  . Number of children: Not on file  . Years of education: Not on file  . Highest education level: Not on file  Occupational History  . Not on file  Tobacco Use  . Smoking status: Former Games developer  . Smokeless tobacco: Never Used  Vaping Use  . Vaping Use: Never used  Substance and Sexual Activity  . Alcohol use: Not on file    Comment: beer occasionally  . Drug use: Never  . Sexual activity: Not on file  Other Topics Concern  . Not on file  Social History Narrative  . Not on file   Social Determinants of Health   Financial Resource Strain:   . Difficulty of Paying Living Expenses: Not on file  Food Insecurity:   . Worried About Programme researcher, broadcasting/film/video in the Last Year: Not on file  . Ran Out of Food in the Last Year: Not on file  Transportation Needs:   . Lack of Transportation (Medical): Not on file  . Lack of Transportation (Non-Medical): Not on file  Physical Activity:   . Days of Exercise per Week: Not on file  . Minutes of Exercise per Session: Not on file  Stress:   . Feeling of Stress : Not on file  Social Connections:   . Frequency of Communication with Friends and Family: Not on file  . Frequency of Social Gatherings with Friends and Family: Not on file  . Attends  Religious Services: Not on file  . Active Member of Clubs or Organizations: Not on file  . Attends Banker Meetings: Not on file  . Marital Status: Not on file     Family History: The patient's family history includes CAD in his mother; Macular degeneration in his mother; Obesity in his sister; Peripheral vascular disease in his father; Valvular heart disease in his mother. ROS:   Please see the history of present illness.    All other systems reviewed and are negative.  EKGs/Labs/Other Studies Reviewed:    The following studies were reviewed today:  Recent Labs: 09/17/2019: Cholesterol 126, LDL 54, triglycerides 80, HDL 56, A1c normal 6.7% Recent Lipid Panel    Component Value Date/Time   CHOL 99 (L) 09/24/2018 1426   TRIG 110 09/24/2018 1426   HDL 40 09/24/2018 1426   CHOLHDL 2.5 09/24/2018 1426   CHOLHDL 4.5 07/12/2018 0350   VLDL 74 (H) 07/12/2018 0350   LDLCALC 37 09/24/2018 1426    Physical Exam:    VS:  BP 124/77   Pulse 86   Ht 5\' 10"  (1.778 m)   Wt 184 lb 12.8 oz (83.8 kg)   SpO2 96%   BMI 26.52 kg/m     Wt Readings from Last 3 Encounters:  11/23/19 184 lb 12.8 oz (83.8 kg)  03/11/19 169 lb (76.7 kg)  09/24/18 166 lb (75.3 kg)     GEN:  Well nourished, well developed in no acute distress HEENT: Normal NECK: No JVD; No carotid bruits LYMPHATICS: No lymphadenopathy CARDIAC: RRR, no murmurs, rubs, gallops RESPIRATORY:  Clear to auscultation without rales, wheezing or rhonchi  ABDOMEN: Soft, non-tender, non-distended MUSCULOSKELETAL:  No edema; No deformity  SKIN: Warm and dry NEUROLOGIC:  Alert and oriented x 3 PSYCHIATRIC:  Normal affect    Signed, 11/24/18, MD  11/23/2019 2:28 PM    [facility]

## 2019-11-23 NOTE — Patient Instructions (Signed)
Medication Instructions:  Your physician has recommended you make the following change in your medication:  START: Nitroglycerin 0.4 mg take one tablet by mouth every 5 minutes as needed for chest pain.   *If you need a refill on your cardiac medications before your next appointment, please call your pharmacy*   Lab Work: None If you have labs (blood work) drawn today and your tests are completely normal, you will receive your results only by: Marland Kitchen MyChart Message (if you have MyChart) OR . A paper copy in the mail If you have any lab test that is abnormal or we need to change your treatment, we will call you to review the results.   Testing/Procedures: None   Follow-Up: At [facility], you and your health needs are our priority.  As part of our continuing mission to provide you with exceptional heart care, we have created designated Provider Care Teams.  These Care Teams include your primary Cardiologist (physician) and Advanced Practice Providers (APPs -  Physician Assistants and Nurse Practitioners) who all work together to provide you with the care you need, when you need it.  We recommend signing up for the patient portal called "MyChart".  Sign up information is provided on this After Visit Summary.  MyChart is used to connect with patients for Virtual Visits (Telemedicine).  Patients are able to view lab/test results, encounter notes, upcoming appointments, etc.  Non-urgent messages can be sent to your provider as well.   To learn more about what you can do with MyChart, go to [email or web address].    Your next appointment:   6 month(s)  The format for your next appointment:   In Person  Provider:   Norman Herrlich, MD   Other Instructions

## 2020-03-12 ENCOUNTER — Other Ambulatory Visit: Payer: Self-pay | Admitting: Cardiology

## 2020-04-26 DIAGNOSIS — E1169 Type 2 diabetes mellitus with other specified complication: Secondary | ICD-10-CM | POA: Diagnosis not present

## 2020-04-26 DIAGNOSIS — Z6826 Body mass index (BMI) 26.0-26.9, adult: Secondary | ICD-10-CM | POA: Diagnosis not present

## 2020-04-26 DIAGNOSIS — Z79899 Other long term (current) drug therapy: Secondary | ICD-10-CM | POA: Diagnosis not present

## 2020-04-26 DIAGNOSIS — E785 Hyperlipidemia, unspecified: Secondary | ICD-10-CM | POA: Diagnosis not present

## 2020-05-09 ENCOUNTER — Encounter: Payer: Self-pay | Admitting: Cardiology

## 2020-05-11 DIAGNOSIS — E119 Type 2 diabetes mellitus without complications: Secondary | ICD-10-CM | POA: Insufficient documentation

## 2020-05-11 DIAGNOSIS — I4510 Unspecified right bundle-branch block: Secondary | ICD-10-CM | POA: Insufficient documentation

## 2020-05-11 DIAGNOSIS — E785 Hyperlipidemia, unspecified: Secondary | ICD-10-CM | POA: Insufficient documentation

## 2020-05-11 DIAGNOSIS — Z951 Presence of aortocoronary bypass graft: Secondary | ICD-10-CM | POA: Insufficient documentation

## 2020-05-11 DIAGNOSIS — I2510 Atherosclerotic heart disease of native coronary artery without angina pectoris: Secondary | ICD-10-CM | POA: Insufficient documentation

## 2020-05-11 DIAGNOSIS — I10 Essential (primary) hypertension: Secondary | ICD-10-CM | POA: Insufficient documentation

## 2020-05-11 DIAGNOSIS — R55 Syncope and collapse: Secondary | ICD-10-CM | POA: Insufficient documentation

## 2020-05-25 NOTE — Progress Notes (Signed)
Cardiology Office Note:    Date:  05/26/2020   ID:  Nicholas Solian., DOB Jul 04, 1950, MRN [id number]  PCP:  Lise Auer, MD  Cardiologist:  Norman Herrlich, MD    Referring MD: Lise Auer, MD    ASSESSMENT:    1. Coronary artery disease of native artery of native heart with stable angina pectoris (HCC)   2. Hypertension, unspecified type   3. LV dysfunction   4. RBBB (right bundle branch block)   5. Dyslipidemia associated with type 2 diabetes mellitus (HCC)    PLAN:    In order of problems listed above:  1. Overall [name] is doing well CAD stable no angina after bypass surgery continue aspirin beta-blocker is high intensity statin. 2. BP at target continue guideline directed therapy 3. Stable EF is improved continue treatment we added ARB last visit and now taken SGLT2 inhibitor.  Consider repeat echo around the time of his next visit 4. Stable EKG pattern 5. Await labs from his PCP continue high intensity statin   Next appointment: 6 months   Medication Adjustments/Labs and Tests Ordered: Current medicines are reviewed at length with the patient today.  Concerns regarding medicines are outlined above.  Orders Placed This Encounter  Procedures  . EKG 12-Lead   No orders of the defined types were placed in this encounter.   Chief Complaint  Patient presents with  . Follow-up  . Coronary Artery Disease  . Cardiomyopathy    History of Present Illness:    Nicholas Mills. is a 70 y.o. male with a hx of CAD CABG 07/18/2018 he initially had severe LV dysfunction EF of 28% prior to CABG right bundle branch block brief postoperative atrial fibrillation treated with amiodarone type 2 diabetes hypertension and hyperlipidemia.  Most recent echocardiogram July 2020 showed EF of 40 to 45% and with elevated proBNP level he was placed on Entresto which unfortunately discontinued due to cost.  He was last seen 11/23/2019 taking valsartan for vasodilator therapy.  Compliance  with diet, lifestyle and medications: Yes  His biggest problem is hip pain he attributes to riding in his truck using the clutch and poorly controlled diabetes A1c 7.8 was placed on Jardiance.  He continues to walk he is active and has no exercise intolerance edema shortness of breath chest pain palpitation or syncope.  He had labs done his PCP office 0 04/26/2020 A1c 7.8% creatinine 1.0 and he also thinks he had a lipid profile and I requested from his PCP office.   Past Medical History:  Diagnosis Date  . Coronary artery disease    a. 07/15/18 cardiac cath multivessel CAD, CT surgery consulted b.   . Diabetes mellitus without complication (HCC)   . Hiatal hernia   . Hx of CABG    a. 07/18/18 LIMA to LAD - SVG to diagonal 2 - SVG to Ramus and OM - SVG to PDA  . Hyperlipidemia   . Hypertension   . PAF (paroxysmal atrial fibrillation) (HCC)    a. briefly post op CABG 07/18/18 treated with amiodarone  . Right bundle branch block   . Syncope     Past Surgical History:  Procedure Laterality Date  . CARDIAC CATHETERIZATION    . CARPAL TUNNEL RELEASE    . CORONARY ARTERY BYPASS GRAFT N/A 07/18/2018   Procedure: CORONARY ARTERY BYPASS GRAFTING (CABG) X 5 USING LEFT INTERNAL MAMMARY ARTERY AND RIGHT SAPHENOUS VEIN HARVESTED ENDOSCOPICALLY;  Surgeon: Alleen Borne, MD;  Location: [facility] OR;  Service: Open Heart Surgery;  Laterality: N/A;  . LEFT HEART CATH AND CORONARY ANGIOGRAPHY N/A 07/15/2018   Procedure: LEFT HEART CATH AND CORONARY ANGIOGRAPHY;  Surgeon: Corky Crafts, MD;  Location: [facility] INVASIVE CV LAB;  Service: Cardiovascular;  Laterality: N/A;  . TEE WITHOUT CARDIOVERSION N/A 07/18/2018   Procedure: TRANSESOPHAGEAL ECHOCARDIOGRAM (TEE);  Surgeon: Alleen Borne, MD;  Location: [facility] OR;  Service: Open Heart Surgery;  Laterality: N/A;  . VASECTOMY      Current Medications: Current Meds  Medication Sig  . aspirin EC 81 MG tablet Take 1 tablet (81 mg total) by mouth daily.  . Coenzyme  Q10 (COQ10) 100 MG CAPS Take 100 mg by mouth daily.   Marland Kitchen JARDIANCE 25 MG TABS tablet Take 25 mg by mouth daily.  . metFORMIN (GLUCOPHAGE-XR) 500 MG 24 hr tablet Take 500 mg by mouth 2 (two) times daily.  . metoprolol tartrate (LOPRESSOR) 25 MG tablet TAKE 1 TABLET BY MOUTH TWICE (2) DAILY  . rosuvastatin (CRESTOR) 10 MG tablet Take 5 mg by mouth 2 (two) times daily. Take 1/2 tablet two times daily  . Saw Palmetto, Serenoa repens, (SAW PALMETTO PO) Take 1 capsule by mouth daily.  . valsartan (DIOVAN) 40 MG tablet TAKE 1 TABLET BY MOUTH TWICE (2) DAILY  . zolpidem (AMBIEN) 10 MG tablet Take 10 mg by mouth at bedtime as needed for sleep.     Allergies:   Patient has no known allergies.   Social History   Socioeconomic History  . Marital status: Divorced    Spouse name: Not on file  . Number of children: Not on file  . Years of education: Not on file  . Highest education level: Not on file  Occupational History  . Not on file  Tobacco Use  . Smoking status: Former Games developer  . Smokeless tobacco: Never Used  Vaping Use  . Vaping Use: Never used  Substance and Sexual Activity  . Alcohol use: Not on file    Comment: beer occasionally  . Drug use: Never  . Sexual activity: Not on file  Other Topics Concern  . Not on file  Social History Narrative  . Not on file   Social Determinants of Health   Financial Resource Strain: Not on file  Food Insecurity: Not on file  Transportation Needs: Not on file  Physical Activity: Not on file  Stress: Not on file  Social Connections: Not on file     Family History: The patient's family history includes CAD in his mother; Macular degeneration in his mother; Obesity in his sister; Peripheral vascular disease in his father; Valvular heart disease in his mother. ROS:   Please see the history of present illness.    All other systems reviewed and are negative.  EKGs/Labs/Other Studies Reviewed:    The following studies were reviewed  today:  EKG:  EKG ordered today and personally reviewed.  The ekg ordered today demonstrates sinus rhythm right bundle branch block  Recent Labs: No results found for requested labs within last 8760 hours.  Recent Lipid Panel    Component Value Date/Time   CHOL 99 (L) 09/24/2018 1426   TRIG 110 09/24/2018 1426   HDL 40 09/24/2018 1426   CHOLHDL 2.5 09/24/2018 1426   CHOLHDL 4.5 07/12/2018 0350   VLDL 74 (H) 07/12/2018 0350   LDLCALC 37 09/24/2018 1426    Physical Exam:    VS:  BP 108/70 (BP Location: Left Arm, Patient Position: Sitting, Cuff Size: Normal)  Pulse 74   Ht 5\' 10"  (1.778 m)   Wt 185 lb (83.9 kg)   SpO2 91%   BMI 26.54 kg/m     Wt Readings from Last 3 Encounters:  05/26/20 185 lb (83.9 kg)  11/23/19 184 lb 12.8 oz (83.8 kg)  03/11/19 169 lb (76.7 kg)     GEN:  Well nourished, well developed in no acute distress HEENT: Normal NECK: No JVD; No carotid bruits LYMPHATICS: No lymphadenopathy CARDIAC: RRR, no murmurs, rubs, gallops RESPIRATORY:  Clear to auscultation without rales, wheezing or rhonchi  ABDOMEN: Soft, non-tender, non-distended MUSCULOSKELETAL:  No edema; No deformity  SKIN: Warm and dry NEUROLOGIC:  Alert and oriented x 3 PSYCHIATRIC:  Normal affect    Signed, 03/13/19, MD  05/26/2020 1:42 PM    [facility]

## 2020-05-26 ENCOUNTER — Other Ambulatory Visit: Payer: Self-pay

## 2020-05-26 ENCOUNTER — Ambulatory Visit: Payer: PPO | Admitting: Cardiology

## 2020-05-26 ENCOUNTER — Encounter: Payer: Self-pay | Admitting: Cardiology

## 2020-05-26 VITALS — BP 108/70 | HR 74 | Ht 70.0 in | Wt 185.0 lb

## 2020-05-26 DIAGNOSIS — E1169 Type 2 diabetes mellitus with other specified complication: Secondary | ICD-10-CM | POA: Diagnosis not present

## 2020-05-26 DIAGNOSIS — I10 Essential (primary) hypertension: Secondary | ICD-10-CM

## 2020-05-26 DIAGNOSIS — I519 Heart disease, unspecified: Secondary | ICD-10-CM

## 2020-05-26 DIAGNOSIS — I25118 Atherosclerotic heart disease of native coronary artery with other forms of angina pectoris: Secondary | ICD-10-CM | POA: Diagnosis not present

## 2020-05-26 DIAGNOSIS — E785 Hyperlipidemia, unspecified: Secondary | ICD-10-CM | POA: Diagnosis not present

## 2020-05-26 DIAGNOSIS — I4510 Unspecified right bundle-branch block: Secondary | ICD-10-CM

## 2020-05-26 NOTE — Patient Instructions (Signed)

## 2020-08-05 ENCOUNTER — Other Ambulatory Visit: Payer: Self-pay | Admitting: Cardiology

## 2020-08-08 DIAGNOSIS — M25571 Pain in right ankle and joints of right foot: Secondary | ICD-10-CM | POA: Diagnosis not present

## 2020-08-08 DIAGNOSIS — Z6825 Body mass index (BMI) 25.0-25.9, adult: Secondary | ICD-10-CM | POA: Diagnosis not present

## 2020-08-10 DIAGNOSIS — S8263XA Displaced fracture of lateral malleolus of unspecified fibula, initial encounter for closed fracture: Secondary | ICD-10-CM | POA: Diagnosis not present

## 2020-08-18 DIAGNOSIS — S8264XA Nondisplaced fracture of lateral malleolus of right fibula, initial encounter for closed fracture: Secondary | ICD-10-CM | POA: Diagnosis not present

## 2020-08-24 DIAGNOSIS — H35033 Hypertensive retinopathy, bilateral: Secondary | ICD-10-CM | POA: Diagnosis not present

## 2020-08-24 DIAGNOSIS — E119 Type 2 diabetes mellitus without complications: Secondary | ICD-10-CM | POA: Diagnosis not present

## 2020-08-24 DIAGNOSIS — H5203 Hypermetropia, bilateral: Secondary | ICD-10-CM | POA: Diagnosis not present

## 2020-08-24 DIAGNOSIS — H25813 Combined forms of age-related cataract, bilateral: Secondary | ICD-10-CM | POA: Diagnosis not present

## 2020-08-24 DIAGNOSIS — H52223 Regular astigmatism, bilateral: Secondary | ICD-10-CM | POA: Diagnosis not present

## 2020-08-24 DIAGNOSIS — H11003 Unspecified pterygium of eye, bilateral: Secondary | ICD-10-CM | POA: Diagnosis not present

## 2020-08-24 DIAGNOSIS — H02839 Dermatochalasis of unspecified eye, unspecified eyelid: Secondary | ICD-10-CM | POA: Diagnosis not present

## 2020-08-24 DIAGNOSIS — Z7984 Long term (current) use of oral hypoglycemic drugs: Secondary | ICD-10-CM | POA: Diagnosis not present

## 2020-09-07 DIAGNOSIS — E1169 Type 2 diabetes mellitus with other specified complication: Secondary | ICD-10-CM | POA: Diagnosis not present

## 2020-09-07 DIAGNOSIS — E782 Mixed hyperlipidemia: Secondary | ICD-10-CM | POA: Diagnosis not present

## 2020-09-07 DIAGNOSIS — S8264XA Nondisplaced fracture of lateral malleolus of right fibula, initial encounter for closed fracture: Secondary | ICD-10-CM | POA: Diagnosis not present

## 2020-09-08 ENCOUNTER — Other Ambulatory Visit: Payer: Self-pay | Admitting: Cardiology

## 2020-09-19 DIAGNOSIS — E785 Hyperlipidemia, unspecified: Secondary | ICD-10-CM | POA: Diagnosis not present

## 2020-09-19 DIAGNOSIS — E1169 Type 2 diabetes mellitus with other specified complication: Secondary | ICD-10-CM | POA: Diagnosis not present

## 2020-09-19 DIAGNOSIS — Z0000 Encounter for general adult medical examination without abnormal findings: Secondary | ICD-10-CM | POA: Diagnosis not present

## 2020-09-19 DIAGNOSIS — S82401A Unspecified fracture of shaft of right fibula, initial encounter for closed fracture: Secondary | ICD-10-CM | POA: Diagnosis not present

## 2020-09-19 DIAGNOSIS — E782 Mixed hyperlipidemia: Secondary | ICD-10-CM | POA: Diagnosis not present

## 2020-09-19 DIAGNOSIS — Z6826 Body mass index (BMI) 26.0-26.9, adult: Secondary | ICD-10-CM | POA: Diagnosis not present

## 2020-09-19 DIAGNOSIS — Z1331 Encounter for screening for depression: Secondary | ICD-10-CM | POA: Diagnosis not present

## 2020-09-28 DIAGNOSIS — S8264XA Nondisplaced fracture of lateral malleolus of right fibula, initial encounter for closed fracture: Secondary | ICD-10-CM | POA: Diagnosis not present

## 2020-10-17 DIAGNOSIS — S8263XA Displaced fracture of lateral malleolus of unspecified fibula, initial encounter for closed fracture: Secondary | ICD-10-CM | POA: Diagnosis not present

## 2020-10-19 ENCOUNTER — Other Ambulatory Visit: Payer: Self-pay | Admitting: Cardiology

## 2020-10-19 NOTE — Telephone Encounter (Signed)
Rx refill sent to pharmacy. 

## 2020-10-26 DIAGNOSIS — S8264XA Nondisplaced fracture of lateral malleolus of right fibula, initial encounter for closed fracture: Secondary | ICD-10-CM | POA: Diagnosis not present

## 2020-10-31 DIAGNOSIS — H2513 Age-related nuclear cataract, bilateral: Secondary | ICD-10-CM | POA: Diagnosis not present

## 2020-10-31 DIAGNOSIS — Z01818 Encounter for other preprocedural examination: Secondary | ICD-10-CM | POA: Diagnosis not present

## 2020-10-31 DIAGNOSIS — H11051 Peripheral pterygium, progressive, right eye: Secondary | ICD-10-CM | POA: Diagnosis not present

## 2020-11-07 DIAGNOSIS — H11001 Unspecified pterygium of right eye: Secondary | ICD-10-CM | POA: Diagnosis not present

## 2020-11-07 DIAGNOSIS — H11051 Peripheral pterygium, progressive, right eye: Secondary | ICD-10-CM | POA: Diagnosis not present

## 2020-11-08 DIAGNOSIS — H11051 Peripheral pterygium, progressive, right eye: Secondary | ICD-10-CM | POA: Diagnosis not present

## 2020-11-23 DIAGNOSIS — H35033 Hypertensive retinopathy, bilateral: Secondary | ICD-10-CM | POA: Diagnosis not present

## 2020-11-23 DIAGNOSIS — H52202 Unspecified astigmatism, left eye: Secondary | ICD-10-CM | POA: Diagnosis not present

## 2020-11-23 DIAGNOSIS — H02839 Dermatochalasis of unspecified eye, unspecified eyelid: Secondary | ICD-10-CM | POA: Diagnosis not present

## 2020-11-23 DIAGNOSIS — H11003 Unspecified pterygium of eye, bilateral: Secondary | ICD-10-CM | POA: Diagnosis not present

## 2020-11-23 DIAGNOSIS — H5203 Hypermetropia, bilateral: Secondary | ICD-10-CM | POA: Diagnosis not present

## 2020-11-23 DIAGNOSIS — Z7984 Long term (current) use of oral hypoglycemic drugs: Secondary | ICD-10-CM | POA: Diagnosis not present

## 2020-11-23 DIAGNOSIS — H2512 Age-related nuclear cataract, left eye: Secondary | ICD-10-CM | POA: Diagnosis not present

## 2020-11-23 DIAGNOSIS — H52223 Regular astigmatism, bilateral: Secondary | ICD-10-CM | POA: Diagnosis not present

## 2020-11-23 DIAGNOSIS — E119 Type 2 diabetes mellitus without complications: Secondary | ICD-10-CM | POA: Diagnosis not present

## 2020-12-07 DIAGNOSIS — H52223 Regular astigmatism, bilateral: Secondary | ICD-10-CM | POA: Diagnosis not present

## 2020-12-07 DIAGNOSIS — H02839 Dermatochalasis of unspecified eye, unspecified eyelid: Secondary | ICD-10-CM | POA: Diagnosis not present

## 2020-12-07 DIAGNOSIS — H2512 Age-related nuclear cataract, left eye: Secondary | ICD-10-CM | POA: Diagnosis not present

## 2020-12-07 DIAGNOSIS — Z7984 Long term (current) use of oral hypoglycemic drugs: Secondary | ICD-10-CM | POA: Diagnosis not present

## 2020-12-07 DIAGNOSIS — H35033 Hypertensive retinopathy, bilateral: Secondary | ICD-10-CM | POA: Diagnosis not present

## 2020-12-07 DIAGNOSIS — H2511 Age-related nuclear cataract, right eye: Secondary | ICD-10-CM | POA: Diagnosis not present

## 2020-12-07 DIAGNOSIS — H11003 Unspecified pterygium of eye, bilateral: Secondary | ICD-10-CM | POA: Diagnosis not present

## 2020-12-07 DIAGNOSIS — H5203 Hypermetropia, bilateral: Secondary | ICD-10-CM | POA: Diagnosis not present

## 2020-12-07 DIAGNOSIS — H52211 Irregular astigmatism, right eye: Secondary | ICD-10-CM | POA: Diagnosis not present

## 2020-12-07 DIAGNOSIS — E119 Type 2 diabetes mellitus without complications: Secondary | ICD-10-CM | POA: Diagnosis not present

## 2020-12-28 DIAGNOSIS — J329 Chronic sinusitis, unspecified: Secondary | ICD-10-CM | POA: Diagnosis not present

## 2020-12-28 DIAGNOSIS — J40 Bronchitis, not specified as acute or chronic: Secondary | ICD-10-CM | POA: Diagnosis not present

## 2020-12-28 DIAGNOSIS — Z20828 Contact with and (suspected) exposure to other viral communicable diseases: Secondary | ICD-10-CM | POA: Diagnosis not present

## 2020-12-29 ENCOUNTER — Encounter: Payer: Self-pay | Admitting: Cardiology

## 2020-12-29 ENCOUNTER — Ambulatory Visit: Payer: PPO | Admitting: Cardiology

## 2020-12-29 ENCOUNTER — Other Ambulatory Visit: Payer: Self-pay

## 2020-12-29 VITALS — BP 120/80 | HR 96 | Ht 70.0 in | Wt 177.0 lb

## 2020-12-29 DIAGNOSIS — E785 Hyperlipidemia, unspecified: Secondary | ICD-10-CM

## 2020-12-29 DIAGNOSIS — I25118 Atherosclerotic heart disease of native coronary artery with other forms of angina pectoris: Secondary | ICD-10-CM

## 2020-12-29 DIAGNOSIS — I255 Ischemic cardiomyopathy: Secondary | ICD-10-CM

## 2020-12-29 DIAGNOSIS — E119 Type 2 diabetes mellitus without complications: Secondary | ICD-10-CM

## 2020-12-29 DIAGNOSIS — I10 Essential (primary) hypertension: Secondary | ICD-10-CM

## 2020-12-29 DIAGNOSIS — E1169 Type 2 diabetes mellitus with other specified complication: Secondary | ICD-10-CM

## 2020-12-29 NOTE — Progress Notes (Signed)
Cardiology Office Note:    Date:  12/29/2020   ID:  Nicholas Solian., DOB [birth year], MRN [id number]  PCP:  Nicholas Auer, MD  Cardiologist:  Nicholas Herrlich, MD    Referring MD: Nicholas Auer, MD    ASSESSMENT:    1. Ischemic cardiomyopathy   2. Coronary artery disease of native artery of native heart with stable angina pectoris (HCC)   3. Primary hypertension   4. Dyslipidemia associated with type 2 diabetes mellitus (HCC)   5. Type 2 diabetes mellitus without complication, without long-term current use of insulin (HCC)    PLAN:    In order of problems listed above:  Nicholas Mills continues to do well Marked improvement in ejection fraction New York Heart Association class I on current medical regimen including SGLT2 inhibitor beta-blocker and valsartan as a financial barrier to [place] for recheck ejection fraction less than 35-40 holding to go back to [employer].  We will repeat echocardiogram and if EF is reduced we will change treatment Stable CAD New York Heart Association class I post bypass continue current treatment Well-controlled on guideline directed therapy ARB beta-blocker Lipids are at target high risk patient post bypass continue his high intensity statin Well-controlled SGLT2 inhibitor is a good choice for him from a cardioprotective viewpoint   Next appointment: 9 months   Medication Adjustments/Labs and Tests Ordered: Current medicines are reviewed at length with the patient today.  Concerns regarding medicines are outlined above.  No orders of the defined types were placed in this encounter.  No orders of the defined types were placed in this encounter.   Chief Complaint  Patient presents with   Follow-up   Coronary Artery Disease   Cardiomyopathy    History of Present Illness:    Nicholas Hevia. is a 70 y.o. male with a hx of CAD CABG 07/18/2018 he initially had severe LV dysfunction EF of 28% prior to CABG right bundle branch block brief postoperative  atrial fibrillation treated with amiodarone type 2 diabetes hypertension and hyperlipidemia.  Most recent echocardiogram July 2020 showed EF of 40 to 45% and with elevated proBNP level he was placed on Entresto which unfortunately discontinued due to cost.  He was seen 11/23/2019 taking valsartan.  He was last seen 05/26/2020.  Compliance with diet, lifestyle and medications: Yes  Nicholas Mills continues to do well is meaningful recovery from bypass good strength and endurance and has no exercise intolerance edema shortness of breath chest pain palpitation or syncope.  He is placed on SGLT2 inhibitor for his diabetes.  Last A1c was 6.3% and I told him it is particularly beneficial for cardiomyopathy most recent labs 09/07/2020 cholesterol 140 LDL 37 triglycerides 100 HDL 49 hemoglobin 14.7 creatinine 1.0 potassium 3.6  Past Medical History:  Diagnosis Date   Coronary artery disease    a. 07/15/18 cardiac cath multivessel CAD, CT surgery consulted b.    Diabetes mellitus without complication (HCC)    Hiatal hernia    Hx of CABG    a. 07/18/18 LIMA to LAD - SVG to diagonal 2 - SVG to Ramus and OM - SVG to PDA   Hyperlipidemia    Hypertension    PAF (paroxysmal atrial fibrillation) (HCC)    a. briefly post op CABG 07/18/18 treated with amiodarone   Right bundle branch block    Syncope     Past Surgical History:  Procedure Laterality Date   CARDIAC CATHETERIZATION     CARPAL TUNNEL RELEASE  CORONARY ARTERY BYPASS GRAFT N/A 07/18/2018   Procedure: CORONARY ARTERY BYPASS GRAFTING (CABG) X 5 USING LEFT INTERNAL MAMMARY ARTERY AND RIGHT SAPHENOUS VEIN HARVESTED ENDOSCOPICALLY;  Surgeon: Alleen Borne, MD;  Location: [facility] OR;  Service: Open Heart Surgery;  Laterality: N/A;   LEFT HEART CATH AND CORONARY ANGIOGRAPHY N/A 07/15/2018   Procedure: LEFT HEART CATH AND CORONARY ANGIOGRAPHY;  Surgeon: Corky Crafts, MD;  Location: [facility] INVASIVE CV LAB;  Service: Cardiovascular;  Laterality: N/A;   TEE WITHOUT  CARDIOVERSION N/A 07/18/2018   Procedure: TRANSESOPHAGEAL ECHOCARDIOGRAM (TEE);  Surgeon: Alleen Borne, MD;  Location: [facility] OR;  Service: Open Heart Surgery;  Laterality: N/A;   VASECTOMY      Current Medications: Current Meds  Medication Sig   aspirin EC 81 MG tablet Take 1 tablet (81 mg total) by mouth daily.   Coenzyme Q10 (COQ10) 100 MG CAPS Take 100 mg by mouth daily.    JARDIANCE 25 MG TABS tablet Take 25 mg by mouth daily.   metFORMIN (GLUCOPHAGE-XR) 500 MG 24 hr tablet Take 500 mg by mouth 2 (two) times daily.   metoprolol tartrate (LOPRESSOR) 25 MG tablet TAKE 1 TABLET BY MOUTH TWICE (2) DAILY   rosuvastatin (CRESTOR) 10 MG tablet TAKE 1 TABLET BY MOUTH ONCE (1) DAILY   Saw Palmetto, Serenoa repens, (SAW PALMETTO PO) Take 1 capsule by mouth daily.   valsartan (DIOVAN) 40 MG tablet TAKE 1 TABLET BY MOUTH TWICE (2) DAILY   zolpidem (AMBIEN) 10 MG tablet Take 10 mg by mouth at bedtime as needed for sleep.     Allergies:   Patient has no known allergies.   Social History   Socioeconomic History   Marital status: Divorced    Spouse name: Not on file   Number of children: Not on file   Years of education: Not on file   Highest education level: Not on file  Occupational History   Not on file  Tobacco Use   Smoking status: Former   Smokeless tobacco: Never  Vaping Use   Vaping Use: Never used  Substance and Sexual Activity   Alcohol use: Not on file    Comment: beer occasionally   Drug use: Never   Sexual activity: Not on file  Other Topics Concern   Not on file  Social History Narrative   Not on file   Social Determinants of Health   Financial Resource Strain: Not on file  Food Insecurity: Not on file  Transportation Needs: Not on file  Physical Activity: Not on file  Stress: Not on file  Social Connections: Not on file     Family History: The patient's family history includes CAD in his mother; Macular degeneration in his mother; Obesity in his sister;  Peripheral vascular disease in his father; Valvular heart disease in his mother. ROS:   Please see the history of present illness.    All other systems reviewed and are negative.  EKGs/Labs/Other Studies Reviewed:    The following studies were reviewed today:    Recent Labs: See history   Physical Exam:    VS:  BP 120/80 (BP Location: Right Arm, Patient Position: Sitting, Cuff Size: Normal)   Pulse 96   Ht 5\' 10"  (1.778 m)   Wt 177 lb (80.3 kg)   SpO2 93%   BMI 25.40 kg/m     Wt Readings from Last 3 Encounters:  12/29/20 177 lb (80.3 kg)  05/26/20 185 lb (83.9 kg)  11/23/19 184 lb 12.8  oz (83.8 kg)     GEN: He looks healthy well nourished, well developed in no acute distress HEENT: Normal NECK: No JVD; No carotid bruits LYMPHATICS: No lymphadenopathy CARDIAC: RRR, no murmurs, rubs, gallops RESPIRATORY:  Clear to auscultation without rales, wheezing or rhonchi  ABDOMEN: Soft, non-tender, non-distended MUSCULOSKELETAL:  No edema; No deformity  SKIN: Warm and dry NEUROLOGIC:  Alert and oriented x 3 PSYCHIATRIC:  Normal affect    Signed, Nicholas Herrlich, MD  12/29/2020 3:15 PM    [facility]

## 2020-12-29 NOTE — Patient Instructions (Signed)
Medication Instructions:  °Your physician recommends that you continue on your current medications as directed. Please refer to the Current Medication list given to you today. ° °*If you need a refill on your cardiac medications before your next appointment, please call your pharmacy* ° ° °Lab Work: °None °If you have labs (blood work) drawn today and your tests are completely normal, you will receive your results only by: °MyChart Message (if you have MyChart) OR °A paper copy in the mail °If you have any lab test that is abnormal or we need to change your treatment, we will call you to review the results. ° ° °Testing/Procedures: °Your physician has requested that you have an echocardiogram. Echocardiography is a painless test that uses sound waves to create images of your heart. It provides your doctor with information about the size and shape of your heart and how well your heart’s chambers and valves are working. This procedure takes approximately one hour. There are no restrictions for this procedure. ° ° ° °Follow-Up: °At [facility], you and your health needs are our priority.  As part of our continuing mission to provide you with exceptional heart care, we have created designated Provider Care Teams.  These Care Teams include your primary Cardiologist (physician) and Advanced Practice Providers (APPs -  Physician Assistants and Nurse Practitioners) who all work together to provide you with the care you need, when you need it. ° °We recommend signing up for the patient portal called "MyChart".  Sign up information is provided on this After Visit Summary.  MyChart is used to connect with patients for Virtual Visits (Telemedicine).  Patients are able to view lab/test results, encounter notes, upcoming appointments, etc.  Non-urgent messages can be sent to your provider as well.   °To learn more about what you can do with MyChart, go to [email or web address].com.   ° °Your next appointment:   °9  month(s) ° °The format for your next appointment:   °In Person ° °Provider:   °Brian Munley, MD  ° ° °Other Instructions ° ° °

## 2021-01-09 ENCOUNTER — Ambulatory Visit (INDEPENDENT_AMBULATORY_CARE_PROVIDER_SITE_OTHER): Payer: PPO

## 2021-01-09 ENCOUNTER — Other Ambulatory Visit: Payer: Self-pay

## 2021-01-09 DIAGNOSIS — I255 Ischemic cardiomyopathy: Secondary | ICD-10-CM | POA: Diagnosis not present

## 2021-01-09 LAB — ECHOCARDIOGRAM COMPLETE
Area-P 1/2: 3.87 cm2
Calc EF: 44.5 %
S' Lateral: 3.2 cm
Single Plane A2C EF: 35.6 %
Single Plane A4C EF: 51.9 %

## 2021-01-10 ENCOUNTER — Telehealth: Payer: Self-pay | Admitting: Cardiology

## 2021-01-10 ENCOUNTER — Telehealth: Payer: Self-pay

## 2021-01-10 NOTE — Telephone Encounter (Signed)
Left message on patients voicemail to please return our call.   

## 2021-01-10 NOTE — Telephone Encounter (Signed)
Follow Up: ° ° ° ° ° °Patient is returning Morgan's call from today. °

## 2021-01-10 NOTE — Telephone Encounter (Signed)
Spoke with patient regarding results and recommendation.  Patient verbalizes understanding and is agreeable to plan of care. Advised patient to call back with any issues or concerns.  

## 2021-01-10 NOTE — Telephone Encounter (Signed)
-----   Message from Baldo Daub, MD sent at 01/10/2021  7:41 AM EST ----- Good result further improvement I will continue his current medications.

## 2021-01-27 ENCOUNTER — Other Ambulatory Visit: Payer: Self-pay | Admitting: Cardiology

## 2021-01-30 NOTE — Telephone Encounter (Signed)
Metoprolol tartrate 25 mg # 180 x 4 refills sent to   [facility] - [place], [place] - [address]

## 2021-03-03 DIAGNOSIS — M25552 Pain in left hip: Secondary | ICD-10-CM | POA: Diagnosis not present

## 2021-03-03 DIAGNOSIS — S39012A Strain of muscle, fascia and tendon of lower back, initial encounter: Secondary | ICD-10-CM | POA: Diagnosis not present

## 2021-03-06 ENCOUNTER — Telehealth: Payer: Self-pay | Admitting: Cardiology

## 2021-03-06 NOTE — Telephone Encounter (Signed)
Pt has been prsescribed moloxicam for his back.. but Dr. Linna Caprice @ [facility] is wanting Dr. Dulce Sellar to approve this first... please advise

## 2021-03-06 NOTE — Telephone Encounter (Signed)
Patient made aware of Dr. Hulen Shouts response. Ok for him to take Meloxicam.

## 2021-03-06 NOTE — Telephone Encounter (Signed)
Nicholas Daub, MD  You 9 minutes ago (12:09 PM)   Yes

## 2021-03-07 NOTE — Telephone Encounter (Signed)
Fax has been sent at this time 

## 2021-03-07 NOTE — Telephone Encounter (Signed)
Patient called stating he would like to approval to be faxed to [phone]

## 2021-03-15 ENCOUNTER — Other Ambulatory Visit: Payer: Self-pay | Admitting: Cardiology

## 2021-03-29 DIAGNOSIS — M5451 Vertebrogenic low back pain: Secondary | ICD-10-CM | POA: Diagnosis not present

## 2021-04-11 DIAGNOSIS — Z7984 Long term (current) use of oral hypoglycemic drugs: Secondary | ICD-10-CM | POA: Diagnosis not present

## 2021-04-11 DIAGNOSIS — E119 Type 2 diabetes mellitus without complications: Secondary | ICD-10-CM | POA: Diagnosis not present

## 2021-04-11 DIAGNOSIS — H35033 Hypertensive retinopathy, bilateral: Secondary | ICD-10-CM | POA: Diagnosis not present

## 2021-04-11 DIAGNOSIS — H02839 Dermatochalasis of unspecified eye, unspecified eyelid: Secondary | ICD-10-CM | POA: Diagnosis not present

## 2021-04-11 DIAGNOSIS — H26493 Other secondary cataract, bilateral: Secondary | ICD-10-CM | POA: Diagnosis not present

## 2021-04-11 DIAGNOSIS — H11003 Unspecified pterygium of eye, bilateral: Secondary | ICD-10-CM | POA: Diagnosis not present

## 2021-04-22 DIAGNOSIS — M5451 Vertebrogenic low back pain: Secondary | ICD-10-CM | POA: Diagnosis not present

## 2021-04-25 DIAGNOSIS — M5451 Vertebrogenic low back pain: Secondary | ICD-10-CM | POA: Diagnosis not present

## 2021-05-13 IMAGING — DX PORTABLE CHEST - 1 VIEW
1 series · 1 of 1 positions shown · non-contrast
Comparison: 07/19/2018

CLINICAL DATA: Status post CABG

EXAM:
PORTABLE CHEST 1 VIEW

[chest ap]
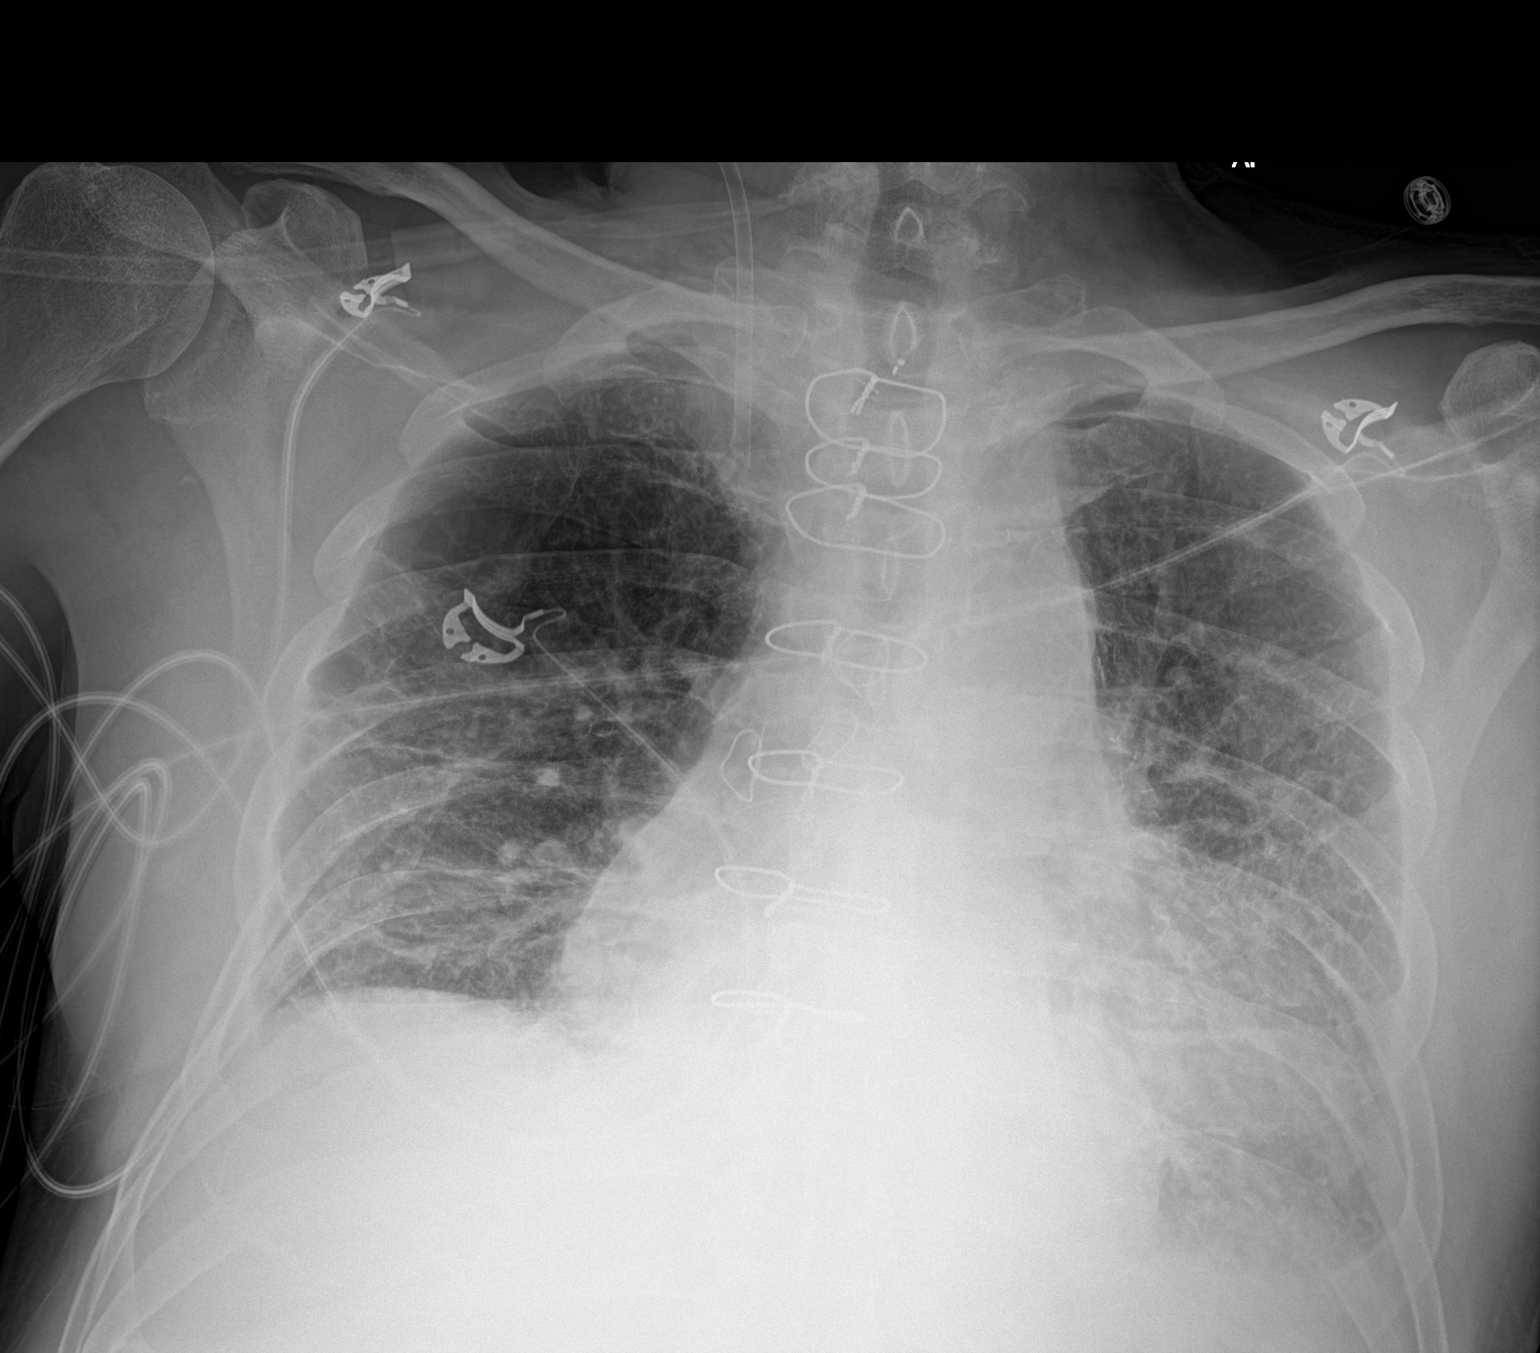

[1 of 1 positions shown; findings below may reference images not displayed]

FINDINGS: Interval removal of left chest tube and mediastinal drains. Interval
removal of right IJ Swan-Ganz catheter. Stable right IJ venous
sheath.

Mild left lower lung opacity, favoring a layering pleural effusion.
Chronic interstitial markings. No pneumothorax is seen.

The heart is top-normal in size. Postsurgical changes related to
prior CABG.

Median sternotomy.
IMPRESSION: Interval removal of left chest tube and mediastinal drains. No
pneumothorax is seen.

Suspected layering left pleural effusion.

## 2021-05-13 IMAGING — DX LEFT FOOT - 2 VIEW
2 series · 2 of 2 positions shown · non-contrast
Comparison: None.

CLINICAL DATA: Injury

EXAM:
LEFT FOOT - 2 VIEW

[foot ap]
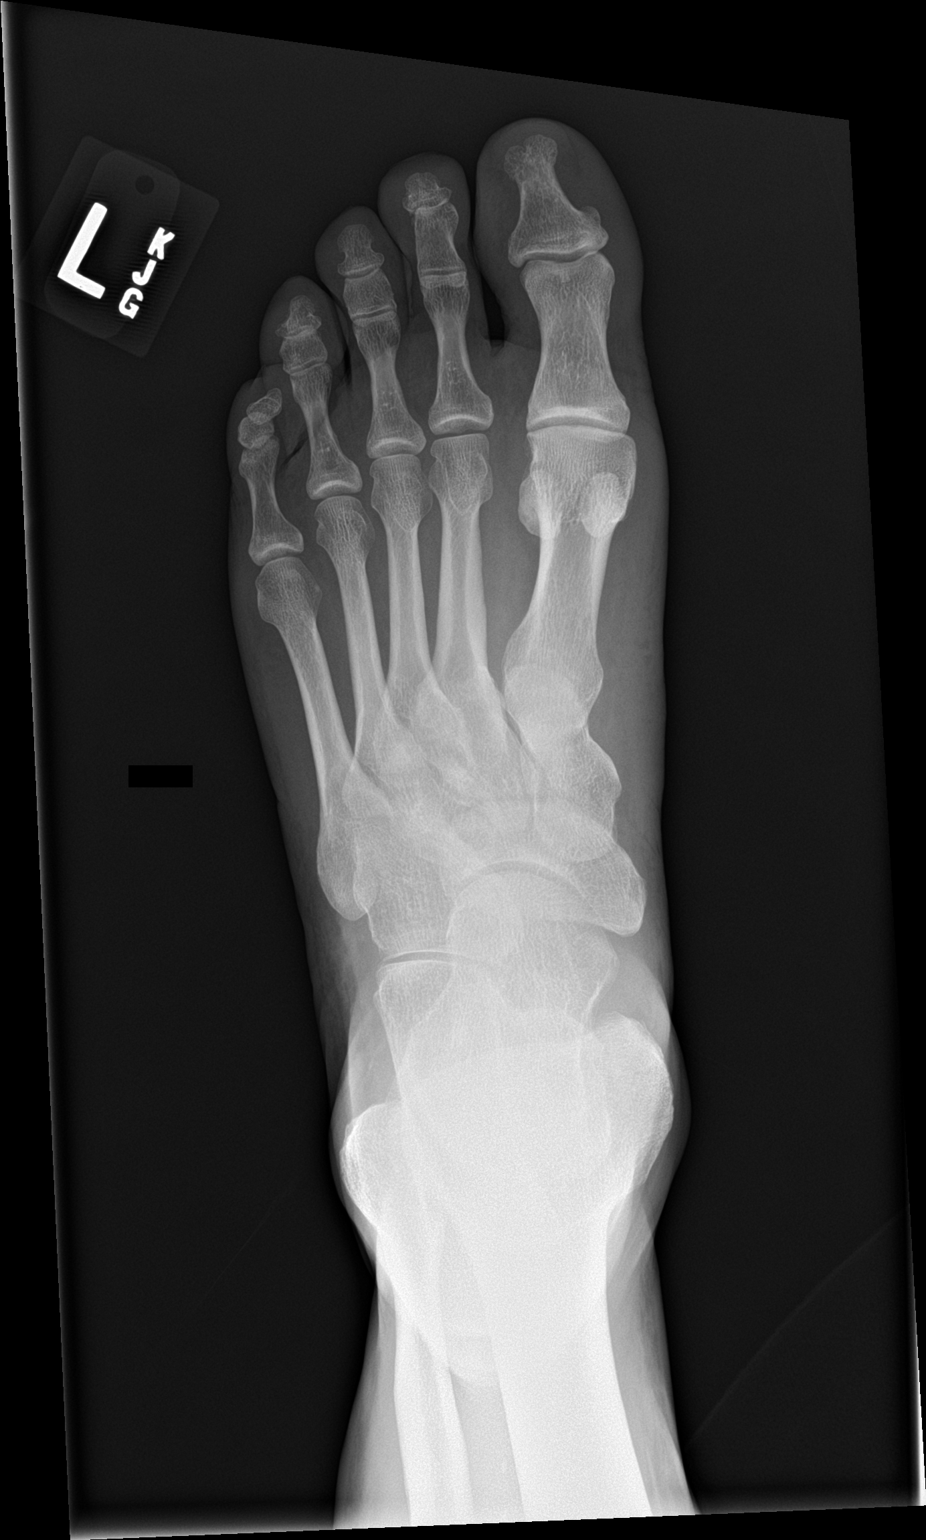

[foot lat]
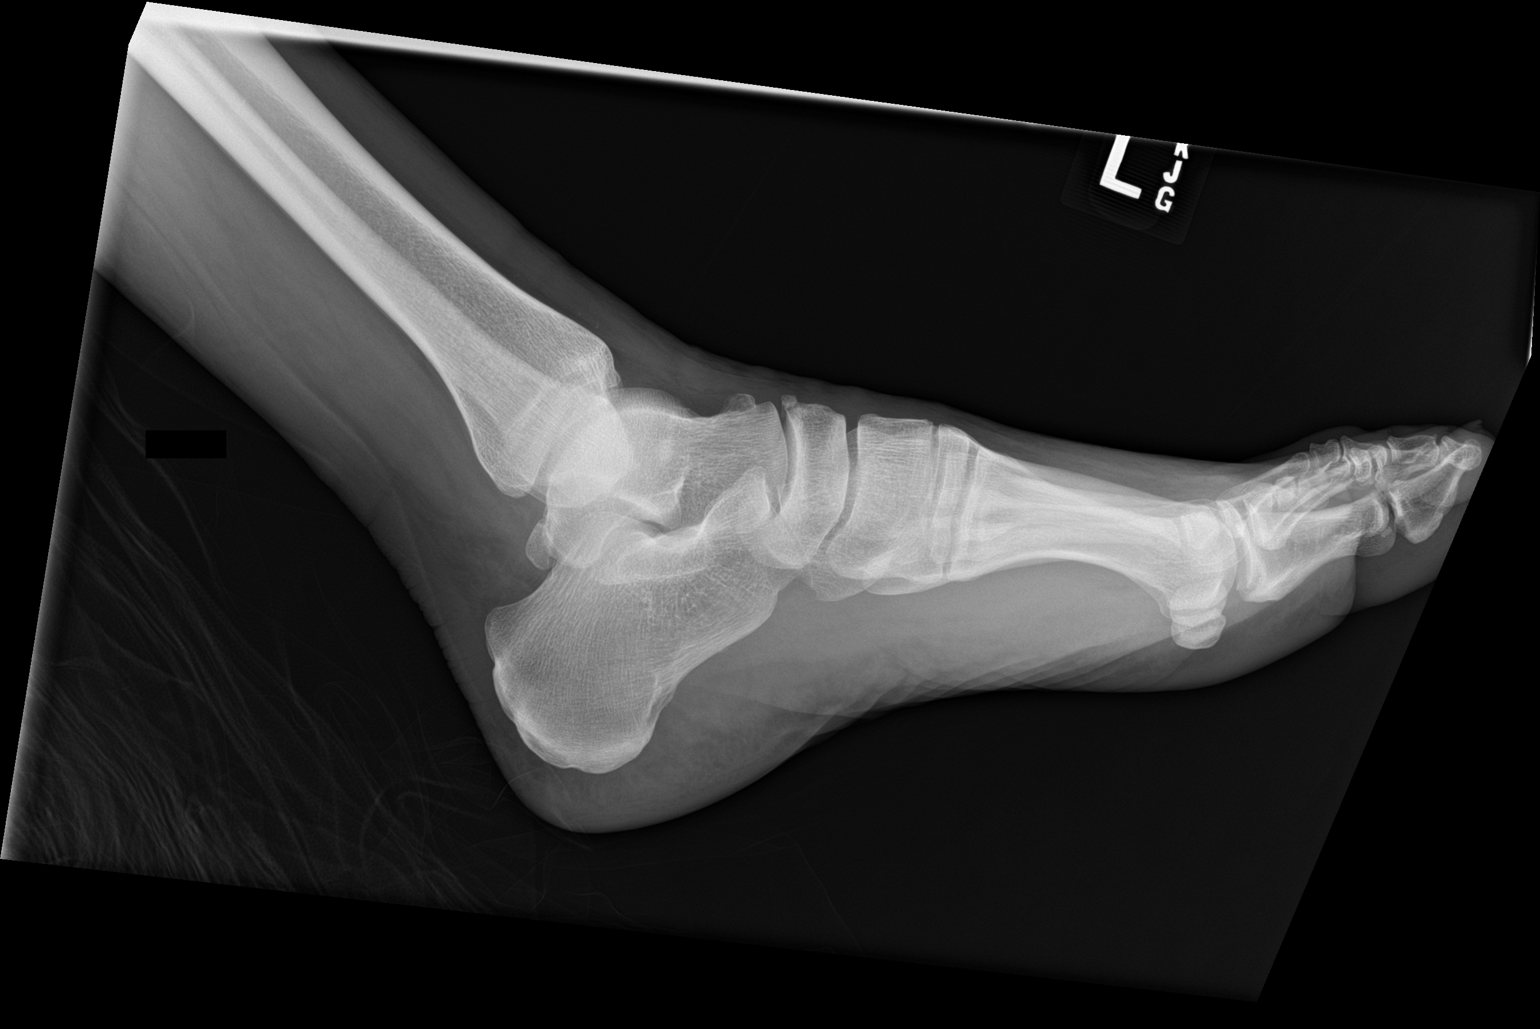

[2 of 2 positions shown; findings below may reference images not displayed]

FINDINGS: No fracture or dislocation is seen.

The joint spaces are preserved.

The visualized soft tissues are unremarkable.
IMPRESSION: Negative.

## 2021-05-19 DIAGNOSIS — M545 Low back pain, unspecified: Secondary | ICD-10-CM | POA: Diagnosis not present

## 2021-05-22 ENCOUNTER — Other Ambulatory Visit: Payer: Self-pay | Admitting: Cardiology

## 2021-05-29 IMAGING — DX CHEST - 2 VIEW
2 series · 2 of 2 positions shown · non-contrast
Comparison: 07/22/2018

CLINICAL DATA: Recent bypass surgery,nonsmoker,recheckpost bypass
surgery

EXAM:
CHEST - 2 VIEW

[chest pa]
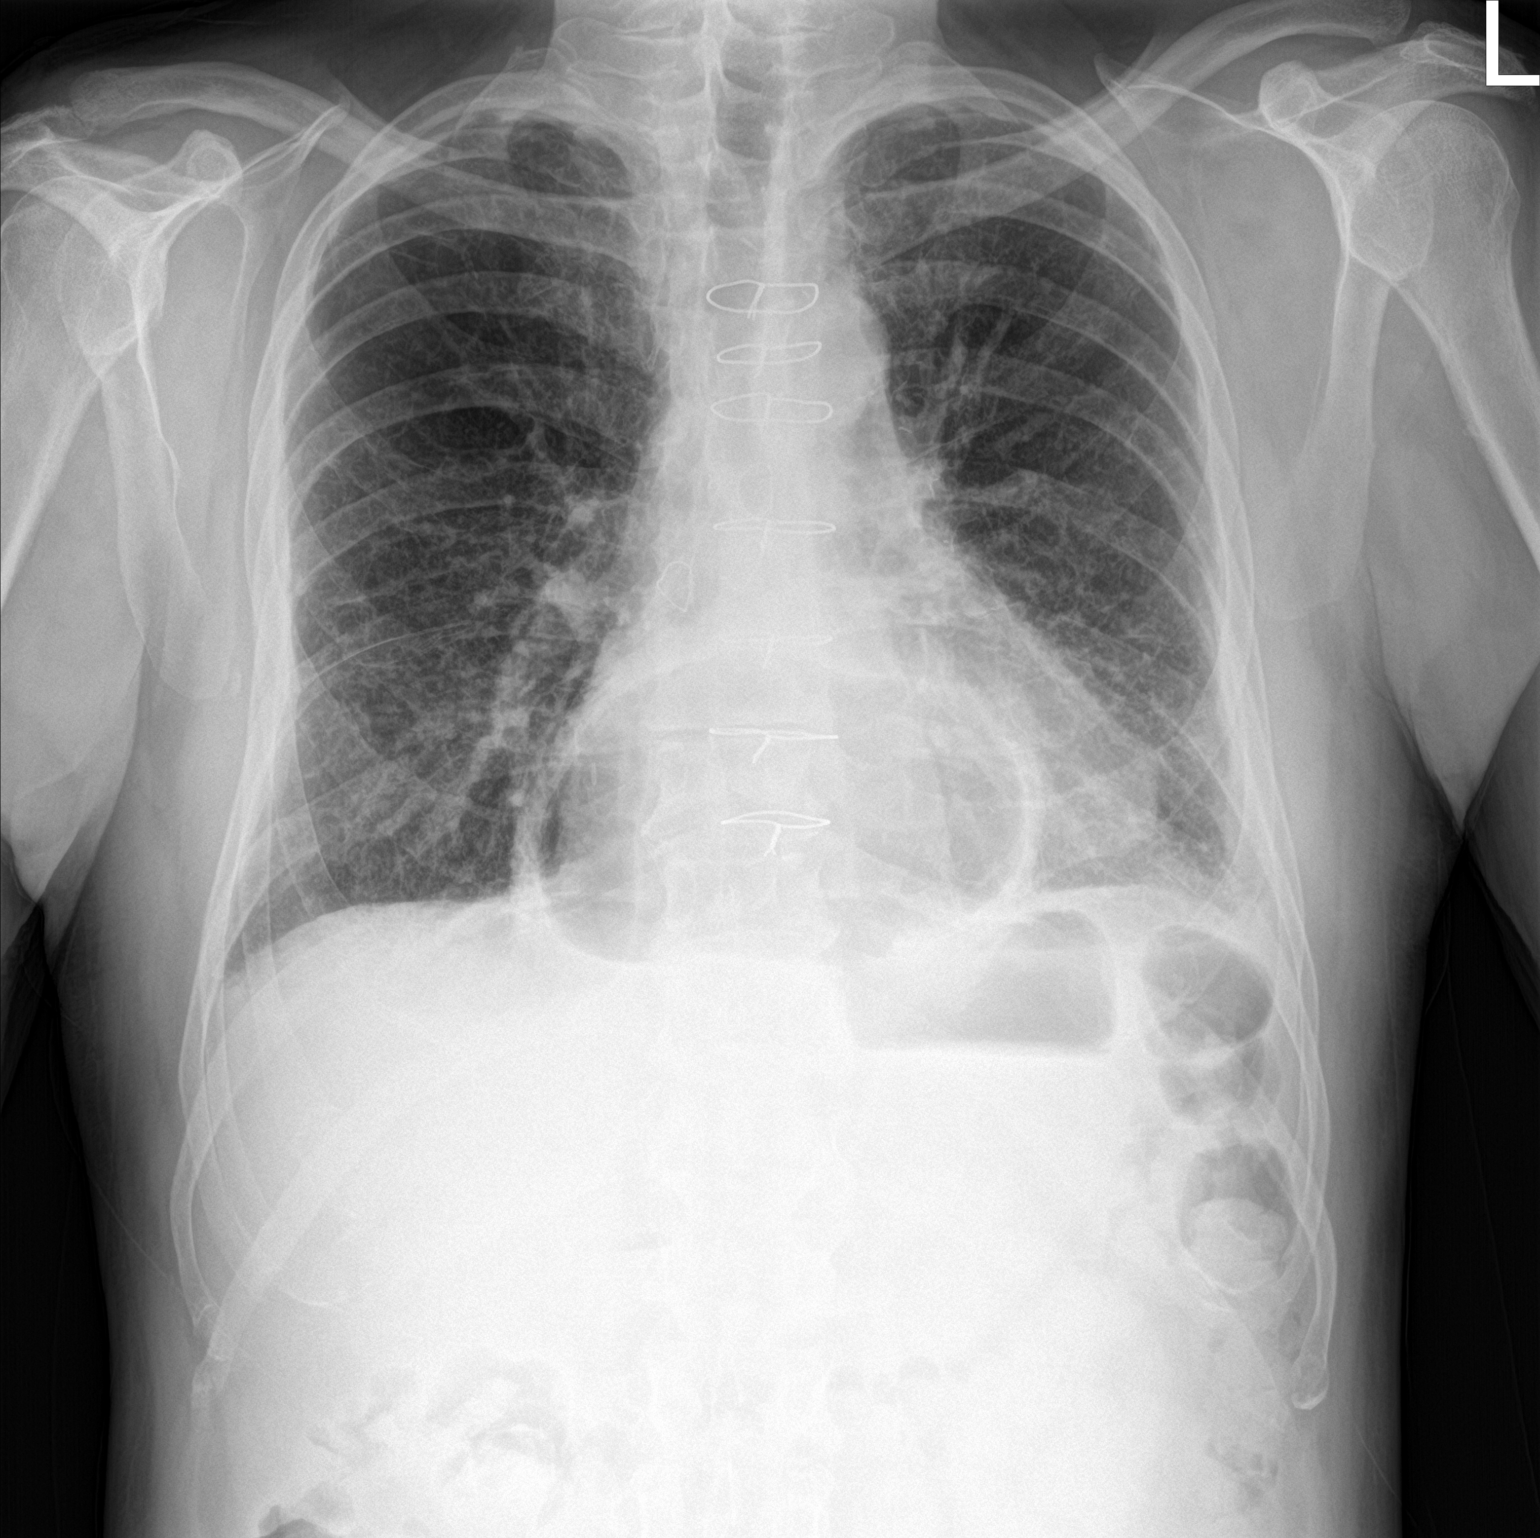

[chest lat]
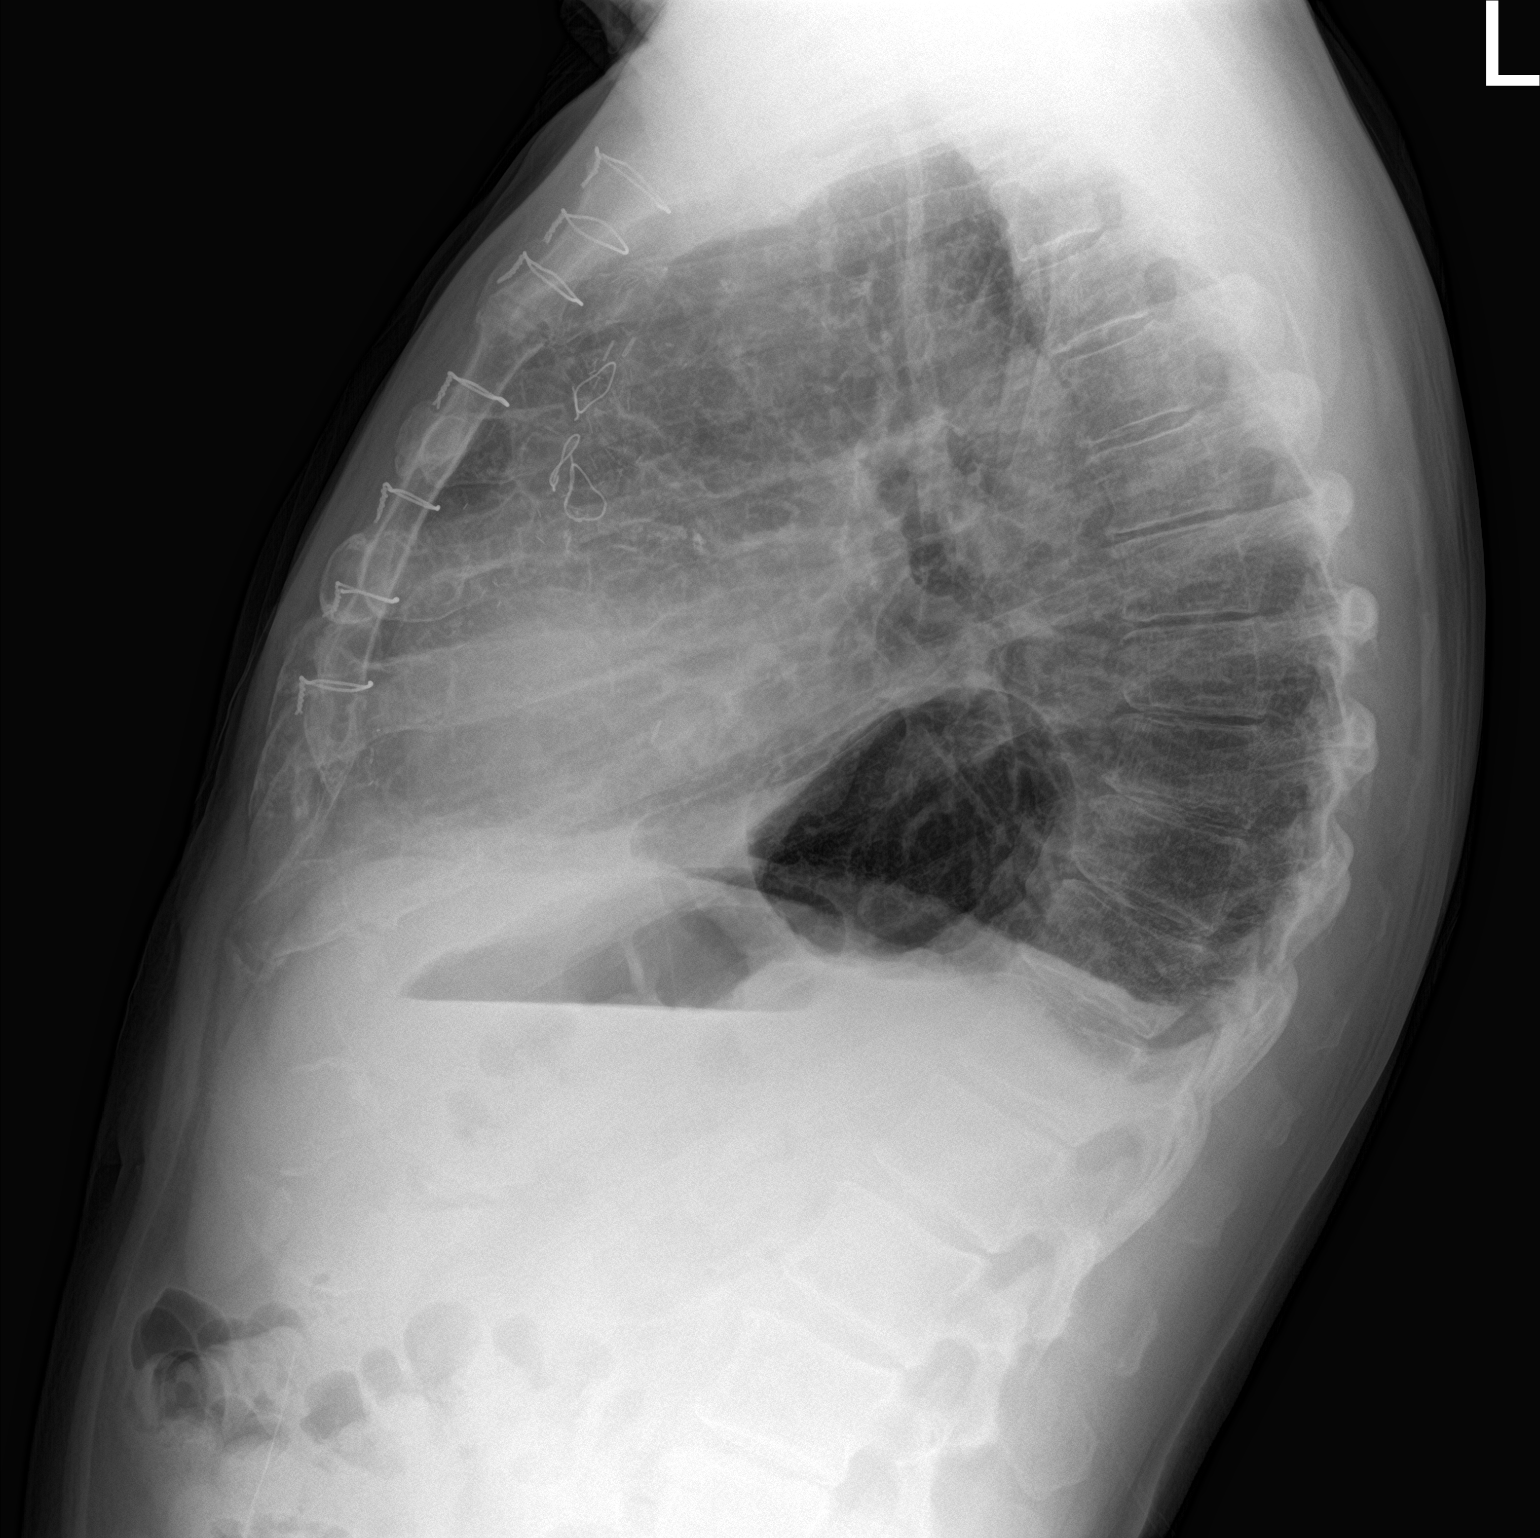

[2 of 2 positions shown; findings below may reference images not displayed]

FINDINGS: Sternotomy wires overlie stable cardiac silhouette. Large gas-filled
hiatal hernia noted. Mild LEFT basilar atelectasis. No pulmonary
edema. No pneumothorax.
IMPRESSION: Large hiatal hernia.  No acute cardiopulmonary process.

## 2021-06-13 DIAGNOSIS — M545 Low back pain, unspecified: Secondary | ICD-10-CM | POA: Diagnosis not present

## 2021-06-20 ENCOUNTER — Other Ambulatory Visit: Payer: Self-pay | Admitting: Cardiology

## 2021-07-05 DIAGNOSIS — Z125 Encounter for screening for malignant neoplasm of prostate: Secondary | ICD-10-CM | POA: Diagnosis not present

## 2021-07-05 DIAGNOSIS — E1169 Type 2 diabetes mellitus with other specified complication: Secondary | ICD-10-CM | POA: Diagnosis not present

## 2021-07-05 DIAGNOSIS — Z0000 Encounter for general adult medical examination without abnormal findings: Secondary | ICD-10-CM | POA: Diagnosis not present

## 2021-07-05 DIAGNOSIS — E785 Hyperlipidemia, unspecified: Secondary | ICD-10-CM | POA: Diagnosis not present

## 2021-07-06 ENCOUNTER — Telehealth: Payer: Self-pay | Admitting: Cardiology

## 2021-07-06 DIAGNOSIS — E1169 Type 2 diabetes mellitus with other specified complication: Secondary | ICD-10-CM | POA: Diagnosis not present

## 2021-07-06 DIAGNOSIS — G4700 Insomnia, unspecified: Secondary | ICD-10-CM | POA: Diagnosis not present

## 2021-07-06 DIAGNOSIS — E782 Mixed hyperlipidemia: Secondary | ICD-10-CM | POA: Diagnosis not present

## 2021-07-06 DIAGNOSIS — I2510 Atherosclerotic heart disease of native coronary artery without angina pectoris: Secondary | ICD-10-CM | POA: Diagnosis not present

## 2021-07-06 DIAGNOSIS — I4892 Unspecified atrial flutter: Secondary | ICD-10-CM | POA: Diagnosis not present

## 2021-07-06 DIAGNOSIS — R7401 Elevation of levels of liver transaminase levels: Secondary | ICD-10-CM | POA: Diagnosis not present

## 2021-07-06 DIAGNOSIS — E785 Hyperlipidemia, unspecified: Secondary | ICD-10-CM | POA: Diagnosis not present

## 2021-07-06 DIAGNOSIS — M545 Low back pain, unspecified: Secondary | ICD-10-CM | POA: Diagnosis not present

## 2021-07-06 DIAGNOSIS — Z6825 Body mass index (BMI) 25.0-25.9, adult: Secondary | ICD-10-CM | POA: Diagnosis not present

## 2021-07-06 NOTE — Telephone Encounter (Signed)
Received a call from his PCP Dr. Sherwood Gambler [facility] To be in atrial flutter today controlled rate 87 bpm asymptomatic was placed anticoagulant Eliquis and will see me next week. He had post bypass PAF in the past and had been on amiodarone.

## 2021-08-10 ENCOUNTER — Telehealth: Payer: Self-pay | Admitting: Physician Assistant

## 2021-08-10 ENCOUNTER — Encounter: Payer: Self-pay | Admitting: Physician Assistant

## 2021-08-10 NOTE — Telephone Encounter (Signed)
Left message for medical records, Nicholas Mills, at [facility] requesting EKG from 07/06/21 and any recent labs (CMET, CBC, TSH, lipid profile, Mg if they have them), per Ronie Spies, PA-C. This is for visit tomorrow on 08/11/21.  Provided office fax number to send requested documents to.

## 2021-08-10 NOTE — Telephone Encounter (Signed)
   Chart reviewed in prep for upcoming visit tomorrow. Per phone note 07/06/21 by Dr. Dulce Sellar, "Received a call from his PCP Dr. Sherwood [facility]ler [facility] To be in atrial flutter today controlled rate 87 bpm asymptomatic was placed anticoagulant Eliquis and will see me next week. He had post bypass PAF in the past and had been on amiodarone."  Do not see this EKG in our system so will route to triage to see if they can help get a copy of the EKG this message was referring to, as well as any recent labs on file from primary care (looking for CMET, CBC, TSH, lipid profile, Mg if they have them). Thanks.  Hollis Tuller PA-C

## 2021-08-11 ENCOUNTER — Ambulatory Visit (INDEPENDENT_AMBULATORY_CARE_PROVIDER_SITE_OTHER): Payer: PPO

## 2021-08-11 ENCOUNTER — Encounter: Payer: Self-pay | Admitting: Physician Assistant

## 2021-08-11 ENCOUNTER — Ambulatory Visit: Payer: PPO | Admitting: Physician Assistant

## 2021-08-11 VITALS — BP 118/62 | HR 82 | Ht 70.0 in | Wt 178.6 lb

## 2021-08-11 DIAGNOSIS — I480 Paroxysmal atrial fibrillation: Secondary | ICD-10-CM

## 2021-08-11 DIAGNOSIS — I4510 Unspecified right bundle-branch block: Secondary | ICD-10-CM

## 2021-08-11 DIAGNOSIS — I5042 Chronic combined systolic (congestive) and diastolic (congestive) heart failure: Secondary | ICD-10-CM

## 2021-08-11 DIAGNOSIS — I2510 Atherosclerotic heart disease of native coronary artery without angina pectoris: Secondary | ICD-10-CM

## 2021-08-11 DIAGNOSIS — E785 Hyperlipidemia, unspecified: Secondary | ICD-10-CM | POA: Diagnosis not present

## 2021-08-11 DIAGNOSIS — I255 Ischemic cardiomyopathy: Secondary | ICD-10-CM | POA: Diagnosis not present

## 2021-08-11 DIAGNOSIS — I4892 Unspecified atrial flutter: Secondary | ICD-10-CM

## 2021-08-11 DIAGNOSIS — I10 Essential (primary) hypertension: Secondary | ICD-10-CM

## 2021-08-12 LAB — COMPREHENSIVE METABOLIC PANEL
ALT: 15 IU/L (ref 0–44)
AST: 18 IU/L (ref 0–40)
Albumin/Globulin Ratio: 1.7 (ref 1.2–2.2)
Albumin: 4.8 g/dL — ABNORMAL HIGH (ref 3.7–4.7)
Alkaline Phosphatase: 69 IU/L (ref 44–121)
BUN/Creatinine Ratio: 13 (ref 10–24)
BUN: 15 mg/dL (ref 8–27)
Bilirubin Total: 0.5 mg/dL (ref 0.0–1.2)
CO2: 20 mmol/L (ref 20–29)
Calcium: 10 mg/dL (ref 8.6–10.2)
Chloride: 97 mmol/L (ref 96–106)
Creatinine, Ser: 1.12 mg/dL (ref 0.76–1.27)
Globulin, Total: 2.8 g/dL (ref 1.5–4.5)
Glucose: 123 mg/dL — ABNORMAL HIGH (ref 70–99)
Potassium: 4.8 mmol/L (ref 3.5–5.2)
Sodium: 136 mmol/L (ref 134–144)
Total Protein: 7.6 g/dL (ref 6.0–8.5)
eGFR: 70 mL/min/{1.73_m2} (ref >59)

## 2021-08-12 LAB — CBC
Hematocrit: 43.1 % (ref 37.5–51.0)
Hemoglobin: 15 g/dL (ref 13.0–17.7)
MCH: 32.2 pg (ref 26.6–33.0)
MCHC: 34.8 g/dL (ref 31.5–35.7)
MCV: 93 fL (ref 79–97)
Platelets: 163 10*3/uL (ref 150–450)
RBC: 4.66 x10E6/uL (ref 4.14–5.80)
RDW: 13.4 % (ref 11.6–15.4)
WBC: 8.2 10*3/uL (ref 3.4–10.8)

## 2021-08-12 LAB — TSH: TSH: 1.91 u[IU]/mL (ref 0.450–4.500)

## 2021-08-14 ENCOUNTER — Telehealth: Payer: Self-pay

## 2021-08-14 NOTE — Telephone Encounter (Addendum)
-----   Message from Laurann Montana, [place] sent at 08/13/2021 10:09 AM EDT ----- Please let pt know labs overall stable. Liver function improved from values that pcp did. Mildly elevated blood sugar noted but has diabetes. Please forward copy to primary care. Thanks. Continue plan as discussed.  Dunn, Dayna N, PA-C  P Cv Div Ch St Triage Addendum: Also just heard back from Dr.Munley - he feels patient is OK to stop aspirin (but continue Eliquis) - please let patient know when you call with lab results.

## 2021-08-15 DIAGNOSIS — I4892 Unspecified atrial flutter: Secondary | ICD-10-CM

## 2021-08-15 DIAGNOSIS — I480 Paroxysmal atrial fibrillation: Secondary | ICD-10-CM

## 2021-08-15 DIAGNOSIS — I4510 Unspecified right bundle-branch block: Secondary | ICD-10-CM | POA: Diagnosis not present

## 2021-08-29 DIAGNOSIS — I2510 Atherosclerotic heart disease of native coronary artery without angina pectoris: Secondary | ICD-10-CM | POA: Diagnosis not present

## 2021-08-29 DIAGNOSIS — I4892 Unspecified atrial flutter: Secondary | ICD-10-CM | POA: Diagnosis not present

## 2021-08-29 DIAGNOSIS — E785 Hyperlipidemia, unspecified: Secondary | ICD-10-CM | POA: Diagnosis not present

## 2021-08-29 DIAGNOSIS — I480 Paroxysmal atrial fibrillation: Secondary | ICD-10-CM | POA: Diagnosis not present

## 2021-08-30 ENCOUNTER — Other Ambulatory Visit: Payer: Self-pay

## 2021-08-30 ENCOUNTER — Ambulatory Visit (HOSPITAL_COMMUNITY): Payer: PPO

## 2021-09-11 ENCOUNTER — Ambulatory Visit (INDEPENDENT_AMBULATORY_CARE_PROVIDER_SITE_OTHER): Payer: PPO

## 2021-09-11 DIAGNOSIS — I2510 Atherosclerotic heart disease of native coronary artery without angina pectoris: Secondary | ICD-10-CM | POA: Diagnosis not present

## 2021-09-11 DIAGNOSIS — I4510 Unspecified right bundle-branch block: Secondary | ICD-10-CM

## 2021-09-11 DIAGNOSIS — I480 Paroxysmal atrial fibrillation: Secondary | ICD-10-CM

## 2021-09-11 DIAGNOSIS — I255 Ischemic cardiomyopathy: Secondary | ICD-10-CM

## 2021-09-11 DIAGNOSIS — E785 Hyperlipidemia, unspecified: Secondary | ICD-10-CM | POA: Diagnosis not present

## 2021-09-11 DIAGNOSIS — I10 Essential (primary) hypertension: Secondary | ICD-10-CM

## 2021-09-11 DIAGNOSIS — I5042 Chronic combined systolic (congestive) and diastolic (congestive) heart failure: Secondary | ICD-10-CM | POA: Diagnosis not present

## 2021-09-11 DIAGNOSIS — I4892 Unspecified atrial flutter: Secondary | ICD-10-CM | POA: Diagnosis not present

## 2021-09-11 LAB — ECHOCARDIOGRAM COMPLETE
Area-P 1/2: 5.54 cm2
Calc EF: 47.5 %
MV M vel: 4.65 m/s
MV Peak grad: 86.5 mmHg
Radius: 0.6 cm
Single Plane A2C EF: 42.2 %
Single Plane A4C EF: 55.1 %

## 2021-09-11 MED ORDER — PERFLUTREN LIPID MICROSPHERE
1.0000 mL | INTRAVENOUS | Status: AC | PRN
  Administered 2021-09-11: 6 mL via INTRAVENOUS

## 2021-09-12 ENCOUNTER — Telehealth: Payer: Self-pay | Admitting: Physician Assistant

## 2021-09-12 NOTE — Telephone Encounter (Signed)
Patient was calling back about his results

## 2021-09-13 NOTE — Telephone Encounter (Signed)
Laurann Montana, PA-C  09/11/2021  5:30 PM EDT     Please let pt know that echo shows some changes from the previous. EF squeeze function largely similar to before though mild reduction (40-45% compared to 45-50%). The right side of the heart is mildly leaky and there is moderately elevated pressure in the lungs. There is mild leaking of the mitral valve. Overall the study has not dramatically changed but it is noted that pressure in lungs have increased and stiffening went from grade 2 to grade 3. Can we move up follow-up appointment up to discuss monitoring for further symptoms and workup? OK to see me back if Dr. Dulce Sellar does not have availability.         The patient has been notified of the result and verbalized understanding.  All questions (if any) were answered. Theresia Majors, RN 09/13/2021 2:29 PM   Patient would prefer to see Dr. Dulce Sellar for this appointment. Advised that I would send his office a message and they would call him with a sooner appointment.

## 2021-10-09 DIAGNOSIS — R7401 Elevation of levels of liver transaminase levels: Secondary | ICD-10-CM | POA: Diagnosis not present

## 2021-10-09 DIAGNOSIS — E1169 Type 2 diabetes mellitus with other specified complication: Secondary | ICD-10-CM | POA: Diagnosis not present

## 2021-10-09 DIAGNOSIS — Z6825 Body mass index (BMI) 25.0-25.9, adult: Secondary | ICD-10-CM | POA: Diagnosis not present

## 2021-10-09 DIAGNOSIS — E785 Hyperlipidemia, unspecified: Secondary | ICD-10-CM | POA: Diagnosis not present

## 2021-10-09 DIAGNOSIS — I480 Paroxysmal atrial fibrillation: Secondary | ICD-10-CM | POA: Diagnosis not present

## 2021-10-09 DIAGNOSIS — Z0000 Encounter for general adult medical examination without abnormal findings: Secondary | ICD-10-CM | POA: Diagnosis not present

## 2021-10-09 DIAGNOSIS — Z1331 Encounter for screening for depression: Secondary | ICD-10-CM | POA: Diagnosis not present

## 2021-11-26 NOTE — Progress Notes (Deleted)
Cardiology Office Note:    Date:  11/26/2021   ID:  Nicholas Mantis., DOB 1950/03/13, MRN [id number]  PCP:  Mateo Flow, MD  Cardiologist:  Shirlee More, MD    Referring MD: Mateo Flow, MD    ASSESSMENT:    No diagnosis found. PLAN:    In order of problems listed above:  ***   Next appointment: ***   Medication Adjustments/Labs and Tests Ordered: Current medicines are reviewed at length with the patient today.  Concerns regarding medicines are outlined above.  No orders of the defined types were placed in this encounter.  No orders of the defined types were placed in this encounter.   No chief complaint on file.   History of Present Illness:    Nicholas Altizer. is a 71 y.o. male with a hx of CAD CABG 07/18/2018 severe left ventricular dysfunction initial EF 28% prior to bypass surgery right bundle branch block post CABG atrial fibrillation type 2 diabetes hypertension and hyperlipidemia last seen 12/29/2020 marked improvement on guideline directed therapy after bypass surgery.  Echocardiogram 01/09/2021 showed mild LV dysfunction EF 45 to 50% mild concentric LVH normal right ventricular size mild RV systolic dysfunction normal pulmonary artery pressure moderate left atrial enlargement.   1. Left ventricular ejection fraction, by estimation, is 45 to 50%. The  left ventricle has mildly decreased function. The left ventricle  demonstrates global hypokinesis. There is mild concentric left ventricular  hypertrophy. Left ventricular diastolic  parameters are consistent with Grade II diastolic dysfunction  (pseudonormalization). Elevated left atrial pressure. The average left  ventricular global longitudinal strain is -10.4 %. The global longitudinal  strain is abnormal.   2. Right ventricular systolic function is mildly reduced. The right  ventricular size is normal. There is normal pulmonary artery systolic  pressure.   3. Left atrial size was moderately dilated.    4. The mitral valve is normal in structure. Mild mitral valve  regurgitation. No evidence of mitral stenosis.   5. The aortic valve is tricuspid. Aortic valve regurgitation is not  visualized. Aortic valve sclerosis is present, with no evidence of aortic  valve stenosis.   6. The inferior vena cava is normal in size with greater than 50%  respiratory variability, suggesting right atrial pressure of 3 mmHg.  Compliance with diet, lifestyle and medications: *** Past Medical History:  Diagnosis Date   Coronary artery disease    a. 07/15/18 cardiac cath multivessel CAD, CT surgery consulted b.    Diabetes mellitus ([place])    Hiatal hernia    Hx of CABG    a. 07/18/18 LIMA to LAD - SVG to diagonal 2 - SVG to Ramus and OM - SVG to PDA   Hyperlipidemia    Hypertension    PAF (paroxysmal atrial fibrillation) ([place])    a. briefly post op CABG 07/18/18 treated with amiodarone   Right bundle branch block    Syncope     Past Surgical History:  Procedure Laterality Date   CARDIAC CATHETERIZATION     CARPAL TUNNEL RELEASE     CORONARY ARTERY BYPASS GRAFT N/A 07/18/2018   Procedure: CORONARY ARTERY BYPASS GRAFTING (CABG) X 5 USING LEFT INTERNAL MAMMARY ARTERY AND RIGHT SAPHENOUS VEIN HARVESTED ENDOSCOPICALLY;  Surgeon: Gaye Pollack, MD;  Location: [place];  Service: Open Heart Surgery;  Laterality: N/A;   LEFT HEART CATH AND CORONARY ANGIOGRAPHY N/A 07/15/2018   Procedure: LEFT HEART CATH AND CORONARY ANGIOGRAPHY;  Surgeon: Jettie Booze, MD;  Location: [place] CV LAB;  Service: Cardiovascular;  Laterality: N/A;   TEE WITHOUT CARDIOVERSION N/A 07/18/2018   Procedure: TRANSESOPHAGEAL ECHOCARDIOGRAM (TEE);  Surgeon: Gaye Pollack, MD;  Location: [place];  Service: Open Heart Surgery;  Laterality: N/A;   VASECTOMY      Current Medications: No outpatient medications have been marked as taking for the 11/27/21 encounter (Appointment) with Richardo Priest, MD.     Allergies:   Patient has no  known allergies.   Social History   Socioeconomic History   Marital status: Divorced    Spouse name: Not on file   Number of children: Not on file   Years of education: Not on file   Highest education level: Not on file  Occupational History   Not on file  Tobacco Use   Smoking status: Former   Smokeless tobacco: Never  Vaping Use   Vaping Use: Never used  Substance and Sexual Activity   Alcohol use: Not on file    Comment: beer occasionally   Drug use: Never   Sexual activity: Not on file  Other Topics Concern   Not on file  Social History Narrative   Not on file   Social Determinants of Health   Financial Resource Strain: Not on file  Food Insecurity: Not on file  Transportation Needs: Not on file  Physical Activity: Not on file  Stress: Not on file  Social Connections: Not on file     Family History: The patient's ***family history includes CAD in his mother; Macular degeneration in his mother; Obesity in his sister; Peripheral vascular disease in his father; Valvular heart disease in his mother. ROS:   Please see the history of present illness.    All other systems reviewed and are negative.  EKGs/Labs/Other Studies Reviewed:    The following studies were reviewed today:  EKG:  EKG ordered today and personally reviewed.  The ekg ordered today demonstrates ***  Recent Labs: 08/11/2021: ALT 15; BUN 15; Creatinine, Ser 1.12; Hemoglobin 15.0; Platelets 163; Potassium 4.8; Sodium 136; TSH 1.910  Recent Lipid Panel    Component Value Date/Time   CHOL 99 (L) 09/24/2018 1426   TRIG 110 09/24/2018 1426   HDL 40 09/24/2018 1426   CHOLHDL 2.5 09/24/2018 1426   CHOLHDL 4.5 07/12/2018 0350   VLDL 74 (H) 07/12/2018 0350   LDLCALC 37 09/24/2018 1426    Physical Exam:    VS:  There were no vitals taken for this visit.    Wt Readings from Last 3 Encounters:  08/11/21 178 lb 9.6 oz (81 kg)  12/29/20 177 lb (80.3 kg)  05/26/20 185 lb (83.9 kg)     GEN: ***  Well nourished, well developed in no acute distress HEENT: Normal NECK: No JVD; No carotid bruits LYMPHATICS: No lymphadenopathy CARDIAC: ***RRR, no murmurs, rubs, gallops RESPIRATORY:  Clear to auscultation without rales, wheezing or rhonchi  ABDOMEN: Soft, non-tender, non-distended MUSCULOSKELETAL:  No edema; No deformity  SKIN: Warm and dry NEUROLOGIC:  Alert and oriented x 3 PSYCHIATRIC:  Normal affect    Signed, Shirlee More, MD  11/26/2021 7:15 PM    [facility]

## 2021-11-27 ENCOUNTER — Ambulatory Visit: Payer: PPO | Admitting: Cardiology

## 2021-12-03 NOTE — Progress Notes (Unsigned)
Cardiology Office Note:    Date:  12/04/2021   ID:  Nicholas Mantis., DOB Jan 10, 1951, MRN [id number]  PCP:  Mateo Flow, MD  Cardiologist:  Shirlee More, MD    Referring MD: Mateo Flow, MD    ASSESSMENT:    1. Ischemic cardiomyopathy   2. CAD in native artery   3. Essential hypertension   4. Hyperlipidemia LDL goal <70   5. RBBB    PLAN:    In order of problems listed above:  From a cardiology perspective cardiomyopathy is doing well his ejection fraction normalized he is on good medical therapy including his SGLT2 inhibitor beta-blocker and ARB continue the same.  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  Concerns regarding medicines are outlined above.  No orders of the defined types were placed in this encounter.  No orders of the defined types were placed in this encounter.   Chief Complaint  Patient presents with   Follow-up   Atrial Fibrillation   Anticoagulation   Cardiomyopathy    History of Present Illness:    Nicholas Gerrard. is a 71 y.o. male with a hx of CAD CABG 07/18/2018 he initially had severe LV dysfunction EF of 28% prior to CABG right bundle branch block brief postoperative atrial fibrillation treated with amiodarone type 2 diabetes hypertension and hyperlipidemia.  Most recent  echocardiogram July 2020 showed EF of 40 to 45% and with elevated proBNP level he was placed on Entresto which unfortunately discontinued due to cost.  He was last seen 11/23/2019 taking valsartan for vasodilator therapy last seen 12/29/2020.  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 Last lipid profile 07/05/2021 LDL 37 cholesterol 115  He had an echocardiogram performed 01/09/2021 showing mild LV dysfunction EF 45 to 50% with global hypokinesia.  Right ventricle size was normal with mild RV systolic dysfunction.  Left atrium is moderately enlarged and there is mild mitral regurgitation.  1. Left ventricular ejection fraction, by estimation, is 45 to 50%. The  left ventricle has mildly decreased function. The left ventricle  demonstrates global hypokinesis. There is mild concentric left ventricular  hypertrophy. Left ventricular diastolic  parameters are consistent with Grade II diastolic dysfunction  (pseudonormalization). Elevated left atrial pressure. The average left  ventricular global longitudinal  strain is -10.4 %. The global longitudinal  strain is abnormal.   2. Right ventricular systolic function is mildly reduced. The right  ventricular size is normal. There is normal pulmonary artery systolic  pressure.   3. Left atrial size was moderately dilated.   4. The mitral valve is normal in structure. Mild mitral valve  regurgitation. No evidence of mitral stenosis.   5. The aortic valve is  tricuspid. Aortic valve regurgitation is not  visualized. Aortic valve sclerosis is present, with no evidence of aortic  valve stenosis.   6. The inferior vena cava is normal in size with greater than 50%  respiratory variability, suggesting right atrial pressure of 3 mmHg.  Past Medical History:  Diagnosis Date   Coronary artery disease    a. 07/15/18 cardiac cath multivessel CAD, CT surgery consulted b.    Diabetes mellitus ([place])    Hiatal hernia    Hx of CABG    a. 07/18/18 LIMA to LAD - SVG to diagonal 2 - SVG to Ramus and OM - SVG to PDA   Hyperlipidemia    Hypertension    PAF (paroxysmal atrial fibrillation) ([place])    a. briefly post op CABG 07/18/18 treated with amiodarone   Right bundle branch block    Syncope     Past Surgical History:  Procedure Laterality Date   CARDIAC CATHETERIZATION     CARPAL TUNNEL RELEASE     CORONARY ARTERY BYPASS GRAFT N/A 07/18/2018   Procedure: CORONARY ARTERY BYPASS GRAFTING (CABG) X 5 USING LEFT INTERNAL MAMMARY ARTERY AND RIGHT SAPHENOUS VEIN HARVESTED ENDOSCOPICALLY;  Surgeon: Gaye Pollack, MD;  Location: [facility];  Service: Open Heart Surgery;  Laterality: N/A;   LEFT HEART CATH AND CORONARY ANGIOGRAPHY N/A 07/15/2018   Procedure: LEFT HEART CATH AND CORONARY ANGIOGRAPHY;  Surgeon: Jettie Booze, MD;  Location: [facility];  Service: Cardiovascular;  Laterality: N/A;   TEE WITHOUT CARDIOVERSION N/A 07/18/2018   Procedure: TRANSESOPHAGEAL ECHOCARDIOGRAM (TEE);  Surgeon: Gaye Pollack, MD;  Location: [facility];  Service: Open Heart Surgery;  Laterality: N/A;   VASECTOMY      Current Medications: Current Meds  Medication Sig   Coenzyme Q10 (COQ10) 100 MG CAPS Take 100 mg by mouth daily.    ELIQUIS 5 MG TABS tablet Take 5 mg by mouth 2 (two) times daily.   JARDIANCE 25 MG TABS tablet Take 25 mg by mouth daily.   metFORMIN (GLUCOPHAGE-XR) 500 MG 24 hr tablet Take 500 mg by mouth 2 (two) times daily.   metoprolol tartrate (LOPRESSOR)  25 MG tablet TAKE 1 TABLET BY MOUTH TWICE (2) DAILY   rosuvastatin (CRESTOR) 10 MG tablet TAKE 1 TABLET BY MOUTH ONCE (1) DAILY   valsartan (DIOVAN) 40 MG tablet Take 1 tablet (40 mg total) by mouth 2 (two) times daily.   zolpidem (AMBIEN) 10 MG tablet Take 10 mg by mouth at bedtime as needed for sleep.     Allergies:   Patient has no known allergies.   Social History   Socioeconomic History   Marital status: Divorced    Spouse name: Not on file   Number of children: Not on file   Years of education: Not on file   Highest education level: Not on file  Occupational History   Not on file  Tobacco Use   Smoking status: Former    Passive exposure: Past   Smokeless tobacco: Never  Vaping Use   Vaping Use: Never used  Substance and Sexual Activity   Alcohol use: Not on file    Comment: beer occasionally   Drug use: Never   Sexual activity: Not on file  Other Topics Concern   Not on file  Social History Narrative   Not on file   Social Determinants of Health   Financial Resource Strain: Not on file  Food Insecurity: Not on file  Transportation Needs: Not on file  Physical Activity: Not on file  Stress: Not on file  Social Connections: Not on file     Family History: The patient's family history includes CAD in his mother; Macular degeneration in his mother; Obesity in his sister; Peripheral vascular disease in his father; Valvular heart disease in his mother. ROS:   Please see the history of present illness.    All other systems reviewed and are negative.  EKGs/Labs/Other Studies Reviewed:    The following studies were reviewed today:    Recent Labs: 08/11/2021: ALT 15; BUN 15; Creatinine, Ser 1.12; Hemoglobin 15.0; Platelets 163; Potassium 4.8; Sodium 136; TSH 1.910  Recent Lipid Panel    Component Value Date/Time   CHOL 99 (L) 09/24/2018 1426   TRIG 110 09/24/2018 1426   HDL 40 09/24/2018 1426   CHOLHDL 2.5 09/24/2018 1426   CHOLHDL 4.5 07/12/2018 0350    VLDL 74 (H) 07/12/2018 0350   LDLCALC 37 09/24/2018 1426    Physical Exam:    VS:  BP 112/64 (BP Location: Left Arm, Patient Position: Sitting)   Pulse (!) 104   Ht 5\' 10"  (1.778 m)   Wt 177 lb 12.8 oz (80.6 kg)   SpO2 96%   BMI 25.51 kg/m     Wt Readings from Last 3 Encounters:  12/04/21 177 lb 12.8 oz (80.6 kg)  08/11/21 178 lb 9.6 oz (81 kg)  12/29/20 177 lb (80.3 kg)     GEN:  Well nourished, well developed in no acute distress HEENT: Normal NECK: No JVD; No carotid bruits LYMPHATICS: No lymphadenopathy CARDIAC: RRR, no murmurs, rubs, gallops RESPIRATORY:  Clear to auscultation without rales, wheezing or rhonchi  ABDOMEN: Soft, non-tender, non-distended MUSCULOSKELETAL:  No edema; No deformity  SKIN: Warm and dry NEUROLOGIC:  Alert and oriented x 3 PSYCHIATRIC:  Normal affect    Signed, Shirlee More, MD  12/04/2021 9:08 AM    [place]

## 2021-12-04 ENCOUNTER — Ambulatory Visit: Payer: PPO | Attending: Cardiology | Admitting: Cardiology

## 2021-12-04 ENCOUNTER — Encounter: Payer: Self-pay | Admitting: Cardiology

## 2021-12-04 VITALS — BP 112/64 | HR 104 | Ht 70.0 in | Wt 177.8 lb

## 2021-12-04 DIAGNOSIS — I10 Essential (primary) hypertension: Secondary | ICD-10-CM | POA: Diagnosis not present

## 2021-12-04 DIAGNOSIS — I2510 Atherosclerotic heart disease of native coronary artery without angina pectoris: Secondary | ICD-10-CM

## 2021-12-04 DIAGNOSIS — I4510 Unspecified right bundle-branch block: Secondary | ICD-10-CM | POA: Diagnosis not present

## 2021-12-04 DIAGNOSIS — I255 Ischemic cardiomyopathy: Secondary | ICD-10-CM | POA: Diagnosis not present

## 2021-12-04 DIAGNOSIS — E785 Hyperlipidemia, unspecified: Secondary | ICD-10-CM | POA: Diagnosis not present

## 2021-12-04 NOTE — Patient Instructions (Signed)
[facility]  Tips to measure your blood pressure correctly  To determine whether you have hypertension, a medical professional will take a blood pressure reading. How you prepare for the test, the position of your arm, and other factors can change a blood pressure reading by 10% or more. That could be enough to hide high blood pressure, start you on a drug you don't really need, or lead your doctor to incorrectly adjust your medications. National and international guidelines offer specific instructions for measuring blood pressure. If a doctor, nurse, or medical assistant isn't doing it right, don't hesitate to ask him or her to get with the guidelines. Here's what you can do to ensure a correct reading:  Don't drink a caffeinated beverage or smoke during the 30 minutes before the test.  Sit quietly for five minutes before the test begins.  During the measurement, sit in a chair with your feet on the floor and your arm supported so your elbow is at about heart level.  The inflatable part of the cuff should completely cover at least 80% of your upper arm, and the cuff should be placed on bare skin, not over a shirt.  Don't talk during the measurement.  Have your blood pressure measured twice, with a brief break in between. If the readings are different by 5 points or more, have it done a third time. There are times to break these rules. If you sometimes feel lightheaded when getting out of bed in the morning or when you stand after sitting, you should have your blood pressure checked while seated and then while standing to see if it falls from one position to the next. Because blood pressure varies throughout the day, your doctor will rarely diagnose hypertension on the basis of a single reading. Instead, he or she will want to confirm the measurements on at least two occasions, usually within a few weeks of one another. The exception to this rule is if you have a blood pressure reading of 180/110 mm Hg  or higher. A result this high usually calls for prompt treatment. It's also a good idea to have your blood pressure measured in both arms at least once, since the reading in one arm (usually the right) may be higher than that in the left. A 2014 study in The American Journal of Medicine of nearly 3,400 people found average arm- to-arm differences in systolic blood pressure of about 5 points. The higher number should be used to make treatment decisions. In 2017, new guidelines from the [place], the [employer] of Cardiology, and nine other health organizations lowered the diagnosis of high blood pressure to 130/80 mm Hg or higher for all adults. The guidelines also redefined the various blood pressure categories to now include normal, elevated, Stage 1 hypertension, Stage 2 hypertension, and hypertensive crisis (see "Blood pressure categories"). Blood pressure categories  Blood pressure category SYSTOLIC (upper number)  DIASTOLIC (lower number)  Normal Less than 120 mm Hg and Less than 80 mm Hg  Elevated 120-129 mm Hg and Less than 80 mm Hg  High blood pressure: Stage 1 hypertension 130-139 mm Hg or 80-89 mm Hg  High blood pressure: Stage 2 hypertension 140 mm Hg or higher or 90 mm Hg or higher  Hypertensive crisis (consult your doctor immediately) Higher than 180 mm Hg and/or Higher than 120 mm Hg  Source: American Heart Association and American Stroke Association. For more on getting your blood pressure under control, buy Controlling Your Blood  Pressure, a Special Health Report from [facility]. Medication Instructions:  Your physician has recommended you make the following change in your medication:  Discontinue Rosuvastatin, Let Dr. Bettina Gavia know how you are feeling after you stop this medication.  *If you need a refill on your cardiac medications before your next appointment, please call your pharmacy*   Lab Work: NONE If you have labs (blood work) drawn  today and your tests are completely normal, you will receive your results only by: [place] (if you have MyChart) OR A paper copy in the mail If you have any lab test that is abnormal or we need to change your treatment, we will call you to review the results.   Testing/Procedures: NONE   Follow-Up: At [facility], you and your health needs are our priority.  As part of our continuing mission to provide you with exceptional heart care, we have created designated Provider Care Teams.  These Care Teams include your primary Cardiologist (physician) and Advanced Practice Providers (APPs -  Physician Assistants and Nurse Practitioners) who all work together to provide you with the care you need, when you need it.  We recommend signing up for the patient portal called "MyChart".  Sign up information is provided on this After Visit Summary.  MyChart is used to connect with patients for Virtual Visits (Telemedicine).  Patients are able to view lab/test results, encounter notes, upcoming appointments, etc.  Non-urgent messages can be sent to your provider as well.   To learn more about what you can do with MyChart, go to [email or web address].    Your next appointment:   6 month(s)  The format for your next appointment:   In Person  Provider:   Shirlee More, MD    Other Instructions Get a Smart Watch: Apple or Fit Bit  Important Information About Sugar

## 2021-12-09 DIAGNOSIS — M7731 Calcaneal spur, right foot: Secondary | ICD-10-CM | POA: Diagnosis not present

## 2021-12-09 DIAGNOSIS — M79673 Pain in unspecified foot: Secondary | ICD-10-CM | POA: Diagnosis not present

## 2021-12-09 DIAGNOSIS — S92511A Displaced fracture of proximal phalanx of right lesser toe(s), initial encounter for closed fracture: Secondary | ICD-10-CM | POA: Diagnosis not present

## 2021-12-09 DIAGNOSIS — M25571 Pain in right ankle and joints of right foot: Secondary | ICD-10-CM | POA: Diagnosis not present

## 2021-12-09 DIAGNOSIS — M25579 Pain in unspecified ankle and joints of unspecified foot: Secondary | ICD-10-CM | POA: Diagnosis not present

## 2021-12-09 DIAGNOSIS — S90414A Abrasion, right lesser toe(s), initial encounter: Secondary | ICD-10-CM | POA: Diagnosis not present

## 2021-12-09 DIAGNOSIS — S92514A Nondisplaced fracture of proximal phalanx of right lesser toe(s), initial encounter for closed fracture: Secondary | ICD-10-CM | POA: Diagnosis not present

## 2022-01-23 DIAGNOSIS — Z6824 Body mass index (BMI) 24.0-24.9, adult: Secondary | ICD-10-CM | POA: Diagnosis not present

## 2022-01-23 DIAGNOSIS — E782 Mixed hyperlipidemia: Secondary | ICD-10-CM | POA: Diagnosis not present

## 2022-01-23 DIAGNOSIS — R202 Paresthesia of skin: Secondary | ICD-10-CM | POA: Diagnosis not present

## 2022-01-24 ENCOUNTER — Ambulatory Visit: Payer: PPO | Admitting: Cardiology

## 2022-02-09 ENCOUNTER — Other Ambulatory Visit: Payer: Self-pay

## 2022-02-09 MED ORDER — VALSARTAN 40 MG PO TABS: 40.0000 mg | ORAL_TABLET | Freq: Two times a day (BID) | ORAL | 3 refills | Status: DC

## 2022-02-09 NOTE — Telephone Encounter (Signed)
Valsartan 40 mg # 180 x 3 refills sent to [facility]

## 2022-02-20 ENCOUNTER — Other Ambulatory Visit: Payer: Self-pay | Admitting: Cardiology

## 2022-02-20 NOTE — Telephone Encounter (Signed)
Metoprolol tartrate 25 mg # 180 x 4 refills sent to    [place], [place]

## 2022-03-01 DIAGNOSIS — Z6825 Body mass index (BMI) 25.0-25.9, adult: Secondary | ICD-10-CM | POA: Diagnosis not present

## 2022-03-01 DIAGNOSIS — N529 Male erectile dysfunction, unspecified: Secondary | ICD-10-CM | POA: Diagnosis not present

## 2022-06-08 DIAGNOSIS — R0600 Dyspnea, unspecified: Secondary | ICD-10-CM | POA: Diagnosis not present

## 2022-06-08 DIAGNOSIS — I480 Paroxysmal atrial fibrillation: Secondary | ICD-10-CM | POA: Diagnosis not present

## 2022-06-08 DIAGNOSIS — Z6825 Body mass index (BMI) 25.0-25.9, adult: Secondary | ICD-10-CM | POA: Diagnosis not present

## 2022-06-11 DIAGNOSIS — Z6825 Body mass index (BMI) 25.0-25.9, adult: Secondary | ICD-10-CM | POA: Diagnosis not present

## 2022-06-11 DIAGNOSIS — Z1339 Encounter for screening examination for other mental health and behavioral disorders: Secondary | ICD-10-CM | POA: Diagnosis not present

## 2022-06-11 DIAGNOSIS — I480 Paroxysmal atrial fibrillation: Secondary | ICD-10-CM | POA: Diagnosis not present

## 2022-06-11 DIAGNOSIS — Z1331 Encounter for screening for depression: Secondary | ICD-10-CM | POA: Diagnosis not present

## 2022-06-13 ENCOUNTER — Encounter: Payer: Self-pay | Admitting: Cardiology

## 2022-06-13 ENCOUNTER — Encounter: Payer: Self-pay | Admitting: *Deleted

## 2022-06-13 NOTE — Progress Notes (Signed)
Cardiology Office Note:    Date:  06/14/2022   ID:  Nicholas Solian., DOB 12-11-1950, MRN [id number]  PCP:  Lise Auer, MD   [facility] Providers Cardiologist:  Norman Herrlich, MD     Referring MD: Lise Auer, MD   CC: follow up for tachycardia  History of Present Illness:    Nicholas Eze. is a 72 y.o. male with a hx of CAD s/p CABG x 5, RBBB, congestive heart failure, paroxysmal atrial fibrillation, hypertension, DM2, CKD, hyperlipidemia, carotid artery stenosis.  On 07/10/2018 he was evaluated at [facility] for recent syncope and collapse while he was working outside, was passed out for approximately an hour.  Hindsight he recalls increasing chest pain over the previous week.  He had a [employer] study, which showed an EF of 28% with global hypokinesis, small fixed defect noted in the anterior septal wall, ischemia noted in the anterior septal region and additional ischemia noted in inferior lateral wall mid to base.  Echo on 07/12/2018 revealed an EF of 40 to 45%, mild hypokinesis to the left ventricular entire inferolateral wall, biatrial mild dilatation, mild to moderate TR, mild thickening of the AV.  LHC on 07/15/2018 revealed ostial LM lesion 80% stenosed, proximal circumflex lesion 80% stenosed, proximal RCA lesion 100% stenosis with left-to-right collaterals, second diagonal lesion 90% stenosis.  Carotid ultrasound at this time revealed right ICA with 1 to 39% stenosis, left ICA 40 to 49% stenosis.  07/18/2018 he underwent CABG x 5 LIMA to LAD, SVG to PDA, SVG to Diagonal, and sequential SVG to Ramus Intermediate and OM.  On 07/21/2018 he went into A-fib with RVR, given amiodarone bolus, IV Lopressor, he eventually converted to sinus rhythm.  He was evaluated by his PCP in May 2023 and found to be in atrial flutter, he was started on Eliquis with a CHA2DS2-VASc score of 5 and advised to follow-up with [facility].  He was evaluated by Lucile Crater, PA on  08/11/2021, and was doing well overall from a cardiac perspective and back in sinus rhythm. He wore a monitor in July 2023, which predominantly showed sinus rhythm, first-degree AV block, BBB was present, 8 runs of SVT the fastest interval lasting 5 beats with a rate 154 bpm.  Isolated SVE's were occasional, SVE couplets were rare, no episodes of atrial fibrillation or flutter. Echo completed on 09/11/2021 revealed an EF of 40 to 45%, mild concentric LVH, grade 3 DD, moderately elevated PASP, mild MR, mild TR.  Previously, unable to afford Entresto, MRA was not started due to potassium at the upper limits of normal.  Most recently he was evaluated by Dr. Dulce Sellar on 12/04/2021, he was doing well related to his cardiomyopathy.  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  He was evaluated by his PCP last week for URI/allergy type symptoms.  Noted to be tachycardic, heart rate 128 bpm, EKG reviewed from their office revealed ST, LPFB, RBBB. Labs were checked, TSH, BNP, BMET normal. CBC revealed hemoglobin 12.9, hematocrit 38.  His metoprolol was increased to 50 mg daily.  He endorses occasional palpitations since then.  He continues to be very active, working in his yard, able to cut trees with a chainsaw.  Denies chest pain, dyspnea, pnd, orthopnea, n, v, dizziness, syncope, edema, weight gain, or early satiety.   Past Medical History:  Diagnosis Date   Acute systolic CHF (congestive heart failure)    CAD (coronary artery  disease) 08/04/2018   Cardiomyopathy 07/11/2018   Coronary artery disease    a. 07/15/18 cardiac cath multivessel CAD, CT surgery consulted b.    Diabetes mellitus    Hiatal hernia    Hx of CABG    a. 07/18/18 LIMA to LAD - SVG to diagonal 2 - SVG to Ramus and OM - SVG to PDA   Hyperlipidemia    Hypertension    NSTEMI (non-ST elevated myocardial infarction) 07/11/2018   PAF (paroxysmal atrial fibrillation)    a. briefly post op CABG 07/18/18  treated with amiodarone   Right bundle branch block    Syncope    Syncope and collapse 07/11/2018    Past Surgical History:  Procedure Laterality Date   CARDIAC CATHETERIZATION     CARPAL TUNNEL RELEASE     CORONARY ARTERY BYPASS GRAFT N/A 07/18/2018   Procedure: CORONARY ARTERY BYPASS GRAFTING (CABG) X 5 USING LEFT INTERNAL MAMMARY ARTERY AND RIGHT SAPHENOUS VEIN HARVESTED ENDOSCOPICALLY;  Surgeon: Alleen Borne, MD;  Location: [facility] OR;  Service: Open Heart Surgery;  Laterality: N/A;   LEFT HEART CATH AND CORONARY ANGIOGRAPHY N/A 07/15/2018   Procedure: LEFT HEART CATH AND CORONARY ANGIOGRAPHY;  Surgeon: Corky Crafts, MD;  Location: [facility] INVASIVE CV LAB;  Service: Cardiovascular;  Laterality: N/A;   TEE WITHOUT CARDIOVERSION N/A 07/18/2018   Procedure: TRANSESOPHAGEAL ECHOCARDIOGRAM (TEE);  Surgeon: Alleen Borne, MD;  Location: [facility] OR;  Service: Open Heart Surgery;  Laterality: N/A;   VASECTOMY      Current Medications: Current Meds  Medication Sig   Coenzyme Q10 (COQ10) 100 MG CAPS Take 100 mg by mouth daily.    ELIQUIS 5 MG TABS tablet Take 5 mg by mouth 2 (two) times daily.   gabapentin (NEURONTIN) 100 MG capsule Take 100-200 mg by mouth at bedtime as needed (pain).   JARDIANCE 25 MG TABS tablet Take 25 mg by mouth daily.   levocetirizine (XYZAL) 5 MG tablet Take 5 mg by mouth daily as needed for allergies.   metoprolol tartrate (LOPRESSOR) 50 MG tablet Take 50 mg by mouth 2 (two) times daily.   predniSONE (DELTASONE) 20 MG tablet Take 40 mg by mouth daily with breakfast. For 5 days, started 06/11/22   tadalafil (CIALIS) 20 MG tablet Take 20 mg by mouth daily as needed for erectile dysfunction.   traMADol (ULTRAM) 50 MG tablet Take 50 mg by mouth daily as needed for moderate pain.   valsartan (DIOVAN) 40 MG tablet Take 1 tablet (40 mg total) by mouth 2 (two) times daily.   zolpidem (AMBIEN) 10 MG tablet Take 10 mg by mouth at bedtime as needed for sleep.     Allergies:    Patient has no known allergies.   Social History   Socioeconomic History   Marital status: Divorced    Spouse name: Not on file   Number of children: Not on file   Years of education: Not on file   Highest education level: Not on file  Occupational History   Not on file  Tobacco Use   Smoking status: Former    Passive exposure: Past   Smokeless tobacco: Never  Vaping Use   Vaping Use: Never used  Substance and Sexual Activity   Alcohol use: Yes    Comment: beer occasionally   Drug use: Never   Sexual activity: Not on file  Other Topics Concern   Not on file  Social History Narrative   Not on file   Social Determinants  of Health   Financial Resource Strain: Not on file  Food Insecurity: Not on file  Transportation Needs: Not on file  Physical Activity: Not on file  Stress: Not on file  Social Connections: Not on file     Family History: The patient's family history includes CAD in his mother; Macular degeneration in his mother; Obesity in his sister; Peripheral vascular disease in his father; Valvular heart disease in his mother.  ROS:   Please see the history of present illness.     All other systems reviewed and are negative.  EKGs/Labs/Other Studies Reviewed:    The following studies were reviewed today: Cardiac Studies & Procedures   CARDIAC CATHETERIZATION  CARDIAC CATHETERIZATION 07/15/2018  Narrative  Ost LM lesion is 80% stenosed.  Prox Cx lesion is 80% stenosed.  Prox RCA lesion is 100% stenosed. Left to right collaterals.  Prox LAD to Mid LAD lesion is 25% stenosed.  2nd Diag lesion is 90% stenosed.  LV end diastolic pressure is normal.  There is no aortic valve stenosis.  Plan for surgery consult.  Move to stepdown.  Findings Coronary Findings Diagnostic  Dominance: Right  Left Main Ost LM lesion is 80% stenosed. The lesion is eccentric, irregular and ulcerative.  Left Anterior Descending Prox LAD to Mid LAD lesion is 25%  stenosed.  Second Diagonal Branch 2nd Diag lesion is 90% stenosed.  Left Circumflex Prox Cx lesion is 80% stenosed.  Right Coronary Artery Prox RCA lesion is 100% stenosed.  Right Posterior Descending Artery Collaterals RPDA filled by collaterals from 3rd Sept.  Intervention  No interventions have been documented.   STRESS TESTS  MYOCARDIAL PERFUSION IMAGING 07/24/2018   ECHOCARDIOGRAM  ECHOCARDIOGRAM COMPLETE 09/11/2021  Narrative ECHOCARDIOGRAM REPORT    Patient Name:   Nicholas Classon. Date of Exam: 09/11/2021 Medical Rec #:  [id number]         Height:       70.0 in Accession #:    [id number]        Weight:       178.6 lb Date of Birth:  April 21, 1950         BSA:          1.989 m Patient Age:    71 years          BP:           118/62 mmHg Patient Gender: M                 HR:           83 bpm. Exam Location:  [place]  Procedure: 2D Echo, Cardiac Doppler, Color Doppler, Strain Analysis and Intracardiac Opacification Agent  Indications:    Cardiomyopathy-Ischemic I25.5  History:        Patient has prior history of Echocardiogram examinations, most recent 01/09/2021. Arrythmias:Atrial Fibrillation and RBBB; Risk Factors:Dyslipidemia and Hypertension.  Sonographer:    Louie Boston RDCS Referring Phys: [phone] [facility] N DUNN   Sonographer Comments: Suboptimal apical window. IMPRESSIONS   1. Left ventricular ejection fraction, by estimation, is 40 to 45%. The left ventricle has mildly decreased function. The left ventricle demonstrates global hypokinesis. There is mild concentric left ventricular hypertrophy. Left ventricular diastolic parameters are consistent with Grade III diastolic dysfunction (restrictive). Elevated left atrial pressure. 2. Right ventricular systolic function is mildly reduced. The right ventricular size is normal. There is moderately elevated pulmonary artery systolic pressure. 3. Right atrial size was moderately dilated. 4. The mitral valve is  normal  in structure. Mild mitral valve regurgitation. No evidence of mitral stenosis. 5. The aortic valve is tricuspid. Aortic valve regurgitation is not visualized. Aortic valve sclerosis is present, with no evidence of aortic valve stenosis. 6. Aortic Normal DTA. 7. The inferior vena cava is normal in size with greater than 50% respiratory variability, suggesting right atrial pressure of 3 mmHg.  FINDINGS Left Ventricle: Left ventricular ejection fraction, by estimation, is 40 to 45%. The left ventricle has mildly decreased function. The left ventricle demonstrates global hypokinesis. Definity contrast agent was given IV to delineate the left ventricular endocardial borders. The left ventricular internal cavity size was normal in size. There is mild concentric left ventricular hypertrophy. Left ventricular diastolic parameters are consistent with Grade III diastolic dysfunction (restrictive). Elevated left atrial pressure.  Right Ventricle: The right ventricular size is normal. No increase in right ventricular wall thickness. Right ventricular systolic function is mildly reduced. There is moderately elevated pulmonary artery systolic pressure. The tricuspid regurgitant velocity is 3.58 m/s, and with an assumed right atrial pressure of 3 mmHg, the estimated right ventricular systolic pressure is 54.3 mmHg.  Left Atrium: Left atrial size was normal in size.  Right Atrium: Right atrial size was moderately dilated.  Pericardium: There is no evidence of pericardial effusion.  Mitral Valve: The mitral valve is normal in structure. Mild mitral valve regurgitation. No evidence of mitral valve stenosis.  Tricuspid Valve: The tricuspid valve is normal in structure. Tricuspid valve regurgitation is mild . No evidence of tricuspid stenosis.  Aortic Valve: The aortic valve is tricuspid. Aortic valve regurgitation is not visualized. Aortic valve sclerosis is present, with no evidence of aortic valve  stenosis.  Pulmonic Valve: The pulmonic valve was normal in structure. Pulmonic valve regurgitation is not visualized. No evidence of pulmonic stenosis.  Aorta: The aortic root and ascending aorta are structurally normal, with no evidence of dilitation, the aortic arch was not well visualized and Normal DTA.  Venous: The pulmonary veins were not well visualized. The inferior vena cava is normal in size with greater than 50% respiratory variability, suggesting right atrial pressure of 3 mmHg.  IAS/Shunts: No atrial level shunt detected by color flow Doppler.   LEFT VENTRICLE PLAX 2D LVIDd:         4.80 cm     Diastology LV PW:         1.20 cm     LV e' medial:    3.65 cm/s LV IVS:        1.20 cm     LV E/e' medial:  28.5 LVOT diam:     2.00 cm     LV e' lateral:   6.89 cm/s LV SV:         48          LV E/e' lateral: 15.1 LV SV Index:   24 LVOT Area:     3.14 cm  LV Volumes (MOD) LV vol d, MOD A2C: 58.5 ml LV vol d, MOD A4C: 72.9 ml LV vol s, MOD A2C: 33.8 ml LV vol s, MOD A4C: 32.7 ml LV SV MOD A2C:     24.7 ml LV SV MOD A4C:     72.9 ml LV SV MOD BP:      31.6 ml  RIGHT VENTRICLE RV S prime:     6.41 cm/s TAPSE (M-mode): 1.6 cm  LEFT ATRIUM             Index  RIGHT ATRIUM           Index LA diam:        3.20 cm 1.61 cm/m   RA Area:     19.50 cm LA Vol (A2C):   73.9 ml 37.15 ml/m  RA Volume:   51.40 ml  25.84 ml/m LA Vol (A4C):   80.2 ml 40.32 ml/m LA Biplane Vol: 84.1 ml 42.28 ml/m AORTIC VALVE LVOT Vmax:   73.20 cm/s LVOT Vmean:  51.900 cm/s LVOT VTI:    0.153 m  AORTA Ao Root diam: 3.70 cm Ao Asc diam:  3.50 cm Ao Desc diam: 1.90 cm  MITRAL VALVE                  TRICUSPID VALVE MV Area (PHT): 5.54 cm       TR Peak grad:   51.3 mmHg MV Decel Time: 137 msec       TR Vmax:        358.00 cm/s MR Peak grad:    86.5 mmHg MR Mean grad:    61.0 mmHg    SHUNTS MR Vmax:         465.00 cm/s  Systemic VTI:  0.15 m MR Vmean:        368.0 cm/s   Systemic  Diam: 2.00 cm MR PISA:         2.26 cm MR PISA Eff ROA: 19 mm MR PISA Radius:  0.60 cm MV E velocity: 104.00 cm/s MV A velocity: 32.50 cm/s MV E/A ratio:  3.20  Norman Herrlich MD Electronically signed by Norman Herrlich MD Signature Date/Time: 09/11/2021/4:51:54 PM    Final   TEE  ECHO INTRAOPERATIVE TEE 07/18/2018  Interpretation Summary   Left ventricle: Normal cavity size and wall thickness. Wall motion is abnormal.   Left atrium: Cavity is dilated and dilated.   Aortic valve: No stenosis.   Mitral valve:  Mild to moderate regurgitation.   Right ventricle:  Normal cavity size, wall thickness and ejection fraction.   Right atrium:  Cavity is dilated.   Tricuspid valve: Mild regurgitation.   MONITORS  LONG TERM MONITOR (3-14 DAYS) 08/30/2021  Narrative Patch Wear Time:  6 days and 9 hours (2023-06-27T08:34:16-0400 to 2023-07-03T18:13:14-0400)  Patient had a min HR of 57 bpm, max HR of 154 bpm, and avg HR of 77 bpm. Predominant underlying rhythm was Sinus Rhythm. First Degree AV Block was present. Bundle Branch Block/IVCD was present.  8 Supraventricular Tachycardia runs occurred, the run with the fastest interval lasting 5 beats with a max rate of 154 bpm, the longest lasting 8 beats with an avg rate of 101 bpm.  Isolated SVEs were occasional (1.6%, 11055), SVE Couplets were rare (<1.0%, 3033), and no SVE Triplets were present. There were no episodes of atrial fibrillation or flutter.  There were no episodes of sinus pauses secondary third-degree AV block or sinus node exit block.  Isolated VEs were rare (<1.0%), VE Couplets were rare (<1.0%), and no VE Triplets were present.  There is 1 single triggered event sinus rhythm            EKG:  EKG is not ordered today.    Recent Labs: 08/11/2021: ALT 15; BUN 15; Creatinine, Ser 1.12; Hemoglobin 15.0; Platelets 163; Potassium 4.8; Sodium 136; TSH 1.910  Recent Lipid Panel    Component Value Date/Time   CHOL 99 (L)  09/24/2018 1426   TRIG 110 09/24/2018 1426   HDL 40 09/24/2018 1426   CHOLHDL  2.5 09/24/2018 1426   CHOLHDL 4.5 07/12/2018 0350   VLDL 74 (H) 07/12/2018 0350   LDLCALC 37 09/24/2018 1426     Risk Assessment/Calculations:    CHA2DS2-VASc Score = 5   This indicates a 7.2% annual risk of stroke. The patient's score is based upon: CHF History: 1 HTN History: 1 Diabetes History: 1 Stroke History: 0 Vascular Disease History: 1 Age Score: 1 Gender Score: 0               Physical Exam:    VS:  BP 120/88 (BP Location: Left Arm, Patient Position: Sitting, Cuff Size: Normal)   Pulse 84   Ht 5\' 10"  (1.778 m)   Wt 177 lb (80.3 kg)   SpO2 92%   BMI 25.40 kg/m     Wt Readings from Last 3 Encounters:  06/14/22 177 lb (80.3 kg)  06/08/22 176 lb (79.8 kg)  12/04/21 177 lb 12.8 oz (80.6 kg)     GEN:  Well nourished, well developed in no acute distress HEENT: Normal NECK: No JVD; No carotid bruits LYMPHATICS: No lymphadenopathy CARDIAC: RRR, no murmurs, rubs, gallops RESPIRATORY:  Clear to auscultation without rales, wheezing or rhonchi  ABDOMEN: Soft, non-tender, non-distended MUSCULOSKELETAL:  trace pedal edema; No deformity  SKIN: Warm and dry NEUROLOGIC:  Alert and oriented x 3 PSYCHIATRIC:  Normal affect   ASSESSMENT:    1. CAD in native artery   2. Atrial flutter, unspecified type   3. PAF (paroxysmal atrial fibrillation)   4. Chronic combined systolic and diastolic CHF (congestive heart failure)   5. Ischemic cardiomyopathy   6. RBBB (right bundle branch block)   7. Hyperlipidemia LDL goal <70   8. Essential hypertension   9. Hypercoagulable state   10. Palpitations    PLAN:    In order of problems listed above:  CAD-CABG x 5 in 2020, Stable with no anginal symptoms. No indication for ischemic evaluation.  Continue metoprolol 50 mg twice daily.  Paroxysmal atrial fibrillation/atrial flutter/hypercoagulable state-CHA2DS2-VASc or 5, continue Eliquis 5 mg  twice daily--no indication for dose reduction.  Continue metoprolol 50 mg twice daily.  Denies hematochezia, hematuria, hemoptysis.  Palpitations/tachycardia-recently noted at PCP visit last week, he was asymptomatic although does endorse some palpitations.  Labs at PCP were overall unrevealing for causes, although he does have some new anemia however denies any bleeding.  Will wear a ZIO monitor for 14 days to assess for tachyarrhythmias, A-fib, a flutter. Ischemic cardiomyopathy/combined heart failure-NYHA class I, euvolemic.  Echo on 09/11/2021 revealed an EF of 40 to 45%, continue metoprolol 50 mg twice daily, Jardiance 25 mg daily--although DM dosing, valsartan 40 mg twice daily. Hyperlipidemia-has a history of statin induced myopathy, not clear who is monitoring his lipids currently.  Will evaluate at next visit. Hypertension-blood pressure is well-controlled today at 120/88, continue metoprolol 50 mg twice a day, continue valsartan 40 mg twice daily. Anemia- hemoglobin 12.9, hematocrit 38 a week ago; in June 2023 his hemoglobin was 15 and hematocrit 43.  He denies hematochezia, hematuria, hemoptysis.  Will discuss at next visit if he has had a recent GI evaluation/colonoscopy.  Disposition-ZIO monitor x 2 weeks, return in 6 weeks.         Medication Adjustments/Labs and Tests Ordered: Current medicines are reviewed at length with the patient today.  Concerns regarding medicines are outlined above.  Orders Placed This Encounter  Procedures   LONG TERM MONITOR (3-14 DAYS)   No orders of the defined types were placed  in this encounter.   Patient Instructions  Medication Instructions:  Your physician recommends that you continue on your current medications as directed. Please refer to the Current Medication list given to you today.  *If you need a refill on your cardiac medications before your next appointment, please call your pharmacy*   Lab Work: NONE If you have labs (blood work)  drawn today and your tests are completely normal, you will receive your results only by: MyChart Message (if you have MyChart) OR A paper copy in the mail If you have any lab test that is abnormal or we need to change your treatment, we will call you to review the results.   Testing/Procedures: You have been asked to wear a Zio Heart Monitor today. It is to be worn for 14 days. Please remove the monitor on 5/9   and mail back in the box provided.  If you have any questions about the monitor please call the company at [phone]     Follow-Up: At [facility], you and your health needs are our priority.  As part of our continuing mission to provide you with exceptional heart care, we have created designated Provider Care Teams.  These Care Teams include your primary Cardiologist (physician) and Advanced Practice Providers (APPs -  Physician Assistants and Nurse Practitioners) who all work together to provide you with the care you need, when you need it.  We recommend signing up for the patient portal called "MyChart".  Sign up information is provided on this After Visit Summary.  MyChart is used to connect with patients for Virtual Visits (Telemedicine).  Patients are able to view lab/test results, encounter notes, upcoming appointments, etc.  Non-urgent messages can be sent to your provider as well.   To learn more about what you can do with MyChart, go to [email or web address].    Your next appointment:   6 week(s)  Provider:   Norman Herrlich, MD    Other Instructions     Signed, [name], NP  06/14/2022 12:25 PM    [facility]

## 2022-06-14 ENCOUNTER — Encounter: Payer: Self-pay | Admitting: Cardiology

## 2022-06-14 ENCOUNTER — Ambulatory Visit: Payer: PPO | Attending: Cardiology | Admitting: Cardiology

## 2022-06-14 ENCOUNTER — Ambulatory Visit: Payer: PPO | Attending: Cardiology

## 2022-06-14 VITALS — BP 120/88 | HR 84 | Ht 70.0 in | Wt 177.0 lb

## 2022-06-14 DIAGNOSIS — I255 Ischemic cardiomyopathy: Secondary | ICD-10-CM

## 2022-06-14 DIAGNOSIS — I480 Paroxysmal atrial fibrillation: Secondary | ICD-10-CM

## 2022-06-14 DIAGNOSIS — I4892 Unspecified atrial flutter: Secondary | ICD-10-CM

## 2022-06-14 DIAGNOSIS — D6859 Other primary thrombophilia: Secondary | ICD-10-CM

## 2022-06-14 DIAGNOSIS — I5042 Chronic combined systolic (congestive) and diastolic (congestive) heart failure: Secondary | ICD-10-CM

## 2022-06-14 DIAGNOSIS — I4510 Unspecified right bundle-branch block: Secondary | ICD-10-CM

## 2022-06-14 DIAGNOSIS — D649 Anemia, unspecified: Secondary | ICD-10-CM

## 2022-06-14 DIAGNOSIS — I10 Essential (primary) hypertension: Secondary | ICD-10-CM

## 2022-06-14 DIAGNOSIS — I2510 Atherosclerotic heart disease of native coronary artery without angina pectoris: Secondary | ICD-10-CM | POA: Diagnosis not present

## 2022-06-14 DIAGNOSIS — E785 Hyperlipidemia, unspecified: Secondary | ICD-10-CM

## 2022-06-14 DIAGNOSIS — R002 Palpitations: Secondary | ICD-10-CM

## 2022-06-14 NOTE — Patient Instructions (Signed)
Medication Instructions:  Your physician recommends that you continue on your current medications as directed. Please refer to the Current Medication list given to you today.  *If you need a refill on your cardiac medications before your next appointment, please call your pharmacy*   Lab Work: NONE If you have labs (blood work) drawn today and your tests are completely normal, you will receive your results only by: MyChart Message (if you have MyChart) OR A paper copy in the mail If you have any lab test that is abnormal or we need to change your treatment, we will call you to review the results.   Testing/Procedures: You have been asked to wear a Zio Heart Monitor today. It is to be worn for 14 days. Please remove the monitor on 5/9   and mail back in the box provided.  If you have any questions about the monitor please call the company at [phone]     Follow-Up: At [facility], you and your health needs are our priority.  As part of our continuing mission to provide you with exceptional heart care, we have created designated Provider Care Teams.  These Care Teams include your primary Cardiologist (physician) and Advanced Practice Providers (APPs -  Physician Assistants and Nurse Practitioners) who all work together to provide you with the care you need, when you need it.  We recommend signing up for the patient portal called "MyChart".  Sign up information is provided on this After Visit Summary.  MyChart is used to connect with patients for Virtual Visits (Telemedicine).  Patients are able to view lab/test results, encounter notes, upcoming appointments, etc.  Non-urgent messages can be sent to your provider as well.   To learn more about what you can do with MyChart, go to [email or web address].    Your next appointment:   6 week(s)  Provider:   Norman Herrlich, MD    Other Instructions

## 2022-06-19 DIAGNOSIS — G720 Drug-induced myopathy: Secondary | ICD-10-CM | POA: Diagnosis not present

## 2022-06-19 DIAGNOSIS — E1169 Type 2 diabetes mellitus with other specified complication: Secondary | ICD-10-CM | POA: Diagnosis not present

## 2022-06-19 DIAGNOSIS — E782 Mixed hyperlipidemia: Secondary | ICD-10-CM | POA: Diagnosis not present

## 2022-07-04 DIAGNOSIS — R002 Palpitations: Secondary | ICD-10-CM | POA: Diagnosis not present

## 2022-07-25 NOTE — Progress Notes (Deleted)
Cardiology Office Note:    Date:  07/25/2022   ID:  Nicholas Mills., DOB Jun 26, 1950, MRN [id number]  PCP:  Lise Auer, MD   [facility] Providers Cardiologist:  Norman Herrlich, MD     Referring MD: Lise Auer, MD   CC: follow up for tachycardia  History of Present Illness:    Nicholas Mills. is a 73 y.o. male with a hx of CAD s/p CABG x 5, RBBB, congestive heart failure, post-op paroxysmal atrial fibrillation, atrial flutter, hypertension, DM2, CKD, hyperlipidemia, carotid artery stenosis.  On 07/10/2018 he was evaluated at [facility] for recent syncope and collapse while he was working outside, was passed out for approximately an hour.  Hindsight he recalls increasing chest pain over the previous week.  He had a [employer] study, which showed an EF of 28% with global hypokinesis, small fixed defect noted in the anterior septal wall, ischemia noted in the anterior septal region and additional ischemia noted in inferior lateral wall mid to base.  Echo on 07/12/2018 revealed an EF of 40 to 45%, mild hypokinesis to the left ventricular entire inferolateral wall, biatrial mild dilatation, mild to moderate TR, mild thickening of the AV.  LHC on 07/15/2018 revealed ostial LM lesion 80% stenosed, proximal circumflex lesion 80% stenosed, proximal RCA lesion 100% stenosis with left-to-right collaterals, second diagonal lesion 90% stenosis.  Carotid ultrasound at this time revealed right ICA with 1 to 39% stenosis, left ICA 40 to 49% stenosis.  07/18/2018 he underwent CABG x 5 LIMA to LAD, SVG to PDA, SVG to Diagonal, and sequential SVG to Ramus Intermediate and OM.  On 07/21/2018 he went into A-fib with RVR, given amiodarone bolus, IV Lopressor, he eventually converted to sinus rhythm.  He was evaluated by his PCP in May 2023 and found to be in atrial flutter, he was started on Eliquis with a CHA2DS2-VASc score of 5 and advised to follow-up with [facility].  He was evaluated by  Lucile Crater, PA on 08/11/2021, and was doing well overall from a cardiac perspective and back in sinus rhythm. He wore a monitor in July 2023, which predominantly showed sinus rhythm, first-degree AV block, BBB was present, 8 runs of SVT the fastest interval lasting 5 beats with a rate 154 bpm.  Isolated SVE's were occasional, SVE couplets were rare, no episodes of atrial fibrillation or flutter. Echo completed on 09/11/2021 revealed an EF of 40 to 45%, mild concentric LVH, grade 3 DD, moderately elevated PASP, mild MR, mild TR.  Previously, unable to afford Entresto, MRA was not started due to potassium at the upper limits of normal.  Most recently he was evaluated by Dr. Dulce Sellar on 12/04/2021, he was doing well related to his cardiomyopathy.  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  He was evaluated by his PCP last week for URI/allergy type symptoms.  Noted to be tachycardic, heart rate 128 bpm, EKG reviewed from their office revealed ST, LPFB, RBBB. Labs were checked, TSH, BNP, BMET normal. CBC revealed hemoglobin 12.9, hematocrit 38.  His metoprolol was increased to 50 mg daily.  He endorses occasional palpitations since then.  He continues to be very active, working in his yard, able to cut trees with a chainsaw.  Denies chest pain, dyspnea, pnd, orthopnea, n, v, dizziness, syncope, edema, weight gain, or early satiety.   Wore monitor, 100% of time in Atrial Flutter.   Past Medical History:  Diagnosis Date  Acute systolic CHF (congestive heart failure) (HCC)    CAD (coronary artery disease) 08/04/2018   Cardiomyopathy (HCC) 07/11/2018   Coronary artery disease    a. 07/15/18 cardiac cath multivessel CAD, CT surgery consulted b.    Diabetes mellitus (HCC)    Hiatal hernia    Hx of CABG    a. 07/18/18 LIMA to LAD - SVG to diagonal 2 - SVG to Ramus and OM - SVG to PDA   Hyperlipidemia    Hypertension    NSTEMI (non-ST elevated myocardial infarction) (HCC)  07/11/2018   PAF (paroxysmal atrial fibrillation) (HCC)    a. briefly post op CABG 07/18/18 treated with amiodarone   Right bundle branch block    Syncope    Syncope and collapse 07/11/2018    Past Surgical History:  Procedure Laterality Date   CARDIAC CATHETERIZATION     CARPAL TUNNEL RELEASE     CORONARY ARTERY BYPASS GRAFT N/A 07/18/2018   Procedure: CORONARY ARTERY BYPASS GRAFTING (CABG) X 5 USING LEFT INTERNAL MAMMARY ARTERY AND RIGHT SAPHENOUS VEIN HARVESTED ENDOSCOPICALLY;  Surgeon: Alleen Borne, MD;  Location: [facility] OR;  Service: Open Heart Surgery;  Laterality: N/A;   LEFT HEART CATH AND CORONARY ANGIOGRAPHY N/A 07/15/2018   Procedure: LEFT HEART CATH AND CORONARY ANGIOGRAPHY;  Surgeon: Corky Crafts, MD;  Location: [facility] INVASIVE CV LAB;  Service: Cardiovascular;  Laterality: N/A;   TEE WITHOUT CARDIOVERSION N/A 07/18/2018   Procedure: TRANSESOPHAGEAL ECHOCARDIOGRAM (TEE);  Surgeon: Alleen Borne, MD;  Location: [facility] OR;  Service: Open Heart Surgery;  Laterality: N/A;   VASECTOMY      Current Medications: No outpatient medications have been marked as taking for the 07/26/22 encounter (Appointment) with [name], NP.     Allergies:   Patient has no known allergies.   Social History   Socioeconomic History   Marital status: Divorced    Spouse name: Not on file   Number of children: Not on file   Years of education: Not on file   Highest education level: Not on file  Occupational History   Not on file  Tobacco Use   Smoking status: Former    Passive exposure: Past   Smokeless tobacco: Never  Vaping Use   Vaping Use: Never used  Substance and Sexual Activity   Alcohol use: Yes    Comment: beer occasionally   Drug use: Never   Sexual activity: Not on file  Other Topics Concern   Not on file  Social History Narrative   Not on file   Social Determinants of Health   Financial Resource Strain: Not on file  Food Insecurity: Not on file  Transportation  Needs: Not on file  Physical Activity: Not on file  Stress: Not on file  Social Connections: Not on file     Family History: The patient's family history includes CAD in his mother; Macular degeneration in his mother; Obesity in his sister; Peripheral vascular disease in his father; Valvular heart disease in his mother.  ROS:   Please see the history of present illness.     All other systems reviewed and are negative.  EKGs/Labs/Other Studies Reviewed:    The following studies were reviewed today: Cardiac Studies & Procedures   CARDIAC CATHETERIZATION  CARDIAC CATHETERIZATION 07/15/2018  Narrative  Ost LM lesion is 80% stenosed.  Prox Cx lesion is 80% stenosed.  Prox RCA lesion is 100% stenosed. Left to right collaterals.  Prox LAD to Mid LAD lesion is 25%  stenosed.  2nd Diag lesion is 90% stenosed.  LV end diastolic pressure is normal.  There is no aortic valve stenosis.  Plan for surgery consult.  Move to stepdown.  Findings Coronary Findings Diagnostic  Dominance: Right  Left Main Ost LM lesion is 80% stenosed. The lesion is eccentric, irregular and ulcerative.  Left Anterior Descending Prox LAD to Mid LAD lesion is 25% stenosed.  Second Diagonal Branch 2nd Diag lesion is 90% stenosed.  Left Circumflex Prox Cx lesion is 80% stenosed.  Right Coronary Artery Prox RCA lesion is 100% stenosed.  Right Posterior Descending Artery Collaterals RPDA filled by collaterals from 3rd Sept.  Intervention  No interventions have been documented.   STRESS TESTS  MYOCARDIAL PERFUSION IMAGING 07/24/2018   ECHOCARDIOGRAM  ECHOCARDIOGRAM COMPLETE 09/11/2021  Narrative ECHOCARDIOGRAM REPORT    Patient Name:   Jyquez Venegas. Date of Exam: 09/11/2021 Medical Rec #:  [id number]         Height:       70.0 in Accession #:    [id number]        Weight:       178.6 lb Date of Birth:  10-01-1950         BSA:          1.989 m Patient Age:    71 years          BP:            118/62 mmHg Patient Gender: M                 HR:           83 bpm. Exam Location:  [place]  Procedure: 2D Echo, Cardiac Doppler, Color Doppler, Strain Analysis and Intracardiac Opacification Agent  Indications:    Cardiomyopathy-Ischemic I25.5  History:        Patient has prior history of Echocardiogram examinations, most recent 01/09/2021. Arrythmias:Atrial Fibrillation and RBBB; Risk Factors:Dyslipidemia and Hypertension.  Sonographer:    Louie Boston RDCS Referring Phys: [phone] [facility] DUNN   Sonographer Comments: Suboptimal apical window. IMPRESSIONS   1. Left ventricular ejection fraction, by estimation, is 40 to 45%. The left ventricle has mildly decreased function. The left ventricle demonstrates global hypokinesis. There is mild concentric left ventricular hypertrophy. Left ventricular diastolic parameters are consistent with Grade III diastolic dysfunction (restrictive). Elevated left atrial pressure. 2. Right ventricular systolic function is mildly reduced. The right ventricular size is normal. There is moderately elevated pulmonary artery systolic pressure. 3. Right atrial size was moderately dilated. 4. The mitral valve is normal in structure. Mild mitral valve regurgitation. No evidence of mitral stenosis. 5. The aortic valve is tricuspid. Aortic valve regurgitation is not visualized. Aortic valve sclerosis is present, with no evidence of aortic valve stenosis. 6. Aortic Normal DTA. 7. The inferior vena cava is normal in size with greater than 50% respiratory variability, suggesting right atrial pressure of 3 mmHg.  FINDINGS Left Ventricle: Left ventricular ejection fraction, by estimation, is 40 to 45%. The left ventricle has mildly decreased function. The left ventricle demonstrates global hypokinesis. Definity contrast agent was given IV to delineate the left ventricular endocardial borders. The left ventricular internal cavity size was normal in size. There is  mild concentric left ventricular hypertrophy. Left ventricular diastolic parameters are consistent with Grade III diastolic dysfunction (restrictive). Elevated left atrial pressure.  Right Ventricle: The right ventricular size is normal. No increase in right ventricular wall thickness. Right ventricular systolic function is mildly reduced. There  is moderately elevated pulmonary artery systolic pressure. The tricuspid regurgitant velocity is 3.58 m/s, and with an assumed right atrial pressure of 3 mmHg, the estimated right ventricular systolic pressure is 54.3 mmHg.  Left Atrium: Left atrial size was normal in size.  Right Atrium: Right atrial size was moderately dilated.  Pericardium: There is no evidence of pericardial effusion.  Mitral Valve: The mitral valve is normal in structure. Mild mitral valve regurgitation. No evidence of mitral valve stenosis.  Tricuspid Valve: The tricuspid valve is normal in structure. Tricuspid valve regurgitation is mild . No evidence of tricuspid stenosis.  Aortic Valve: The aortic valve is tricuspid. Aortic valve regurgitation is not visualized. Aortic valve sclerosis is present, with no evidence of aortic valve stenosis.  Pulmonic Valve: The pulmonic valve was normal in structure. Pulmonic valve regurgitation is not visualized. No evidence of pulmonic stenosis.  Aorta: The aortic root and ascending aorta are structurally normal, with no evidence of dilitation, the aortic arch was not well visualized and Normal DTA.  Venous: The pulmonary veins were not well visualized. The inferior vena cava is normal in size with greater than 50% respiratory variability, suggesting right atrial pressure of 3 mmHg.  IAS/Shunts: No atrial level shunt detected by color flow Doppler.   LEFT VENTRICLE PLAX 2D LVIDd:         4.80 cm     Diastology LV PW:         1.20 cm     LV e' medial:    3.65 cm/s LV IVS:        1.20 cm     LV E/e' medial:  28.5 LVOT diam:     2.00 cm      LV e' lateral:   6.89 cm/s LV SV:         48          LV E/e' lateral: 15.1 LV SV Index:   24 LVOT Area:     3.14 cm  LV Volumes (MOD) LV vol d, MOD A2C: 58.5 ml LV vol d, MOD A4C: 72.9 ml LV vol s, MOD A2C: 33.8 ml LV vol s, MOD A4C: 32.7 ml LV SV MOD A2C:     24.7 ml LV SV MOD A4C:     72.9 ml LV SV MOD BP:      31.6 ml  RIGHT VENTRICLE RV S prime:     6.41 cm/s TAPSE (M-mode): 1.6 cm  LEFT ATRIUM             Index        RIGHT ATRIUM           Index LA diam:        3.20 cm 1.61 cm/m   RA Area:     19.50 cm LA Vol (A2C):   73.9 ml 37.15 ml/m  RA Volume:   51.40 ml  25.84 ml/m LA Vol (A4C):   80.2 ml 40.32 ml/m LA Biplane Vol: 84.1 ml 42.28 ml/m AORTIC VALVE LVOT Vmax:   73.20 cm/s LVOT Vmean:  51.900 cm/s LVOT VTI:    0.153 m  AORTA Ao Root diam: 3.70 cm Ao Asc diam:  3.50 cm Ao Desc diam: 1.90 cm  MITRAL VALVE                  TRICUSPID VALVE MV Area (PHT): 5.54 cm       TR Peak grad:   51.3 mmHg MV Decel Time: 137 msec  TR Vmax:        358.00 cm/s MR Peak grad:    86.5 mmHg MR Mean grad:    61.0 mmHg    SHUNTS MR Vmax:         465.00 cm/s  Systemic VTI:  0.15 m MR Vmean:        368.0 cm/s   Systemic Diam: 2.00 cm MR PISA:         2.26 cm MR PISA Eff ROA: 19 mm MR PISA Radius:  0.60 cm MV E velocity: 104.00 cm/s MV A velocity: 32.50 cm/s MV E/A ratio:  3.20  Norman Herrlich MD Electronically signed by Norman Herrlich MD Signature Date/Time: 09/11/2021/4:51:54 PM    Final   TEE  ECHO INTRAOPERATIVE TEE 07/18/2018  Interpretation Summary   Left ventricle: Normal cavity size and wall thickness. Wall motion is abnormal.   Left atrium: Cavity is dilated and dilated.   Aortic valve: No stenosis.   Mitral valve:  Mild to moderate regurgitation.   Right ventricle:  Normal cavity size, wall thickness and ejection fraction.   Right atrium:  Cavity is dilated.   Tricuspid valve: Mild regurgitation.   MONITORS  LONG TERM MONITOR (3-14 DAYS)  08/30/2021  Narrative Patch Wear Time:  6 days and 9 hours (2023-06-27T08:34:16-0400 to 2023-07-03T18:13:14-0400)  Patient had a min HR of 57 bpm, max HR of 154 bpm, and avg HR of 77 bpm. Predominant underlying rhythm was Sinus Rhythm. First Degree AV Block was present. Bundle Branch Block/IVCD was present.  8 Supraventricular Tachycardia runs occurred, the run with the fastest interval lasting 5 beats with a max rate of 154 bpm, the longest lasting 8 beats with an avg rate of 101 bpm.  Isolated SVEs were occasional (1.6%, 11055), SVE Couplets were rare (<1.0%, 3033), and no SVE Triplets were present. There were no episodes of atrial fibrillation or flutter.  There were no episodes of sinus pauses secondary third-degree AV block or sinus node exit block.  Isolated VEs were rare (<1.0%), VE Couplets were rare (<1.0%), and no VE Triplets were present.  There is 1 single triggered event sinus rhythm            EKG:  EKG is not ordered today.    Recent Labs: 08/11/2021: ALT 15; BUN 15; Creatinine, Ser 1.12; Hemoglobin 15.0; Platelets 163; Potassium 4.8; Sodium 136; TSH 1.910  Recent Lipid Panel    Component Value Date/Time   CHOL 99 (L) 09/24/2018 1426   TRIG 110 09/24/2018 1426   HDL 40 09/24/2018 1426   CHOLHDL 2.5 09/24/2018 1426   CHOLHDL 4.5 07/12/2018 0350   VLDL 74 (H) 07/12/2018 0350   LDLCALC 37 09/24/2018 1426     Risk Assessment/Calculations:    CHA2DS2-VASc Score = 5   This indicates a 7.2% annual risk of stroke. The patient's score is based upon: CHF History: 1 HTN History: 1 Diabetes History: 1 Stroke History: 0 Vascular Disease History: 1 Age Score: 1 Gender Score: 0   {This patient has a significant risk of stroke if diagnosed with atrial fibrillation.  Please consider VKA or DOAC agent for anticoagulation if the bleeding risk is acceptable.   You can also use the SmartPhrase .[email or web address] for documentation.   :[id number]}  No BP recorded.  {Refresh Note OR  Click here to enter BP  :1}***         Physical Exam:    VS:  There were no vitals taken for this visit.    Wt Readings  from Last 3 Encounters:  06/14/22 177 lb (80.3 kg)  06/08/22 176 lb (79.8 kg)  12/04/21 177 lb 12.8 oz (80.6 kg)     GEN:  Well nourished, well developed in no acute distress HEENT: Normal NECK: No JVD; No carotid bruits LYMPHATICS: No lymphadenopathy CARDIAC: RRR, no murmurs, rubs, gallops RESPIRATORY:  Clear to auscultation without rales, wheezing or rhonchi  ABDOMEN: Soft, non-tender, non-distended MUSCULOSKELETAL:  trace pedal edema; No deformity  SKIN: Warm and dry NEUROLOGIC:  Alert and oriented x 3 PSYCHIATRIC:  Normal affect   ASSESSMENT:    1. Atrial flutter, unspecified type (HCC)   2. Palpitations   3. CAD in native artery   4. Chronic combined systolic and diastolic CHF (congestive heart failure) (HCC)   5. Essential hypertension   6. Hypercoagulable state (HCC)     PLAN:    In order of problems listed above:  CAD-CABG x 5 in 2020, Stable with no anginal symptoms. No indication for ischemic evaluation.  Continue metoprolol 50 mg twice daily.  Paroxysmal atrial fibrillation/atrial flutter/hypercoagulable state-CHA2DS2-VASc or 5, continue Eliquis 5 mg twice daily--no indication for dose reduction.  Continue metoprolol 50 mg twice daily.  Denies hematochezia, hematuria, hemoptysis.  Palpitations/tachycardia-recently noted at PCP visit last week, he was asymptomatic although does endorse some palpitations.  Labs at PCP were overall unrevealing for causes, although he does have some new anemia however denies any bleeding.  Will wear a ZIO monitor for 14 days to assess for tachyarrhythmias, A-fib, a flutter. Ischemic cardiomyopathy/combined heart failure-NYHA class I, euvolemic.  Echo on 09/11/2021 revealed an EF of 40 to 45%, continue metoprolol 50 mg twice daily, Jardiance 25 mg daily--although DM dosing, valsartan 40 mg twice  daily. Hyperlipidemia-has a history of statin induced myopathy, not clear who is monitoring his lipids currently.  Will evaluate at next visit. Hypertension-blood pressure is well-controlled today at 120/88, continue metoprolol 50 mg twice a day, continue valsartan 40 mg twice daily. Anemia- hemoglobin 12.9, hematocrit 38 a week ago; in June 2023 his hemoglobin was 15 and hematocrit 43.  He denies hematochezia, hematuria, hemoptysis.  Will discuss at next visit if he has had a recent GI evaluation/colonoscopy.  Disposition-ZIO monitor x 2 weeks, return in 6 weeks.         Medication Adjustments/Labs and Tests Ordered: Current medicines are reviewed at length with the patient today.  Concerns regarding medicines are outlined above.  No orders of the defined types were placed in this encounter.  No orders of the defined types were placed in this encounter.   There are no Patient Instructions on file for this visit.   Signed, [name], NP  07/25/2022 6:39 PM    [facility]

## 2022-07-26 ENCOUNTER — Ambulatory Visit: Payer: PPO | Admitting: Cardiology

## 2022-07-26 DIAGNOSIS — I5042 Chronic combined systolic (congestive) and diastolic (congestive) heart failure: Secondary | ICD-10-CM

## 2022-07-26 DIAGNOSIS — I10 Essential (primary) hypertension: Secondary | ICD-10-CM

## 2022-07-26 DIAGNOSIS — R002 Palpitations: Secondary | ICD-10-CM

## 2022-07-26 DIAGNOSIS — I4892 Unspecified atrial flutter: Secondary | ICD-10-CM

## 2022-07-26 DIAGNOSIS — I2510 Atherosclerotic heart disease of native coronary artery without angina pectoris: Secondary | ICD-10-CM

## 2022-07-26 DIAGNOSIS — D6859 Other primary thrombophilia: Secondary | ICD-10-CM

## 2022-08-03 ENCOUNTER — Ambulatory Visit: Payer: PPO | Attending: Cardiology | Admitting: Cardiology

## 2022-08-03 ENCOUNTER — Encounter: Payer: Self-pay | Admitting: Cardiology

## 2022-08-03 VITALS — BP 122/82 | HR 80 | Ht 70.0 in | Wt 175.4 lb

## 2022-08-03 DIAGNOSIS — I480 Paroxysmal atrial fibrillation: Secondary | ICD-10-CM | POA: Diagnosis not present

## 2022-08-03 DIAGNOSIS — I5042 Chronic combined systolic (congestive) and diastolic (congestive) heart failure: Secondary | ICD-10-CM

## 2022-08-03 DIAGNOSIS — I10 Essential (primary) hypertension: Secondary | ICD-10-CM

## 2022-08-03 NOTE — Progress Notes (Signed)
Cardiology Office Note:    Date:  08/03/2022   ID:  Nicholas Solian., DOB Aug 09, 1950, MRN [id number]  PCP:  Lise Auer, MD right   [facility] Providers Cardiologist:  Norman Herrlich, MD     Referring MD: Lise Auer, MD   CC: follow up for tachycardia  History of Present Illness:    Nicholas Gery. is a 72 y.o. male with a hx of CAD s/p CABG x 5, RBBB, congestive heart failure, paroxysmal atrial fibrillation, hypertension, DM2, CKD, hyperlipidemia, carotid artery stenosis.  On 07/10/2018 he was evaluated at [facility] for recent syncope and collapse while he was working outside, was passed out for approximately an hour.  Hindsight he recalls increasing chest pain over the previous week.  He had a [employer] study, which showed an EF of 28% with global hypokinesis, small fixed defect noted in the anterior septal wall, ischemia noted in the anterior septal region and additional ischemia noted in inferior lateral wall mid to base.  Echo on 07/12/2018 revealed an EF of 40 to 45%, mild hypokinesis to the left ventricular entire inferolateral wall, biatrial mild dilatation, mild to moderate TR, mild thickening of the AV.  LHC on 07/15/2018 revealed ostial LM lesion 80% stenosed, proximal circumflex lesion 80% stenosed, proximal RCA lesion 100% stenosis with left-to-right collaterals, second diagonal lesion 90% stenosis.  Carotid ultrasound at this time revealed right ICA with 1 to 39% stenosis, left ICA 40 to 49% stenosis.  07/18/2018 he underwent CABG x 5 LIMA to LAD, SVG to PDA, SVG to Diagonal, and sequential SVG to Ramus Intermediate and OM.  On 07/21/2018 he went into A-fib with RVR, given amiodarone bolus, IV Lopressor, he eventually converted to sinus rhythm.  He was evaluated by his PCP in May 2023 and found to be in atrial flutter, he was started on Eliquis with a CHA2DS2-VASc score of 5 and advised to follow-up with [facility].  He was evaluated by Lucile Crater, PA on  08/11/2021, and was doing well overall from a cardiac perspective and back in sinus rhythm. He wore a monitor in July 2023, which predominantly showed sinus rhythm, first-degree AV block, BBB was present, 8 runs of SVT the fastest interval lasting 5 beats with a rate 154 bpm.  Isolated SVE's were occasional, SVE couplets were rare, no episodes of atrial fibrillation or flutter. Echo completed on 09/11/2021 revealed an EF of 40 to 45%, mild concentric LVH, grade 3 DD, moderately elevated PASP, mild MR, mild TR.  Previously, unable to afford Entresto, MRA was not started due to potassium at the upper limits of normal.  Most recently he was evaluated by Dr. Dulce Sellar on 12/04/2021, he was doing well related to his cardiomyopathy.  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  He was evaluated by his PCP last week for URI/allergy type symptoms.  Noted to be tachycardic, heart rate 128 bpm, EKG reviewed from their office revealed ST, LPFB, RBBB. Labs were checked, TSH, BNP, BMET normal. CBC revealed hemoglobin 12.9, hematocrit 38.  His metoprolol was increased to 50 mg daily.  A monitor was arranged, has not been formally reviewed, informal review reveals he is in atrial flutter 100% of the time, heart rate ranging between 58 to 144 bpm, average 97 bpm.   He presents today for follow-up of his tachycardia.  His heart rate is controlled today however he is in atrial flutter.  His biggest complaint continues to be  fatigue. He denies chest pain, palpitations, dyspnea, pnd, orthopnea, n, v, dizziness, syncope, edema, weight gain, or early satiety.   Past Medical History:  Diagnosis Date   Acute systolic CHF (congestive heart failure) (HCC)    CAD (coronary artery disease) 08/04/2018   Cardiomyopathy (HCC) 07/11/2018   Coronary artery disease    a. 07/15/18 cardiac cath multivessel CAD, CT surgery consulted b.    Diabetes mellitus (HCC)    Hiatal hernia    Hx of CABG    a.  07/18/18 LIMA to LAD - SVG to diagonal 2 - SVG to Ramus and OM - SVG to PDA   Hyperlipidemia    Hypertension    NSTEMI (non-ST elevated myocardial infarction) (HCC) 07/11/2018   PAF (paroxysmal atrial fibrillation) (HCC)    a. briefly post op CABG 07/18/18 treated with amiodarone   Right bundle branch block    Syncope    Syncope and collapse 07/11/2018    Past Surgical History:  Procedure Laterality Date   CARDIAC CATHETERIZATION     CARPAL TUNNEL RELEASE     CORONARY ARTERY BYPASS GRAFT N/A 07/18/2018   Procedure: CORONARY ARTERY BYPASS GRAFTING (CABG) X 5 USING LEFT INTERNAL MAMMARY ARTERY AND RIGHT SAPHENOUS VEIN HARVESTED ENDOSCOPICALLY;  Surgeon: Alleen Borne, MD;  Location: [facility] OR;  Service: Open Heart Surgery;  Laterality: N/A;   LEFT HEART CATH AND CORONARY ANGIOGRAPHY N/A 07/15/2018   Procedure: LEFT HEART CATH AND CORONARY ANGIOGRAPHY;  Surgeon: Corky Crafts, MD;  Location: [facility] INVASIVE CV LAB;  Service: Cardiovascular;  Laterality: N/A;   TEE WITHOUT CARDIOVERSION N/A 07/18/2018   Procedure: TRANSESOPHAGEAL ECHOCARDIOGRAM (TEE);  Surgeon: Alleen Borne, MD;  Location: [facility];  Service: Open Heart Surgery;  Laterality: N/A;   VASECTOMY      Current Medications: Current Meds  Medication Sig   Coenzyme Q10 (COQ10) 100 MG CAPS Take 100 mg by mouth daily.    ELIQUIS 5 MG TABS tablet Take 5 mg by mouth 2 (two) times daily.   gabapentin (NEURONTIN) 100 MG capsule Take 100-200 mg by mouth at bedtime as needed (pain).   JARDIANCE 25 MG TABS tablet Take 25 mg by mouth daily.   levocetirizine (XYZAL) 5 MG tablet Take 5 mg by mouth daily as needed for allergies.   metoprolol tartrate (LOPRESSOR) 50 MG tablet Take 50 mg by mouth 2 (two) times daily.   predniSONE (DELTASONE) 20 MG tablet Take 40 mg by mouth daily with breakfast. For 5 days, started 06/11/22   tadalafil (CIALIS) 20 MG tablet Take 20 mg by mouth daily as needed for erectile dysfunction.   traMADol (ULTRAM) 50 MG  tablet Take 50 mg by mouth daily as needed for moderate pain.   valsartan (DIOVAN) 40 MG tablet Take 1 tablet (40 mg total) by mouth 2 (two) times daily.   zolpidem (AMBIEN) 10 MG tablet Take 10 mg by mouth at bedtime as needed for sleep.     Allergies:   Patient has no known allergies.   Social History   Socioeconomic History   Marital status: Divorced    Spouse name: Not on file   Number of children: Not on file   Years of education: Not on file   Highest education level: Not on file  Occupational History   Not on file  Tobacco Use   Smoking status: Former    Passive exposure: Past   Smokeless tobacco: Never  Vaping Use   Vaping Use: Never used  Substance and Sexual  Activity   Alcohol use: Yes    Comment: beer occasionally   Drug use: Never   Sexual activity: Not on file  Other Topics Concern   Not on file  Social History Narrative   Not on file   Social Determinants of Health   Financial Resource Strain: Not on file  Food Insecurity: Not on file  Transportation Needs: Not on file  Physical Activity: Not on file  Stress: Not on file  Social Connections: Not on file     Family History: The patient's family history includes CAD in his mother; Macular degeneration in his mother; Obesity in his sister; Peripheral vascular disease in his father; Valvular heart disease in his mother.  ROS:   Please see the history of present illness.     All other systems reviewed and are negative.  EKGs/Labs/Other Studies Reviewed:    The following studies were reviewed today: Cardiac Studies & Procedures   CARDIAC CATHETERIZATION  CARDIAC CATHETERIZATION 07/15/2018  Narrative  Ost LM lesion is 80% stenosed.  Prox Cx lesion is 80% stenosed.  Prox RCA lesion is 100% stenosed. Left to right collaterals.  Prox LAD to Mid LAD lesion is 25% stenosed.  2nd Diag lesion is 90% stenosed.  LV end diastolic pressure is normal.  There is no aortic valve stenosis.  Plan for  surgery consult.  Move to stepdown.  Findings Coronary Findings Diagnostic  Dominance: Right  Left Main Ost LM lesion is 80% stenosed. The lesion is eccentric, irregular and ulcerative.  Left Anterior Descending Prox LAD to Mid LAD lesion is 25% stenosed.  Second Diagonal Branch 2nd Diag lesion is 90% stenosed.  Left Circumflex Prox Cx lesion is 80% stenosed.  Right Coronary Artery Prox RCA lesion is 100% stenosed.  Right Posterior Descending Artery Collaterals RPDA filled by collaterals from 3rd Sept.  Intervention  No interventions have been documented.   STRESS TESTS  MYOCARDIAL PERFUSION IMAGING 07/24/2018   ECHOCARDIOGRAM  ECHOCARDIOGRAM COMPLETE 09/11/2021  Narrative ECHOCARDIOGRAM REPORT    Patient Name:   Nicholas Longaker. Date of Exam: 09/11/2021 Medical Rec #:  [id number]         Height:       70.0 in Accession #:    [id number]        Weight:       178.6 lb Date of Birth:  Apr 13, 1950         BSA:          1.989 m Patient Age:    71 years          BP:           118/62 mmHg Patient Gender: M                 HR:           83 bpm. Exam Location:  [place]  Procedure: 2D Echo, Cardiac Doppler, Color Doppler, Strain Analysis and Intracardiac Opacification Agent  Indications:    Cardiomyopathy-Ischemic I25.5  History:        Patient has prior history of Echocardiogram examinations, most recent 01/09/2021. Arrythmias:Atrial Fibrillation and RBBB; Risk Factors:Dyslipidemia and Hypertension.  Sonographer:    Louie Boston RDCS Referring Phys: [phone] [facility] N DUNN   Sonographer Comments: Suboptimal apical window. IMPRESSIONS   1. Left ventricular ejection fraction, by estimation, is 40 to 45%. The left ventricle has mildly decreased function. The left ventricle demonstrates global hypokinesis. There is mild concentric left ventricular hypertrophy. Left ventricular diastolic parameters are consistent  with Grade III diastolic dysfunction (restrictive). Elevated  left atrial pressure. 2. Right ventricular systolic function is mildly reduced. The right ventricular size is normal. There is moderately elevated pulmonary artery systolic pressure. 3. Right atrial size was moderately dilated. 4. The mitral valve is normal in structure. Mild mitral valve regurgitation. No evidence of mitral stenosis. 5. The aortic valve is tricuspid. Aortic valve regurgitation is not visualized. Aortic valve sclerosis is present, with no evidence of aortic valve stenosis. 6. Aortic Normal DTA. 7. The inferior vena cava is normal in size with greater than 50% respiratory variability, suggesting right atrial pressure of 3 mmHg.  FINDINGS Left Ventricle: Left ventricular ejection fraction, by estimation, is 40 to 45%. The left ventricle has mildly decreased function. The left ventricle demonstrates global hypokinesis. Definity contrast agent was given IV to delineate the left ventricular endocardial borders. The left ventricular internal cavity size was normal in size. There is mild concentric left ventricular hypertrophy. Left ventricular diastolic parameters are consistent with Grade III diastolic dysfunction (restrictive). Elevated left atrial pressure.  Right Ventricle: The right ventricular size is normal. No increase in right ventricular wall thickness. Right ventricular systolic function is mildly reduced. There is moderately elevated pulmonary artery systolic pressure. The tricuspid regurgitant velocity is 3.58 m/s, and with an assumed right atrial pressure of 3 mmHg, the estimated right ventricular systolic pressure is 54.3 mmHg.  Left Atrium: Left atrial size was normal in size.  Right Atrium: Right atrial size was moderately dilated.  Pericardium: There is no evidence of pericardial effusion.  Mitral Valve: The mitral valve is normal in structure. Mild mitral valve regurgitation. No evidence of mitral valve stenosis.  Tricuspid Valve: The tricuspid valve is normal in  structure. Tricuspid valve regurgitation is mild . No evidence of tricuspid stenosis.  Aortic Valve: The aortic valve is tricuspid. Aortic valve regurgitation is not visualized. Aortic valve sclerosis is present, with no evidence of aortic valve stenosis.  Pulmonic Valve: The pulmonic valve was normal in structure. Pulmonic valve regurgitation is not visualized. No evidence of pulmonic stenosis.  Aorta: The aortic root and ascending aorta are structurally normal, with no evidence of dilitation, the aortic arch was not well visualized and Normal DTA.  Venous: The pulmonary veins were not well visualized. The inferior vena cava is normal in size with greater than 50% respiratory variability, suggesting right atrial pressure of 3 mmHg.  IAS/Shunts: No atrial level shunt detected by color flow Doppler.   LEFT VENTRICLE PLAX 2D LVIDd:         4.80 cm     Diastology LV PW:         1.20 cm     LV e' medial:    3.65 cm/s LV IVS:        1.20 cm     LV E/e' medial:  28.5 LVOT diam:     2.00 cm     LV e' lateral:   6.89 cm/s LV SV:         48          LV E/e' lateral: 15.1 LV SV Index:   24 LVOT Area:     3.14 cm  LV Volumes (MOD) LV vol d, MOD A2C: 58.5 ml LV vol d, MOD A4C: 72.9 ml LV vol s, MOD A2C: 33.8 ml LV vol s, MOD A4C: 32.7 ml LV SV MOD A2C:     24.7 ml LV SV MOD A4C:     72.9 ml LV SV MOD BP:  31.6 ml  RIGHT VENTRICLE RV S prime:     6.41 cm/s TAPSE (M-mode): 1.6 cm  LEFT ATRIUM             Index        RIGHT ATRIUM           Index LA diam:        3.20 cm 1.61 cm/m   RA Area:     19.50 cm LA Vol (A2C):   73.9 ml 37.15 ml/m  RA Volume:   51.40 ml  25.84 ml/m LA Vol (A4C):   80.2 ml 40.32 ml/m LA Biplane Vol: 84.1 ml 42.28 ml/m AORTIC VALVE LVOT Vmax:   73.20 cm/s LVOT Vmean:  51.900 cm/s LVOT VTI:    0.153 m  AORTA Ao Root diam: 3.70 cm Ao Asc diam:  3.50 cm Ao Desc diam: 1.90 cm  MITRAL VALVE                  TRICUSPID VALVE MV Area (PHT): 5.54 cm        TR Peak grad:   51.3 mmHg MV Decel Time: 137 msec       TR Vmax:        358.00 cm/s MR Peak grad:    86.5 mmHg MR Mean grad:    61.0 mmHg    SHUNTS MR Vmax:         465.00 cm/s  Systemic VTI:  0.15 m MR Vmean:        368.0 cm/s   Systemic Diam: 2.00 cm MR PISA:         2.26 cm MR PISA Eff ROA: 19 mm MR PISA Radius:  0.60 cm MV E velocity: 104.00 cm/s MV A velocity: 32.50 cm/s MV E/A ratio:  3.20  Norman Herrlich MD Electronically signed by Norman Herrlich MD Signature Date/Time: 09/11/2021/4:51:54 PM    Final   TEE  ECHO INTRAOPERATIVE TEE 07/18/2018  Interpretation Summary   Left ventricle: Normal cavity size and wall thickness. Wall motion is abnormal.   Left atrium: Cavity is dilated and dilated.   Aortic valve: No stenosis.   Mitral valve:  Mild to moderate regurgitation.   Right ventricle:  Normal cavity size, wall thickness and ejection fraction.   Right atrium:  Cavity is dilated.   Tricuspid valve: Mild regurgitation.   MONITORS  LONG TERM MONITOR (3-14 DAYS) 08/30/2021  Narrative Patch Wear Time:  6 days and 9 hours (2023-06-27T08:34:16-0400 to 2023-07-03T18:13:14-0400)  Patient had a min HR of 57 bpm, max HR of 154 bpm, and avg HR of 77 bpm. Predominant underlying rhythm was Sinus Rhythm. First Degree AV Block was present. Bundle Branch Block/IVCD was present.  8 Supraventricular Tachycardia runs occurred, the run with the fastest interval lasting 5 beats with a max rate of 154 bpm, the longest lasting 8 beats with an avg rate of 101 bpm.  Isolated SVEs were occasional (1.6%, 11055), SVE Couplets were rare (<1.0%, 3033), and no SVE Triplets were present. There were no episodes of atrial fibrillation or flutter.  There were no episodes of sinus pauses secondary third-degree AV block or sinus node exit block.  Isolated VEs were rare (<1.0%), VE Couplets were rare (<1.0%), and no VE Triplets were present.  There is 1 single triggered event sinus rhythm             EKG:  EKG is not ordered today.    Recent Labs: 08/11/2021: ALT 15; BUN 15; Creatinine, Ser 1.12; Hemoglobin  15.0; Platelets 163; Potassium 4.8; Sodium 136; TSH 1.910  Recent Lipid Panel    Component Value Date/Time   CHOL 99 (L) 09/24/2018 1426   TRIG 110 09/24/2018 1426   HDL 40 09/24/2018 1426   CHOLHDL 2.5 09/24/2018 1426   CHOLHDL 4.5 07/12/2018 0350   VLDL 74 (H) 07/12/2018 0350   LDLCALC 37 09/24/2018 1426     Risk Assessment/Calculations:    CHA2DS2-VASc Score = 5   This indicates a 7.2% annual risk of stroke. The patient's score is based upon: CHF History: 1 HTN History: 1 Diabetes History: 1 Stroke History: 0 Vascular Disease History: 1 Age Score: 1 Gender Score: 0               Physical Exam:    VS:  BP 122/82 (BP Location: Right Arm, Patient Position: Sitting, Cuff Size: Normal)   Pulse 80   Ht 5\' 10"  (1.778 m)   Wt 175 lb 6.4 oz (79.6 kg)   SpO2 94%   BMI 25.17 kg/m     Wt Readings from Last 3 Encounters:  08/03/22 175 lb 6.4 oz (79.6 kg)  06/14/22 177 lb (80.3 kg)  06/08/22 176 lb (79.8 kg)     GEN:  Well nourished, well developed in no acute distress HEENT: Normal NECK: No JVD; No carotid bruits LYMPHATICS: No lymphadenopathy CARDIAC: RRR, no murmurs, rubs, gallops RESPIRATORY:  Clear to auscultation without rales, wheezing or rhonchi  ABDOMEN: Soft, non-tender, non-distended MUSCULOSKELETAL:  trace pedal edema; No deformity  SKIN: Warm and dry NEUROLOGIC:  Alert and oriented x 3 PSYCHIATRIC:  Normal affect   ASSESSMENT:    1. PAF (paroxysmal atrial fibrillation) (HCC)   2. Chronic combined systolic and diastolic CHF (congestive heart failure) (HCC)   3. Essential hypertension     PLAN:    In order of problems listed above:  CAD-CABG x 5 in 2020, Stable with no anginal symptoms. No indication for ischemic evaluation.  Continue metoprolol 50 mg twice daily. On Eliquis therefore he is not on aspirin. Paroxysmal atrial  fibrillation/atrial flutter/hypercoagulable state-he is in atrial flutter today, CHA2DS2-VASc score of 5, continue Eliquis 5 mg twice daily--no indication for dose reduction.  Continue metoprolol 50 mg twice daily.  Denies hematochezia, hematuria, hemoptysis.  We discussed DCCV, including benefits/risks, answered all questions to his satisfaction and he would like to proceed with cardioversion, unfortunately he has inadvertently missed a few doses of his Eliquis.  We reviewed importance of adhering to twice daily regimen, he will return in 3 weeks for repeat EKG and OV and then DCCV will be arranged. Palpitations/tachycardia-monitor revealed persistent atrial flutter, continue metoprolol 50 mg twice daily, proceeding with DCCV as outlined above. Ischemic cardiomyopathy/combined heart failure-NYHA class I, euvolemic.  Echo on 09/11/2021 revealed an EF of 40 to 45%, continue metoprolol 50 mg twice daily, Jardiance 25 mg daily--although DM dosing, valsartan 40 mg twice daily. Hyperlipidemia-has a history of statin induced myopathy, not clear who is monitoring his lipids currently.  Will evaluate at next visit. Hypertension-blood pressure is well-controlled today at 120/88, continue metoprolol 50 mg twice a day, continue valsartan 40 mg twice daily.  Disposition-return in 3 weeks, will plan for DCCV if he is still in atrial flutter.  Does not talk about       Medication Adjustments/Labs and Tests Ordered: Current medicines are reviewed at length with the patient today.  Concerns regarding medicines are outlined above.  Orders Placed This Encounter  Procedures   EKG 12-Lead  No orders of the defined types were placed in this encounter.   Patient Instructions  Medication Instructions:  Your physician recommends that you continue on your current medications as directed. Please refer to the Current Medication list given to you today.  *If you need a refill on your cardiac medications before your next  appointment, please call your pharmacy*   Lab Work: NONE If you have labs (blood work) drawn today and your tests are completely normal, you will receive your results only by: MyChart Message (if you have MyChart) OR A paper copy in the mail If you have any lab test that is abnormal or we need to change your treatment, we will call you to review the results.   Testing/Procedures: NONE   Follow-Up: At [facility], you and your health needs are our priority.  As part of our continuing mission to provide you with exceptional heart care, we have created designated Provider Care Teams.  These Care Teams include your primary Cardiologist (physician) and Advanced Practice Providers (APPs -  Physician Assistants and Nurse Practitioners) who all work together to provide you with the care you need, when you need it.  We recommend signing up for the patient portal called "MyChart".  Sign up information is provided on this After Visit Summary.  MyChart is used to connect with patients for Virtual Visits (Telemedicine).  Patients are able to view lab/test results, encounter notes, upcoming appointments, etc.  Non-urgent messages can be sent to your provider as well.   To learn more about what you can do with MyChart, go to [email or web address].    Your next appointment:   3 week(s)  Provider:   Norman Herrlich, MD    Other Instructions     Signed, [name], NP  08/03/2022 1:59 PM    [facility]

## 2022-08-03 NOTE — Patient Instructions (Signed)
Medication Instructions:  Your physician recommends that you continue on your current medications as directed. Please refer to the Current Medication list given to you today.  *If you need a refill on your cardiac medications before your next appointment, please call your pharmacy*   Lab Work: NONE If you have labs (blood work) drawn today and your tests are completely normal, you will receive your results only by: MyChart Message (if you have MyChart) OR A paper copy in the mail If you have any lab test that is abnormal or we need to change your treatment, we will call you to review the results.   Testing/Procedures: NONE   Follow-Up: At [facility], you and your health needs are our priority.  As part of our continuing mission to provide you with exceptional heart care, we have created designated Provider Care Teams.  These Care Teams include your primary Cardiologist (physician) and Advanced Practice Providers (APPs -  Physician Assistants and Nurse Practitioners) who all work together to provide you with the care you need, when you need it.  We recommend signing up for the patient portal called "MyChart".  Sign up information is provided on this After Visit Summary.  MyChart is used to connect with patients for Virtual Visits (Telemedicine).  Patients are able to view lab/test results, encounter notes, upcoming appointments, etc.  Non-urgent messages can be sent to your provider as well.   To learn more about what you can do with MyChart, go to [email or web address].    Your next appointment:   3 week(s)  Provider:   Norman Herrlich, MD    Other Instructions

## 2022-08-24 NOTE — Progress Notes (Signed)
 " Cardiology Office Note:    Date:  08/27/2022   ID:  Nicholas Donnell Raddle., DOB [birth year], MRN [id number]  PCP:  Fernand Tracey LABOR, MD right   [facility] Providers Cardiologist:  Redell Leiter, MD     Referring MD: Fernand Tracey LABOR, MD   CC: follow up for tachycardia  History of Present Illness:    Nicholas Galicia. is a 72 y.o. male with a hx of CAD s/p CABG x 5, RBBB, congestive heart failure, paroxysmal atrial fibrillation, hypertension, DM2, CKD, hyperlipidemia w/ statin induced myopathy, carotid artery stenosis, .  On 07/10/2018 he was evaluated at [facility] for recent syncope and collapse while he was working outside, was passed out for approximately an hour.  Hindsight he recalls increasing chest pain over the previous week.  He had a Lexiscan  Cardiolite study, which showed an EF of 28% with global hypokinesis, small fixed defect noted in the anterior septal wall, ischemia noted in the anterior septal region and additional ischemia noted in inferior lateral wall mid to base.  Echo on 07/12/2018 revealed an EF of 40 to 45%, mild hypokinesis to the left ventricular entire inferolateral wall, biatrial mild dilatation, mild to moderate TR, mild thickening of the AV.  LHC on 07/15/2018 revealed ostial LM lesion 80% stenosed, proximal circumflex lesion 80% stenosed, proximal RCA lesion 100% stenosis with left-to-right collaterals, second diagonal lesion 90% stenosis.  Carotid ultrasound at this time revealed right ICA with 1 to 39% stenosis, left ICA 40 to 49% stenosis.  07/18/2018 he underwent CABG x 5 LIMA to LAD, SVG to PDA, SVG to Diagonal, and sequential SVG to Ramus Intermediate and OM.  On 07/21/2018 he went into A-fib with RVR, given amiodarone  bolus, IV Lopressor , he eventually converted to sinus rhythm.  He was evaluated by his PCP in May 2023 and found to be in atrial flutter, he was started on Eliquis  with a CHA2DS2-VASc score of 5 and advised to follow-up with [facility].  He was  evaluated by Lonell Bring, PA on 08/11/2021, and was doing well overall from a cardiac perspective and back in sinus rhythm. He wore a monitor in July 2023, which predominantly showed sinus rhythm, first-degree AV block, BBB was present, 8 runs of SVT the fastest interval lasting 5 beats with a rate 154 bpm.  Isolated SVE's were occasional, SVE couplets were rare, no episodes of atrial fibrillation or flutter. Echo completed on 09/11/2021 revealed an EF of 40 to 45%, mild concentric LVH, grade 3 DD, moderately elevated PASP, mild MR, mild TR.  Previously, unable to afford Entresto , MRA was not started due to potassium at the upper limits of normal.  Noted to be tachycardic, heart rate 128 bpm, EKG reviewed from their office revealed ST, LPFB, RBBB. Labs were checked, TSH, BNP, BMET normal. CBC revealed hemoglobin 12.9, hematocrit 38.  His metoprolol  was increased to 50 mg daily.  A monitor was arranged, review reveals he is in atrial flutter 100% of the time, heart rate ranging between 58 to 144 bpm, average 97 bpm.   He presents today for follow-up of his tachycardia.  His heart rate is controlled today however he is in atrial flutter.  His biggest complaint continues to be fatigue. He denies chest pain, palpitations, dyspnea, pnd, orthopnea, n, v, dizziness, syncope, edema, weight gain, or early satiety.   Past Medical History:  Diagnosis Date   Acute systolic CHF (congestive heart failure) (HCC)    CAD (coronary artery disease) 08/04/2018   Cardiomyopathy (  HCC) 07/11/2018   Coronary artery disease    a. 07/15/18 cardiac cath multivessel CAD, CT surgery consulted b.    Diabetes mellitus (HCC)    Hiatal hernia    Hx of CABG    a. 07/18/18 LIMA to LAD - SVG to diagonal 2 - SVG to Ramus and OM - SVG to PDA   Hyperlipidemia    Hypertension    NSTEMI (non-ST elevated myocardial infarction) (HCC) 07/11/2018   PAF (paroxysmal atrial fibrillation) (HCC)    a. briefly post op CABG 07/18/18 treated with  amiodarone    Right bundle branch block    Syncope    Syncope and collapse 07/11/2018    Past Surgical History:  Procedure Laterality Date   CARDIAC CATHETERIZATION     CARPAL TUNNEL RELEASE     CORONARY ARTERY BYPASS GRAFT N/A 07/18/2018   Procedure: CORONARY ARTERY BYPASS GRAFTING (CABG) X 5 USING LEFT INTERNAL MAMMARY ARTERY AND RIGHT SAPHENOUS VEIN HARVESTED ENDOSCOPICALLY;  Surgeon: Lucas Dorise POUR, MD;  Location: [facility] OR;  Service: Open Heart Surgery;  Laterality: N/A;   LEFT HEART CATH AND CORONARY ANGIOGRAPHY N/A 07/15/2018   Procedure: LEFT HEART CATH AND CORONARY ANGIOGRAPHY;  Surgeon: Nicholas Candyce RAMAN, MD;  Location: [facility] INVASIVE CV LAB;  Service: Cardiovascular;  Laterality: N/A;   TEE WITHOUT CARDIOVERSION N/A 07/18/2018   Procedure: TRANSESOPHAGEAL ECHOCARDIOGRAM (TEE);  Surgeon: Lucas Dorise POUR, MD;  Location: [facility] OR;  Service: Open Heart Surgery;  Laterality: N/A;   VASECTOMY      Current Medications: Current Meds  Medication Sig   Coenzyme Q10 (COQ10) 100 MG CAPS Take 100 mg by mouth daily.    ELIQUIS  5 MG TABS tablet Take 5 mg by mouth 2 (two) times daily.   gabapentin  (NEURONTIN ) 100 MG capsule Take 100-200 mg by mouth at bedtime as needed (pain).   JARDIANCE  25 MG TABS tablet Take 25 mg by mouth daily.   levocetirizine (XYZAL) 5 MG tablet Take 5 mg by mouth daily as needed for allergies.   metoprolol  tartrate (LOPRESSOR ) 50 MG tablet Take 50 mg by mouth 2 (two) times daily.   tadalafil (CIALIS) 20 MG tablet Take 20 mg by mouth daily as needed for erectile dysfunction.   traMADol  (ULTRAM ) 50 MG tablet Take 50 mg by mouth daily as needed for moderate pain.   valsartan  (DIOVAN ) 40 MG tablet Take 1 tablet (40 mg total) by mouth 2 (two) times daily.     Allergies:   Patient has no known allergies.   Social History   Socioeconomic History   Marital status: Divorced    Spouse name: Not on file   Number of children: Not on file   Years of education: Not on file    Highest education level: Not on file  Occupational History   Not on file  Tobacco Use   Smoking status: Former    Passive exposure: Past   Smokeless tobacco: Never  Vaping Use   Vaping Use: Never used  Substance and Sexual Activity   Alcohol use: Yes    Comment: beer occasionally   Drug use: Never   Sexual activity: Not on file  Other Topics Concern   Not on file  Social History Narrative   Not on file   Social Determinants of Health   Financial Resource Strain: Not on file  Food Insecurity: Not on file  Transportation Needs: Not on file  Physical Activity: Not on file  Stress: Not on file  Social Connections: Not on file  Family History: The patient's family history includes CAD in his mother; Macular degeneration in his mother; Obesity in his sister; Peripheral vascular disease in his father; Valvular heart disease in his mother.  ROS:   Please see the history of present illness.     All other systems reviewed and are negative.  EKGs/Labs/Other Studies Reviewed:    The following studies were reviewed today: Cardiac Studies & Procedures   CARDIAC CATHETERIZATION  CARDIAC CATHETERIZATION 07/15/2018  Narrative  Ost LM lesion is 80% stenosed.  Prox Cx lesion is 80% stenosed.  Prox RCA lesion is 100% stenosed. Left to right collaterals.  Prox LAD to Mid LAD lesion is 25% stenosed.  2nd Diag lesion is 90% stenosed.  LV end diastolic pressure is normal.  There is no aortic valve stenosis.  Plan for surgery consult.  Move to stepdown.  Findings Coronary Findings Diagnostic  Dominance: Right  Left Main Ost LM lesion is 80% stenosed. The lesion is eccentric, irregular and ulcerative.  Left Anterior Descending Prox LAD to Mid LAD lesion is 25% stenosed.  Second Diagonal Branch 2nd Diag lesion is 90% stenosed.  Left Circumflex Prox Cx lesion is 80% stenosed.  Right Coronary Artery Prox RCA lesion is 100% stenosed.  Right Posterior Descending  Artery Collaterals RPDA filled by collaterals from 3rd Sept.  Intervention  No interventions have been documented.   STRESS TESTS  MYOCARDIAL PERFUSION IMAGING 07/24/2018   ECHOCARDIOGRAM  ECHOCARDIOGRAM COMPLETE 09/11/2021  Narrative ECHOCARDIOGRAM REPORT    Patient Name:   Sephiroth Mcluckie. Date of Exam: 09/11/2021 Medical Rec #:  [id number]         Height:       70.0 in Accession #:    [id number]        Weight:       178.6 lb Date of Birth:  [birth year]         BSA:          1.989 m Patient Age:    71 years          BP:           118/62 mmHg Patient Gender: M                 HR:           83 bpm. Exam Location:  [place]  Procedure: 2D Echo, Cardiac Doppler, Color Doppler, Strain Analysis and Intracardiac Opacification Agent  Indications:    Cardiomyopathy-Ischemic I25.5  History:        Patient has prior history of Echocardiogram examinations, most recent 01/09/2021. Arrythmias:Atrial Fibrillation and RBBB; Risk Factors:Dyslipidemia and Hypertension.  Sonographer:    Lynwood Silvas RDCS Referring Phys: [phone] [facility] N DUNN   Sonographer Comments: Suboptimal apical window. IMPRESSIONS   1. Left ventricular ejection fraction, by estimation, is 40 to 45%. The left ventricle has mildly decreased function. The left ventricle demonstrates global hypokinesis. There is mild concentric left ventricular hypertrophy. Left ventricular diastolic parameters are consistent with Grade III diastolic dysfunction (restrictive). Elevated left atrial pressure. 2. Right ventricular systolic function is mildly reduced. The right ventricular size is normal. There is moderately elevated pulmonary artery systolic pressure. 3. Right atrial size was moderately dilated. 4. The mitral valve is normal in structure. Mild mitral valve regurgitation. No evidence of mitral stenosis. 5. The aortic valve is tricuspid. Aortic valve regurgitation is not visualized. Aortic valve sclerosis is present, with no  evidence of aortic valve stenosis. 6. Aortic Normal DTA. 7. The inferior vena  cava is normal in size with greater than 50% respiratory variability, suggesting right atrial pressure of 3 mmHg.  FINDINGS Left Ventricle: Left ventricular ejection fraction, by estimation, is 40 to 45%. The left ventricle has mildly decreased function. The left ventricle demonstrates global hypokinesis. Definity  contrast agent was given IV to delineate the left ventricular endocardial borders. The left ventricular internal cavity size was normal in size. There is mild concentric left ventricular hypertrophy. Left ventricular diastolic parameters are consistent with Grade III diastolic dysfunction (restrictive). Elevated left atrial pressure.  Right Ventricle: The right ventricular size is normal. No increase in right ventricular wall thickness. Right ventricular systolic function is mildly reduced. There is moderately elevated pulmonary artery systolic pressure. The tricuspid regurgitant velocity is 3.58 m/s, and with an assumed right atrial pressure of 3 mmHg, the estimated right ventricular systolic pressure is 54.3 mmHg.  Left Atrium: Left atrial size was normal in size.  Right Atrium: Right atrial size was moderately dilated.  Pericardium: There is no evidence of pericardial effusion.  Mitral Valve: The mitral valve is normal in structure. Mild mitral valve regurgitation. No evidence of mitral valve stenosis.  Tricuspid Valve: The tricuspid valve is normal in structure. Tricuspid valve regurgitation is mild . No evidence of tricuspid stenosis.  Aortic Valve: The aortic valve is tricuspid. Aortic valve regurgitation is not visualized. Aortic valve sclerosis is present, with no evidence of aortic valve stenosis.  Pulmonic Valve: The pulmonic valve was normal in structure. Pulmonic valve regurgitation is not visualized. No evidence of pulmonic stenosis.  Aorta: The aortic root and ascending aorta are  structurally normal, with no evidence of dilitation, the aortic arch was not well visualized and Normal DTA.  Venous: The pulmonary veins were not well visualized. The inferior vena cava is normal in size with greater than 50% respiratory variability, suggesting right atrial pressure of 3 mmHg.  IAS/Shunts: No atrial level shunt detected by color flow Doppler.   LEFT VENTRICLE PLAX 2D LVIDd:         4.80 cm     Diastology LV PW:         1.20 cm     LV e' medial:    3.65 cm/s LV IVS:        1.20 cm     LV E/e' medial:  28.5 LVOT diam:     2.00 cm     LV e' lateral:   6.89 cm/s LV SV:         48          LV E/e' lateral: 15.1 LV SV Index:   24 LVOT Area:     3.14 cm  LV Volumes (MOD) LV vol d, MOD A2C: 58.5 ml LV vol d, MOD A4C: 72.9 ml LV vol s, MOD A2C: 33.8 ml LV vol s, MOD A4C: 32.7 ml LV SV MOD A2C:     24.7 ml LV SV MOD A4C:     72.9 ml LV SV MOD BP:      31.6 ml  RIGHT VENTRICLE RV S prime:     6.41 cm/s TAPSE (M-mode): 1.6 cm  LEFT ATRIUM             Index        RIGHT ATRIUM           Index LA diam:        3.20 cm 1.61 cm/m   RA Area:     19.50 cm LA Vol (A2C):   73.9 ml  37.15 ml/m  RA Volume:   51.40 ml  25.84 ml/m LA Vol (A4C):   80.2 ml 40.32 ml/m LA Biplane Vol: 84.1 ml 42.28 ml/m AORTIC VALVE LVOT Vmax:   73.20 cm/s LVOT Vmean:  51.900 cm/s LVOT VTI:    0.153 m  AORTA Ao Root diam: 3.70 cm Ao Asc diam:  3.50 cm Ao Desc diam: 1.90 cm  MITRAL VALVE                  TRICUSPID VALVE MV Area (PHT): 5.54 cm       TR Peak grad:   51.3 mmHg MV Decel Time: 137 msec       TR Vmax:        358.00 cm/s MR Peak grad:    86.5 mmHg MR Mean grad:    61.0 mmHg    SHUNTS MR Vmax:         465.00 cm/s  Systemic VTI:  0.15 m MR Vmean:        368.0 cm/s   Systemic Diam: 2.00 cm MR PISA:         2.26 cm MR PISA Eff ROA: 19 mm MR PISA Radius:  0.60 cm MV E velocity: 104.00 cm/s MV A velocity: 32.50 cm/s MV E/A ratio:  3.20  Redell Leiter MD Electronically  signed by Redell Leiter MD Signature Date/Time: 09/11/2021/4:51:54 PM    Final   TEE  ECHO INTRAOPERATIVE TEE 07/18/2018  Interpretation Summary   Left ventricle: Normal cavity size and wall thickness. Wall motion is abnormal.   Left atrium: Cavity is dilated and dilated.   Aortic valve: No stenosis.   Mitral valve:  Mild to moderate regurgitation.   Right ventricle:  Normal cavity size, wall thickness and ejection fraction.   Right atrium:  Cavity is dilated.   Tricuspid valve: Mild regurgitation.   MONITORS  LONG TERM MONITOR (3-14 DAYS) 08/30/2021  Narrative Patch Wear Time:  6 days and 9 hours (2023-06-27T08:34:16-0400 to 2023-07-03T18:13:14-0400)  Patient had a min HR of 57 bpm, max HR of 154 bpm, and avg HR of 77 bpm. Predominant underlying rhythm was Sinus Rhythm. First Degree AV Block was present. Bundle Branch Block/IVCD was present.  8 Supraventricular Tachycardia runs occurred, the run with the fastest interval lasting 5 beats with a max rate of 154 bpm, the longest lasting 8 beats with an avg rate of 101 bpm.  Isolated SVEs were occasional (1.6%, 11055), SVE Couplets were rare (<1.0%, 3033), and no SVE Triplets were present. There were no episodes of atrial fibrillation or flutter.  There were no episodes of sinus pauses secondary third-degree AV block or sinus node exit block.  Isolated VEs were rare (<1.0%), VE Couplets were rare (<1.0%), and no VE Triplets were present.  There is 1 single triggered event sinus rhythm            EKG: Atrial flutter  EKG Interpretation Date/Time:  Monday August 27 2022 13:58:00 EDT Ventricular Rate:  88 PR Interval:    QRS Duration:  156 QT Interval:  400 QTC Calculation: 484 R Axis:   133  Text Interpretation: Atrial flutter with variable A-V block Right bundle branch block Septal infarct , age undetermined T wave abnormality, consider lateral ischemia Abnormal ECG When compared with ECG of 21-Jul-2018 05:28, Atrial flutter  has replaced Atrial fibrillation Vent. rate has decreased BY  47 BPM Septal infarct is now Present T wave inversion now evident in Anterior leads Confirmed by Carlin Nest [phone]) on  08/27/2022 3:29:31 PM      Recent Labs: No results found for requested labs within last 365 days.  Recent Lipid Panel    Component Value Date/Time   CHOL 99 (L) 09/24/2018 1426   TRIG 110 09/24/2018 1426   HDL 40 09/24/2018 1426   CHOLHDL 2.5 09/24/2018 1426   CHOLHDL 4.5 07/12/2018 0350   VLDL 74 (H) 07/12/2018 0350   LDLCALC 37 09/24/2018 1426     Risk Assessment/Calculations:    CHA2DS2-VASc Score = 5   This indicates a 7.2% annual risk of stroke. The patient's score is based upon: CHF History: 1 HTN History: 1 Diabetes History: 1 Stroke History: 0 Vascular Disease History: 1 Age Score: 1 Gender Score: 0               Physical Exam:    VS:  BP 122/82 (BP Location: Right Arm, Patient Position: Sitting, Cuff Size: Normal)   Pulse 88   Ht 5' 10 (1.778 m)   Wt 176 lb 3.2 oz (79.9 kg)   SpO2 95%   BMI 25.28 kg/m     Wt Readings from Last 3 Encounters:  08/27/22 176 lb 3.2 oz (79.9 kg)  08/03/22 175 lb 6.4 oz (79.6 kg)  06/14/22 177 lb (80.3 kg)     GEN:  Well nourished, well developed in no acute distress HEENT: Normal NECK: No JVD; No carotid bruits LYMPHATICS: No lymphadenopathy CARDIAC: RRR, no murmurs, rubs, gallops RESPIRATORY:  Clear to auscultation without rales, wheezing or rhonchi  ABDOMEN: Soft, non-tender, non-distended MUSCULOSKELETAL: no edema; No deformity  SKIN: Warm and dry NEUROLOGIC:  Alert and oriented x 3 PSYCHIATRIC:  Normal affect   ASSESSMENT:    1. PAF (paroxysmal atrial fibrillation) (HCC)   2. Pre-procedure lab exam      PLAN:    In order of problems listed above:  CAD-CABG x 5 in 2020, Stable with no anginal symptoms. No indication for ischemic evaluation.  Continue metoprolol  50 mg twice daily. On Eliquis  therefore he is not on  aspirin . Paroxysmal atrial fibrillation/atrial flutter/hypercoagulable state-he is in atrial flutter today, CHA2DS2-VASc score of 5, continue Eliquis  5 mg twice daily--no indication for dose reduction.  Continue metoprolol  50 mg twice daily.  Denies hematochezia, hematuria, hemoptysis. He has not had any missed doses of Eliquis .  We discussed DCCV, including benefits/risks, answered all questions to his satisfaction and he would like to proceed with cardioversion. Will check CBC, BMET in preparation for DCCV. Palpitations/tachycardia-monitor revealed persistent atrial flutter, continue metoprolol  50 mg twice daily, proceeding with DCCV as outlined above. Ischemic cardiomyopathy/combined heart failure-NYHA class I, euvolemic.  Echo on 09/11/2021 revealed an EF of 40 to 45%, continue metoprolol  50 mg twice daily, Jardiance  25 mg daily--although DM dosing, valsartan  40 mg twice daily. Hyperlipidemia-has a history of statin induced myopathy, not clear who is monitoring his lipids currently.  Will evaluate at next visit. Hypertension-blood pressure is well-controlled today at 122/82, continue metoprolol  50 mg twice a day, continue valsartan  40 mg twice daily.  Disposition-CBC, BMET, proceed with DCCV, return in 3 weeks.        Medication Adjustments/Labs and Tests Ordered: Current medicines are reviewed at length with the patient today.  Concerns regarding medicines are outlined above.  Orders Placed This Encounter  Procedures   Basic metabolic panel   CBC with Differential/Platelet   EKG 12-Lead   No orders of the defined types were placed in this encounter.   Patient Instructions  Medication Instructions:  Your physician  recommends that you continue on your current medications as directed. Please refer to the Current Medication list given to you today.  *If you need a refill on your cardiac medications before your next appointment, please call your pharmacy*   Lab Work: Your physician  recommends that you return for lab work in: Today for CBC and BMP   If you have labs (blood work) drawn today and your tests are completely normal, you will receive your results only by: MyChart Message (if you have MyChart) OR A paper copy in the mail If you have any lab test that is abnormal or we need to change your treatment, we will call you to review the results.   Testing/Procedures: Your physician has recommended that you have a Cardioversion (DCCV). Electrical Cardioversion uses a jolt of electricity to your heart either through paddles or wired patches attached to your chest. This is a controlled, usually prescheduled, procedure. Defibrillation is done under light anesthesia in the hospital, and you usually go home the day of the procedure. This is done to get your heart back into a normal rhythm. You are not awake for the procedure.   Diagnosis: Paroxysmal Atrial Fibrillation  DATE AND TIME OF PROCEDURE: Thursday, July 11th  Please register at the Admitting Department at: TBD  DO NOT EAT OR DRINK ANYTHING after midnight prior to your procedure.  You should take your medications as usual with a sip of water.  If you are diabetic, DO NOT TAKE YOUR DIABETIC MEDICATIONS the morning OF THE PROCEDURE.        Hold Jardiance  for 3 days prior to procedure  DO NOT STOP or miss any doses of your  Eliquis  blood thinners that you are taking.  If you miss a dose of this medication let us  know as soon as possible as we will need to reschedule your procedure. Missing a dose of the blood thinner and continuing with the cardioversion places you at greater risk for having a stroke.  Have your blood drawn as intructed.  You will need someone with you to drive you home after the procedure.    Follow-Up: At [facility], you and your health needs are our priority.  As part of our continuing mission to provide you with exceptional heart care, we have created designated Provider Care Teams.   These Care Teams include your primary Cardiologist (physician) and Advanced Practice Providers (APPs -  Physician Assistants and Nurse Practitioners) who all work together to provide you with the care you need, when you need it.  We recommend signing up for the patient portal called MyChart.  Sign up information is provided on this After Visit Summary.  MyChart is used to connect with patients for Virtual Visits (Telemedicine).  Patients are able to view lab/test results, encounter notes, upcoming appointments, etc.  Non-urgent messages can be sent to your provider as well.   To learn more about what you can do with MyChart, go to [email or web address].    Your next appointment:   2-3  week(s)  The format for your next appointment:   In Person  Provider:   Redell Leiter, MD   Other Instructions  Electrical Cardioversion Electrical cardioversion is the delivery of a jolt of electricity to restore a normal rhythm to the heart. A rhythm that is too fast or is not regular keeps the heart from pumping well. In this procedure, sticky patches or metal paddles are placed on the chest to deliver electricity to the heart from a  device. This procedure may be done in an emergency if: There is low or no blood pressure as a result of the heart rhythm. Normal rhythm must be restored as fast as possible to protect the brain and heart from further damage. It may save a life. This may also be a scheduled procedure for irregular or fast heart rhythms that are not immediately life-threatening. Tell a health care provider about: Any allergies you have. All medicines you are taking, including vitamins, herbs, eye drops, creams, and over-the-counter medicines. Any problems you or family members have had with anesthetic medicines. Any blood disorders you have. Any surgeries you have had. Any medical conditions you have. Whether you are pregnant or may be pregnant. What are the risks? Generally, this is a  safe procedure. However, problems may occur, including: Allergic reactions to medicines. A blood clot that breaks free and travels to other parts of your body. The possible return of an abnormal heart rhythm within hours or days after the procedure. Your heart stopping (cardiac arrest). This is rare. What happens before the procedure? Medicines Your health care provider may have you start taking: Blood-thinning medicines (anticoagulants) so your blood does not clot as easily. Medicines to help stabilize your heart rate and rhythm. Ask your health care provider about: Changing or stopping your regular medicines. This is especially important if you are taking diabetes medicines or blood thinners. Taking medicines such as aspirin  and ibuprofen. These medicines can thin your blood. Do not take these medicines unless your health care provider tells you to take them. Taking over-the-counter medicines, vitamins, herbs, and supplements. General instructions Follow instructions from your health care provider about eating or drinking restrictions. Plan to have someone take you home from the hospital or clinic. If you will be going home right after the procedure, plan to have someone with you for 24 hours. Ask your health care provider what steps will be taken to help prevent infection. These may include washing your skin with a germ-killing soap. What happens during the procedure? An IV will be inserted into one of your veins. Sticky patches (electrodes) or metal paddles may be placed on your chest. You will be given a medicine to help you relax (sedative). An electrical shock will be delivered. The procedure may vary among health care providers and hospitals.    What can I expect after the procedure? Your blood pressure, heart rate, breathing rate, and blood oxygen level will be monitored until you leave the hospital or clinic. Your heart rhythm will be watched to make sure it does not  change. You may have some redness on the skin where the shocks were given. Follow these instructions at home: Do not drive for 24 hours if you were given a sedative during your procedure. Take over-the-counter and prescription medicines only as told by your health care provider. Ask your health care provider how to check your pulse. Check it often. Rest for 48 hours after the procedure or as told by your health care provider. Avoid or limit your caffeine use as told by your health care provider. Keep all follow-up visits as told by your health care provider. This is important. Contact a health care provider if: You feel like your heart is beating too quickly or your pulse is not regular. You have a serious muscle cramp that does not go away. Get help right away if: You have discomfort in your chest. You are dizzy or you feel faint. You have trouble breathing or you  are short of breath. Your speech is slurred. You have trouble moving an arm or leg on one side of your body. Your fingers or toes turn cold or blue. Summary Electrical cardioversion is the delivery of a jolt of electricity to restore a normal rhythm to the heart. This procedure may be done right away in an emergency or may be a scheduled procedure if the condition is not an emergency. Generally, this is a safe procedure. After the procedure, check your pulse often as told by your health care provider. This information is not intended to replace advice given to you by your health care provider. Make sure you discuss any questions you have with your health care provider. Document Revised: 09/08/2018 Document Reviewed: 09/08/2018 Elsevier Patient Education  [address]     Signed, Delon JAYSON Hoover, NP  08/27/2022 3:30 PM    [facility] "

## 2022-08-27 ENCOUNTER — Ambulatory Visit: Payer: PPO | Attending: Cardiology | Admitting: Cardiology

## 2022-08-27 ENCOUNTER — Encounter: Payer: Self-pay | Admitting: Cardiology

## 2022-08-27 VITALS — BP 122/82 | HR 88 | Ht 70.0 in | Wt 176.2 lb

## 2022-08-27 DIAGNOSIS — E785 Hyperlipidemia, unspecified: Secondary | ICD-10-CM | POA: Diagnosis not present

## 2022-08-27 DIAGNOSIS — I480 Paroxysmal atrial fibrillation: Secondary | ICD-10-CM

## 2022-08-27 DIAGNOSIS — I2510 Atherosclerotic heart disease of native coronary artery without angina pectoris: Secondary | ICD-10-CM

## 2022-08-27 DIAGNOSIS — I4892 Unspecified atrial flutter: Secondary | ICD-10-CM | POA: Diagnosis not present

## 2022-08-27 DIAGNOSIS — Z01812 Encounter for preprocedural laboratory examination: Secondary | ICD-10-CM

## 2022-08-27 DIAGNOSIS — G720 Drug-induced myopathy: Secondary | ICD-10-CM

## 2022-08-27 DIAGNOSIS — T466X5A Adverse effect of antihyperlipidemic and antiarteriosclerotic drugs, initial encounter: Secondary | ICD-10-CM

## 2022-08-27 DIAGNOSIS — I10 Essential (primary) hypertension: Secondary | ICD-10-CM

## 2022-08-27 NOTE — Patient Instructions (Addendum)
Medication Instructions:  Your physician recommends that you continue on your current medications as directed. Please refer to the Current Medication list given to you today.  *If you need a refill on your cardiac medications before your next appointment, please call your pharmacy*   Lab Work: Your physician recommends that you return for lab work in: Today for CBC and BMP   If you have labs (blood work) drawn today and your tests are completely normal, you will receive your results only by: MyChart Message (if you have MyChart) OR A paper copy in the mail If you have any lab test that is abnormal or we need to change your treatment, we will call you to review the results.   Testing/Procedures: Your physician has recommended that you have a Cardioversion (DCCV). Electrical Cardioversion uses a jolt of electricity to your heart either through paddles or wired patches attached to your chest. This is a controlled, usually prescheduled, procedure. Defibrillation is done under light anesthesia in the hospital, and you usually go home the day of the procedure. This is done to get your heart back into a normal rhythm. You are not awake for the procedure.   Diagnosis: Paroxysmal Atrial Fibrillation  DATE AND TIME OF PROCEDURE: Thursday, July 11th  Please register at the Admitting Department at: TBD  DO NOT EAT OR DRINK ANYTHING after midnight prior to your procedure.  You should take your medications as usual with a sip of water.  If you are diabetic, DO NOT TAKE YOUR DIABETIC MEDICATIONS the morning OF THE PROCEDURE.        Hold Jardiance for 3 days prior to procedure  DO NOT STOP or miss any doses of your  Eliquis blood thinners that you are taking.  If you miss a dose of this medication let us know as soon as possible as we will need to reschedule your procedure. Missing a dose of the blood thinner and continuing with the cardioversion places you at greater risk for having a stroke.  Have  your blood drawn as intructed.  You will need someone with you to drive you home after the procedure.    Follow-Up: At [facility], you and your health needs are our priority.  As part of our continuing mission to provide you with exceptional heart care, we have created designated Provider Care Teams.  These Care Teams include your primary Cardiologist (physician) and Advanced Practice Providers (APPs -  Physician Assistants and Nurse Practitioners) who all work together to provide you with the care you need, when you need it.  We recommend signing up for the patient portal called "MyChart".  Sign up information is provided on this After Visit Summary.  MyChart is used to connect with patients for Virtual Visits (Telemedicine).  Patients are able to view lab/test results, encounter notes, upcoming appointments, etc.  Non-urgent messages can be sent to your provider as well.   To learn more about what you can do with MyChart, go to [email or web address].    Your next appointment:   2-3  week(s)  The format for your next appointment:   In Person  Provider:   Norman Herrlich, MD   Other Instructions  Electrical Cardioversion Electrical cardioversion is the delivery of a jolt of electricity to restore a normal rhythm to the heart. A rhythm that is too fast or is not regular keeps the heart from pumping well. In this procedure, sticky patches or metal paddles are placed on the chest to deliver electricity to  the heart from a device. This procedure may be done in an emergency if: There is low or no blood pressure as a result of the heart rhythm. Normal rhythm must be restored as fast as possible to protect the brain and heart from further damage. It may save a life. This may also be a scheduled procedure for irregular or fast heart rhythms that are not immediately life-threatening. Tell a health care provider about: Any allergies you have. All medicines you are taking, including  vitamins, herbs, eye drops, creams, and over-the-counter medicines. Any problems you or family members have had with anesthetic medicines. Any blood disorders you have. Any surgeries you have had. Any medical conditions you have. Whether you are pregnant or may be pregnant. What are the risks? Generally, this is a safe procedure. However, problems may occur, including: Allergic reactions to medicines. A blood clot that breaks free and travels to other parts of your body. The possible return of an abnormal heart rhythm within hours or days after the procedure. Your heart stopping (cardiac arrest). This is rare. What happens before the procedure? Medicines Your health care provider may have you start taking: Blood-thinning medicines (anticoagulants) so your blood does not clot as easily. Medicines to help stabilize your heart rate and rhythm. Ask your health care provider about: Changing or stopping your regular medicines. This is especially important if you are taking diabetes medicines or blood thinners. Taking medicines such as aspirin and ibuprofen. These medicines can thin your blood. Do not take these medicines unless your health care provider tells you to take them. Taking over-the-counter medicines, vitamins, herbs, and supplements. General instructions Follow instructions from your health care provider about eating or drinking restrictions. Plan to have someone take you home from the hospital or clinic. If you will be going home right after the procedure, plan to have someone with you for 24 hours. Ask your health care provider what steps will be taken to help prevent infection. These may include washing your skin with a germ-killing soap. What happens during the procedure? An IV will be inserted into one of your veins. Sticky patches (electrodes) or metal paddles may be placed on your chest. You will be given a medicine to help you relax (sedative). An electrical shock will be  delivered. The procedure may vary among health care providers and hospitals.    What can I expect after the procedure? Your blood pressure, heart rate, breathing rate, and blood oxygen level will be monitored until you leave the hospital or clinic. Your heart rhythm will be watched to make sure it does not change. You may have some redness on the skin where the shocks were given. Follow these instructions at home: Do not drive for 24 hours if you were given a sedative during your procedure. Take over-the-counter and prescription medicines only as told by your health care provider. Ask your health care provider how to check your pulse. Check it often. Rest for 48 hours after the procedure or as told by your health care provider. Avoid or limit your caffeine use as told by your health care provider. Keep all follow-up visits as told by your health care provider. This is important. Contact a health care provider if: You feel like your heart is beating too quickly or your pulse is not regular. You have a serious muscle cramp that does not go away. Get help right away if: You have discomfort in your chest. You are dizzy or you feel faint. You have  trouble breathing or you are short of breath. Your speech is slurred. You have trouble moving an arm or leg on one side of your body. Your fingers or toes turn cold or blue. Summary Electrical cardioversion is the delivery of a jolt of electricity to restore a normal rhythm to the heart. This procedure may be done right away in an emergency or may be a scheduled procedure if the condition is not an emergency. Generally, this is a safe procedure. After the procedure, check your pulse often as told by your health care provider. This information is not intended to replace advice given to you by your health care provider. Make sure you discuss any questions you have with your health care provider. Document Revised: 09/08/2018 Document Reviewed:  09/08/2018 Elsevier Patient Education  2021 [employer].

## 2022-08-28 ENCOUNTER — Telehealth: Payer: Self-pay | Admitting: *Deleted

## 2022-08-28 LAB — CBC WITH DIFFERENTIAL/PLATELET
Basophils Absolute: 0 x10E3/uL (ref 0.0–0.2)
Basos: 1 %
EOS (ABSOLUTE): 0.1 x10E3/uL (ref 0.0–0.4)
Eos: 2 %
Hematocrit: 47.5 % (ref 37.5–51.0)
Hemoglobin: 14.8 g/dL (ref 13.0–17.7)
Immature Grans (Abs): 0 x10E3/uL (ref 0.0–0.1)
Immature Granulocytes: 0 %
Lymphocytes Absolute: 1.7 x10E3/uL (ref 0.7–3.1)
Lymphs: 26 %
MCH: 27.4 pg (ref 26.6–33.0)
MCHC: 31.2 g/dL — ABNORMAL LOW (ref 31.5–35.7)
MCV: 88 fL (ref 79–97)
Monocytes Absolute: 0.8 x10E3/uL (ref 0.1–0.9)
Monocytes: 11 %
Neutrophils Absolute: 4 x10E3/uL (ref 1.4–7.0)
Neutrophils: 60 %
Platelets: 176 x10E3/uL (ref 150–450)
RBC: 5.41 x10E6/uL (ref 4.14–5.80)
RDW: 17.4 % — ABNORMAL HIGH (ref 11.6–15.4)
WBC: 6.7 x10E3/uL (ref 3.4–10.8)

## 2022-08-28 LAB — BASIC METABOLIC PANEL WITH GFR
BUN/Creatinine Ratio: 17 (ref 10–24)
BUN: 19 mg/dL (ref 8–27)
CO2: 20 mmol/L (ref 20–29)
Calcium: 9.9 mg/dL (ref 8.6–10.2)
Chloride: 94 mmol/L — ABNORMAL LOW (ref 96–106)
Creatinine, Ser: 1.11 mg/dL (ref 0.76–1.27)
Glucose: 454 mg/dL — ABNORMAL HIGH (ref 70–99)
Potassium: 4.7 mmol/L (ref 3.5–5.2)
Sodium: 134 mmol/L (ref 134–144)
eGFR: 70 mL/min/1.73

## 2022-08-28 NOTE — Telephone Encounter (Signed)
-----   Message from [name], NP sent at 08/28/2022  7:53 AM EDT ----- Lab work is stable. Blood sugar was very high. Can we see if he has seen Dr. Welton Flakes for his DM recently? If not, he needs to f/u with him.

## 2022-08-28 NOTE — Telephone Encounter (Signed)
Faxed paperwork to [facility] for Cardioversion scheduled for 08/30/22

## 2022-08-28 NOTE — Telephone Encounter (Signed)
Gave pt lab results. Pt stated it is probably time he made appt with Dr. Welton Mills to follow up on his diabetes. He stated he would do that very soon. Pt had no further questions

## 2022-08-28 NOTE — Telephone Encounter (Signed)
Left message on voice mail to call us back.

## 2022-08-29 ENCOUNTER — Telehealth: Payer: Self-pay | Admitting: Cardiology

## 2022-08-29 NOTE — Telephone Encounter (Signed)
New Message:      Patient ws calling to see about his procedure. He said he had not heard anything .

## 2022-08-29 NOTE — Telephone Encounter (Signed)
Advised pt that the Hospital usually calls in the afternoon to go over Cardioversion prep. Pt will call back if he has not heard anything by 4pm

## 2022-08-30 DIAGNOSIS — Z87891 Personal history of nicotine dependence: Secondary | ICD-10-CM | POA: Diagnosis not present

## 2022-08-30 DIAGNOSIS — Z951 Presence of aortocoronary bypass graft: Secondary | ICD-10-CM | POA: Diagnosis not present

## 2022-08-30 DIAGNOSIS — I4891 Unspecified atrial fibrillation: Secondary | ICD-10-CM | POA: Diagnosis not present

## 2022-08-30 DIAGNOSIS — E1122 Type 2 diabetes mellitus with diabetic chronic kidney disease: Secondary | ICD-10-CM | POA: Diagnosis not present

## 2022-08-30 DIAGNOSIS — I252 Old myocardial infarction: Secondary | ICD-10-CM | POA: Diagnosis not present

## 2022-08-30 DIAGNOSIS — I440 Atrioventricular block, first degree: Secondary | ICD-10-CM | POA: Diagnosis not present

## 2022-08-30 DIAGNOSIS — N189 Chronic kidney disease, unspecified: Secondary | ICD-10-CM | POA: Diagnosis not present

## 2022-08-30 DIAGNOSIS — I130 Hypertensive heart and chronic kidney disease with heart failure and stage 1 through stage 4 chronic kidney disease, or unspecified chronic kidney disease: Secondary | ICD-10-CM | POA: Diagnosis not present

## 2022-08-30 DIAGNOSIS — I5032 Chronic diastolic (congestive) heart failure: Secondary | ICD-10-CM | POA: Diagnosis not present

## 2022-08-30 DIAGNOSIS — I4892 Unspecified atrial flutter: Secondary | ICD-10-CM | POA: Diagnosis not present

## 2022-08-30 DIAGNOSIS — I2510 Atherosclerotic heart disease of native coronary artery without angina pectoris: Secondary | ICD-10-CM | POA: Diagnosis not present

## 2022-08-30 DIAGNOSIS — I480 Paroxysmal atrial fibrillation: Secondary | ICD-10-CM | POA: Diagnosis not present

## 2022-08-30 DIAGNOSIS — I4510 Unspecified right bundle-branch block: Secondary | ICD-10-CM | POA: Diagnosis not present

## 2022-09-10 DIAGNOSIS — G720 Drug-induced myopathy: Secondary | ICD-10-CM | POA: Insufficient documentation

## 2022-09-10 HISTORY — DX: Drug-induced myopathy: G72.0

## 2022-09-13 DIAGNOSIS — D649 Anemia, unspecified: Secondary | ICD-10-CM | POA: Diagnosis not present

## 2022-09-13 DIAGNOSIS — E785 Hyperlipidemia, unspecified: Secondary | ICD-10-CM | POA: Diagnosis not present

## 2022-09-13 DIAGNOSIS — I480 Paroxysmal atrial fibrillation: Secondary | ICD-10-CM | POA: Diagnosis not present

## 2022-09-13 DIAGNOSIS — M2550 Pain in unspecified joint: Secondary | ICD-10-CM | POA: Diagnosis not present

## 2022-09-13 DIAGNOSIS — J309 Allergic rhinitis, unspecified: Secondary | ICD-10-CM | POA: Diagnosis not present

## 2022-09-13 DIAGNOSIS — E782 Mixed hyperlipidemia: Secondary | ICD-10-CM | POA: Diagnosis not present

## 2022-09-13 DIAGNOSIS — E1169 Type 2 diabetes mellitus with other specified complication: Secondary | ICD-10-CM | POA: Diagnosis not present

## 2022-09-13 NOTE — Progress Notes (Signed)
Cardiology Office Note:    Date:  09/14/2022   ID:  Nicholas Solian., DOB 1951/01/09, MRN [id number]  PCP:  Lise Auer, MD  Cardiologist:  Norman Herrlich, MD    Referring MD: Lise Auer, MD    ASSESSMENT:    1. Atrial flutter, unspecified type (HCC)   2. Chronic anticoagulation   3. CAD in native artery   4. Ischemic cardiomyopathy   5. Hypertensive heart disease with heart failure (HCC)   6. Hyperlipidemia LDL goal <70   7. RBBB (right bundle branch block)    PLAN:    In order of problems listed above:  Maintaining sinus rhythm with beta-blocker I think he should see EP and likely best served by atrial fibrillation ablation versus antiarrhythmic drug therapy with CAD LV dysfunction and bifascicular heart block Continue his anticoagulant Stable CAD Continue his antihypertensive agent Currently not on lipid-lowering treatment   Next appointment: See me in 3 months   Medication Adjustments/Labs and Tests Ordered: Current medicines are reviewed at length with the patient today.  Concerns regarding medicines are outlined above.  Orders Placed This Encounter  Procedures   EKG 12-Lead   No orders of the defined types were placed in this encounter.    History of Present Illness:    Nicholas Moeller. is a 72 y.o. male with a hx of CAD with CABG 07/18/2018 initially with severe LV dysfunction EF 28% prior to bypass surgery subsequent improved 40 to 45% right bundle branch block brief postoperative atrial fibrillation treated with amiodarone hypertension hyperlipidemia and type 2 diabetes last seen 12/04/2021.  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  He underwent elective cardioversion 08/30/2022 successful resuming sinus rhythm.  EKG July 2023 showed EF of 40 to 45% with mild LVH elevated left atrial  pressure.  Compliance with diet, lifestyle and medications: Yes  Although he was not very aware of atrial fibrillation initially he feels much better in the last 2 weeks Purchase the mobile Kardia device and screen his heart rhythm daily He does not want to go through another cardioversion he does drink more than moderate alcohol and asked him to continue to abstain I think he benefit from a strategy to prevent recurrent atrial fibrillation in view of his history of coronary disease LV dysfunction and bifascicular heart block I would like him to see EP and see if he is best suited to atrial fibrillation ablation versus antiarrhythmic drug therapy. He will continue anticoagulation He has no edema shortness of breath chest pain palpitation or syncope His EKG confirms sinus rhythm with sinus arrhythmia and bifascicular heart block Past Medical History:  Diagnosis Date   Abnormal myocardial perfusion study 07/11/2018   Acute systolic CHF (congestive heart failure) (HCC)    CAD (coronary artery disease) 08/04/2018   Cardiomyopathy (HCC) 07/11/2018   CKD (chronic kidney disease) 08/04/2018   Coronary artery disease    a. 07/15/18 cardiac cath multivessel CAD, CT surgery consulted b.    Diabetes mellitus without complication (HCC)    Dyslipidemia associated with type 2 diabetes mellitus (HCC) 07/11/2018   Hiatal hernia    Hx of CABG    a. 07/18/18 LIMA to LAD - SVG to diagonal 2 - SVG to Ramus and OM - SVG to PDA   Hyperlipidemia    Hypertension    NSTEMI (non-ST elevated myocardial  infarction) (HCC) 07/11/2018   On amiodarone therapy 08/04/2018   PAF (paroxysmal atrial fibrillation) (HCC)    a. 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    ELIQUIS 5 MG TABS tablet Take 5 mg by mouth 2 (two) times daily.   gabapentin (NEURONTIN) 100 MG capsule Take 100-200 mg by mouth at bedtime as needed (pain).   JARDIANCE 25 MG TABS tablet Take 25 mg by mouth daily.   levocetirizine (XYZAL) 5 MG tablet Take 5 mg by mouth daily as needed for allergies.   metoprolol tartrate (LOPRESSOR) 50 MG tablet Take 50 mg by mouth 2 (two) times daily.   tadalafil (CIALIS) 20 MG tablet Take 20 mg by mouth daily as needed for erectile dysfunction.   traMADol (ULTRAM) 50 MG tablet Take 50 mg by mouth daily as needed for moderate pain.   valsartan (DIOVAN) 40 MG tablet Take 1 tablet (40 mg total) by mouth 2 (two) times daily.      EKGs/Labs/Other Studies Reviewed:    The following studies were reviewed today:  Cardiac Studies & Procedures   CARDIAC CATHETERIZATION  CARDIAC CATHETERIZATION 07/15/2018  Narrative  Ost LM lesion is 80% stenosed.  Prox Cx lesion is 80% stenosed.  Prox RCA lesion is 100% stenosed. Left to right collaterals.  Prox LAD to Mid LAD lesion is 25% stenosed.  2nd Diag lesion is 90% stenosed.  LV end diastolic pressure is normal.  There is no aortic valve stenosis.  Plan for surgery consult.  Move to stepdown.  Findings Coronary Findings Diagnostic  Dominance: Right  Left Main Ost LM lesion is 80% stenosed. The lesion is eccentric, irregular and ulcerative.  Left Anterior Descending Prox LAD to Mid LAD lesion is 25% stenosed.  Second Diagonal Branch 2nd Diag lesion is 90% stenosed.  Left Circumflex Prox Cx lesion is 80% stenosed.  Right Coronary Artery Prox RCA lesion is 100% stenosed.  Right Posterior Descending Artery Collaterals RPDA filled by collaterals from 3rd Sept.  Intervention  No interventions have been documented.   STRESS TESTS  MYOCARDIAL PERFUSION IMAGING 07/10/2018   ECHOCARDIOGRAM  ECHOCARDIOGRAM COMPLETE  09/11/2021  Narrative ECHOCARDIOGRAM REPORT    Patient Name:   Nicholas Okray. Date of Exam: 09/11/2021 Medical Rec #:  [id number]         Height:       70.0 in Accession #:    [id number]        Weight:       178.6 lb Date of Birth:  08-Feb-1951         BSA:          1.989 m Patient Age:    71 years          BP:           118/62 mmHg Patient Gender: M                 HR:           83 bpm. Exam Location:  [facility]  Procedure: 2D Echo, Cardiac Doppler, Color Doppler, Strain Analysis and Intracardiac Opacification Agent  Indications:    Cardiomyopathy-Ischemic I25.5  History:        Patient has prior history of Echocardiogram examinations, most recent 01/09/2021. Arrythmias:Atrial Fibrillation and RBBB; Risk Factors:Dyslipidemia and Hypertension.  Sonographer:    Louie Boston RDCS Referring Phys: [phone] [facility]   Sonographer Comments: Suboptimal apical window. IMPRESSIONS   1. Left ventricular ejection fraction, by estimation, is 40 to 45%. The left ventricle has mildly decreased function. The left ventricle demonstrates global hypokinesis. There is mild concentric left ventricular hypertrophy. Left ventricular diastolic parameters are consistent with Grade III diastolic dysfunction (restrictive). Elevated left atrial pressure. 2. Right ventricular systolic function is mildly reduced. The right ventricular size is normal. There is moderately elevated pulmonary artery systolic pressure. 3. Right atrial size was moderately dilated. 4. The mitral valve is normal in structure. Mild mitral valve regurgitation. No evidence of mitral stenosis. 5. The aortic valve is tricuspid. Aortic valve regurgitation is not visualized. Aortic valve sclerosis is present, with no evidence of aortic valve stenosis. 6. Aortic Normal DTA. 7. The inferior vena cava is normal in size with greater than 50% respiratory variability, suggesting right atrial pressure of 3 mmHg.  FINDINGS Left Ventricle: Left  ventricular ejection fraction, by estimation, is 40 to 45%. The left ventricle has mildly decreased function. The left ventricle demonstrates global hypokinesis. Definity contrast agent was given IV to delineate the left ventricular endocardial borders. The left ventricular internal cavity size was normal in size. There is mild concentric left ventricular hypertrophy. Left ventricular diastolic parameters are consistent with Grade III diastolic dysfunction (restrictive). Elevated left atrial pressure.  Right Ventricle: The right ventricular size is normal. No increase in right ventricular wall thickness. Right ventricular systolic function is mildly reduced. There is moderately elevated pulmonary artery systolic pressure. The tricuspid regurgitant velocity is 3.58 m/s, and with an assumed right atrial pressure of 3 mmHg, the estimated right ventricular systolic pressure is 54.3 mmHg.  Left Atrium: Left atrial size was normal in size.  Right Atrium: Right atrial size was moderately dilated.  Pericardium: There is no evidence of pericardial effusion.  Mitral Valve: The mitral valve is normal in structure. Mild mitral valve regurgitation. No evidence of mitral valve stenosis.  Tricuspid Valve: The tricuspid valve is normal in structure. Tricuspid valve regurgitation is mild . No evidence of tricuspid stenosis.  Aortic Valve: The aortic valve is tricuspid. Aortic valve regurgitation is not visualized. Aortic valve sclerosis is present, with no evidence of aortic valve stenosis.  Pulmonic Valve: The pulmonic valve was normal in structure. Pulmonic valve regurgitation is not visualized. No evidence of pulmonic stenosis.  Aorta: The aortic root and ascending aorta are structurally normal, with no evidence of dilitation, the aortic arch was not well visualized and Normal DTA.  Venous: The pulmonary veins were not well visualized. The inferior vena cava is normal in size with greater than 50%  respiratory variability, suggesting right atrial pressure of 3 mmHg.  IAS/Shunts: No atrial level shunt detected by color flow Doppler.   LEFT VENTRICLE PLAX 2D LVIDd:         4.80 cm     Diastology LV PW:         1.20 cm     LV e' medial:    3.65 cm/s LV IVS:        1.20 cm     LV E/e' medial:  28.5 LVOT diam:     2.00 cm     LV e' lateral:   6.89 cm/s LV SV:  48          LV E/e' lateral: 15.1 LV SV Index:   24 LVOT Area:     3.14 cm  LV Volumes (MOD) LV vol d, MOD A2C: 58.5 ml LV vol d, MOD A4C: 72.9 ml LV vol s, MOD A2C: 33.8 ml LV vol s, MOD A4C: 32.7 ml LV SV MOD A2C:     24.7 ml LV SV MOD A4C:     72.9 ml LV SV MOD BP:      31.6 ml  RIGHT VENTRICLE RV S prime:     6.41 cm/s TAPSE (M-mode): 1.6 cm  LEFT ATRIUM             Index        RIGHT ATRIUM           Index LA diam:        3.20 cm 1.61 cm/m   RA Area:     19.50 cm LA Vol (A2C):   73.9 ml 37.15 ml/m  RA Volume:   51.40 ml  25.84 ml/m LA Vol (A4C):   80.2 ml 40.32 ml/m LA Biplane Vol: 84.1 ml 42.28 ml/m AORTIC VALVE LVOT Vmax:   73.20 cm/s LVOT Vmean:  51.900 cm/s LVOT VTI:    0.153 m  AORTA Ao Root diam: 3.70 cm Ao Asc diam:  3.50 cm Ao Desc diam: 1.90 cm  MITRAL VALVE                  TRICUSPID VALVE MV Area (PHT): 5.54 cm       TR Peak grad:   51.3 mmHg MV Decel Time: 137 msec       TR Vmax:        358.00 cm/s MR Peak grad:    86.5 mmHg MR Mean grad:    61.0 mmHg    SHUNTS MR Vmax:         465.00 cm/s  Systemic VTI:  0.15 m MR Vmean:        368.0 cm/s   Systemic Diam: 2.00 cm MR PISA:         2.26 cm MR PISA Eff ROA: 19 mm MR PISA Radius:  0.60 cm MV E velocity: 104.00 cm/s MV A velocity: 32.50 cm/s MV E/A ratio:  3.20  Norman Herrlich MD Electronically signed by Norman Herrlich MD Signature Date/Time: 09/11/2021/4:51:54 PM    Final   TEE  ECHO INTRAOPERATIVE TEE 07/18/2018  Interpretation Summary   Left ventricle: Normal cavity size and wall thickness. Wall motion is  abnormal.   Left atrium: Cavity is dilated and dilated.   Aortic valve: No stenosis.   Mitral valve:  Mild to moderate regurgitation.   Right ventricle:  Normal cavity size, wall thickness and ejection fraction.   Right atrium:  Cavity is dilated.   Tricuspid valve: Mild regurgitation.   MONITORS  LONG TERM MONITOR (3-14 DAYS) 08/29/2021  Narrative Patch Wear Time:  6 days and 9 hours (2023-06-27T08:34:16-0400 to 2023-07-03T18:13:14-0400)  Patient had a min HR of 57 bpm, max HR of 154 bpm, and avg HR of 77 bpm. Predominant underlying rhythm was Sinus Rhythm. First Degree AV Block was present. Bundle Branch Block/IVCD was present.  8 Supraventricular Tachycardia runs occurred, the run with the fastest interval lasting 5 beats with a max rate of 154 bpm, the longest lasting 8 beats with an avg rate of 101 bpm.  Isolated SVEs were occasional (1.6%, 11055), SVE Couplets were rare (<1.0%, 3033), and no  SVE Triplets were present. There were no episodes of atrial fibrillation or flutter.  There were no episodes of sinus pauses secondary third-degree AV block or sinus node exit block.  Isolated VEs were rare (<1.0%), VE Couplets were rare (<1.0%), and no VE Triplets were present.  There is 1 single triggered event sinus rhythm            EKG today confirmsSinus bradycardia with marked sinus arrhythmia Right bundle branch block Left posterior fascicular block sinus arrhythmia seen in pattern of septal infarction Recent Labs: 08/27/2022: BUN 19; Creatinine, Ser 1.11; Hemoglobin 14.8; Platelets 176; Potassium 4.7; Sodium 134  Recent Lipid Panel    Component Value Date/Time   CHOL 99 (L) 09/24/2018 1426   TRIG 110 09/24/2018 1426   HDL 40 09/24/2018 1426   CHOLHDL 2.5 09/24/2018 1426   CHOLHDL 4.5 07/12/2018 0350   VLDL 74 (H) 07/12/2018 0350   LDLCALC 37 09/24/2018 1426    Physical Exam:    VS:  BP 112/60   Pulse (!) 57   Ht 5\' 10"  (1.778 m)   Wt 178 lb 3.2 oz (80.8 kg)   SpO2 99%    BMI 25.57 kg/m     Wt Readings from Last 3 Encounters:  09/14/22 178 lb 3.2 oz (80.8 kg)  08/27/22 176 lb 3.2 oz (79.9 kg)  08/03/22 175 lb 6.4 oz (79.6 kg)     GEN:  Well nourished, well developed in no acute distress HEENT: Normal NECK: No JVD; No carotid bruits LYMPHATICS: No lymphadenopathy CARDIAC: RRR, no murmurs, rubs, gallops RESPIRATORY:  Clear to auscultation without rales, wheezing or rhonchi  ABDOMEN: Soft, non-tender, non-distended MUSCULOSKELETAL:  No edema; No deformity  SKIN: Warm and dry NEUROLOGIC:  Alert and oriented x 3 PSYCHIATRIC:  Normal affect    Signed, Norman Herrlich, MD  09/14/2022 10:19 AM    [facility]

## 2022-09-14 ENCOUNTER — Ambulatory Visit: Payer: PPO | Attending: Cardiology | Admitting: Cardiology

## 2022-09-14 ENCOUNTER — Encounter: Payer: Self-pay | Admitting: Cardiology

## 2022-09-14 VITALS — BP 112/60 | HR 57 | Ht 70.0 in | Wt 178.2 lb

## 2022-09-14 DIAGNOSIS — I4892 Unspecified atrial flutter: Secondary | ICD-10-CM

## 2022-09-14 DIAGNOSIS — I110 Hypertensive heart disease with heart failure: Secondary | ICD-10-CM | POA: Diagnosis not present

## 2022-09-14 DIAGNOSIS — I4510 Unspecified right bundle-branch block: Secondary | ICD-10-CM

## 2022-09-14 DIAGNOSIS — Z7901 Long term (current) use of anticoagulants: Secondary | ICD-10-CM

## 2022-09-14 DIAGNOSIS — I2510 Atherosclerotic heart disease of native coronary artery without angina pectoris: Secondary | ICD-10-CM | POA: Diagnosis not present

## 2022-09-14 DIAGNOSIS — E785 Hyperlipidemia, unspecified: Secondary | ICD-10-CM | POA: Diagnosis not present

## 2022-09-14 DIAGNOSIS — I255 Ischemic cardiomyopathy: Secondary | ICD-10-CM

## 2022-09-14 NOTE — Patient Instructions (Signed)
Medication Instructions:  Your physician recommends that you continue on your current medications as directed. Please refer to the Current Medication list given to you today.  *If you need a refill on your cardiac medications before your next appointment, please call your pharmacy*   Lab Work: None If you have labs (blood work) drawn today and your tests are completely normal, you will receive your results only by: MyChart Message (if you have MyChart) OR A paper copy in the mail If you have any lab test that is abnormal or we need to change your treatment, we will call you to review the results.   Testing/Procedures: None   Follow-Up: At [facility], you and your health needs are our priority.  As part of our continuing mission to provide you with exceptional heart care, we have created designated Provider Care Teams.  These Care Teams include your primary Cardiologist (physician) and Advanced Practice Providers (APPs -  Physician Assistants and Nurse Practitioners) who all work together to provide you with the care you need, when you need it.  We recommend signing up for the patient portal called "MyChart".  Sign up information is provided on this After Visit Summary.  MyChart is used to connect with patients for Virtual Visits (Telemedicine).  Patients are able to view lab/test results, encounter notes, upcoming appointments, etc.  Non-urgent messages can be sent to your provider as well.   To learn more about what you can do with MyChart, go to [email or web address].    Your next appointment:   3 month(s)  Provider:   Norman Herrlich, MD    Other Instructions Get Mobile [place]

## 2022-09-19 ENCOUNTER — Encounter: Payer: Self-pay | Admitting: Cardiology

## 2022-12-03 ENCOUNTER — Ambulatory Visit: Payer: PPO | Attending: Cardiology | Admitting: Cardiology

## 2022-12-03 ENCOUNTER — Encounter: Payer: Self-pay | Admitting: Cardiology

## 2022-12-03 VITALS — BP 128/68 | HR 78 | Ht 70.0 in | Wt 173.6 lb

## 2022-12-03 DIAGNOSIS — I2510 Atherosclerotic heart disease of native coronary artery without angina pectoris: Secondary | ICD-10-CM

## 2022-12-03 DIAGNOSIS — I4819 Other persistent atrial fibrillation: Secondary | ICD-10-CM

## 2022-12-03 DIAGNOSIS — D6869 Other thrombophilia: Secondary | ICD-10-CM

## 2022-12-03 DIAGNOSIS — I10 Essential (primary) hypertension: Secondary | ICD-10-CM | POA: Diagnosis not present

## 2022-12-03 NOTE — Progress Notes (Signed)
Electrophysiology Office Note:   Date:  12/03/2022  ID:  Nicholas Mills., DOB 27-May-1950, MRN [id number]  Primary Cardiologist: Norman Herrlich, MD Electrophysiologist: Regan Lemming, MD      History of Present Illness:   Nicholas Mills. is a 72 y.o. male with h/o coronary artery disease post CABG in 2020, chronic systolic heart failure, atrial fibrillation, hypertension, hyperlipidemia, type 2 diabetes seen today for  for Electrophysiology evaluation of real fibrillation/flutter at the request of Norman Herrlich.    He initially had atrial fibrillation after his CABG.  He subsequently presented to cardiology clinic in atrial fibrillation and is now post cardioversion.  He feels improved in sinus rhythm.  Less fatigue and shortness of breath.  Since his cardioversion, he has continued to be quite active.  He had difficulty being active when he was in atrial fibrillation.  Review of systems complete and found to be negative unless listed in HPI.   EP Information / Studies Reviewed:    EKG is ordered today. Personal review as below.  EKG Interpretation Date/Time:  Monday December 03 2022 10:58:29 EDT Ventricular Rate:  78 PR Interval:  186 QRS Duration:  156 QT Interval:  420 QTC Calculation: 478 R Axis:   106  Text Interpretation: Normal sinus rhythm Possible Left atrial enlargement Right bundle branch block T wave abnormality, consider inferior ischemia When compared with ECG of 14-Sep-2022 09:50, No significant change since last tracing Confirmed by Marie Borowski ([id number]) on 12/03/2022 11:10:15 AM     Risk Assessment/Calculations:    CHA2DS2-VASc Score = 5   This indicates a 7.2% annual risk of stroke. The patient's score is based upon: CHF History: 1 HTN History: 1 Diabetes History: 1 Stroke History: 0 Vascular Disease History: 1 Age Score: 1 Gender Score: 0             Physical Exam:   VS:  BP 128/68   Pulse 78   Ht 5\' 10"  (1.778 m)   Wt 173 lb 9.6 oz (78.7  kg)   SpO2 94%   BMI 24.91 kg/m    Wt Readings from Last 3 Encounters:  12/03/22 173 lb 9.6 oz (78.7 kg)  09/14/22 178 lb 3.2 oz (80.8 kg)  08/27/22 176 lb 3.2 oz (79.9 kg)     GEN: Well nourished, well developed in no acute distress NECK: No JVD; No carotid bruits CARDIAC: Regular rate and rhythm, no murmurs, rubs, gallops RESPIRATORY:  Clear to auscultation without rales, wheezing or rhonchi  ABDOMEN: Soft, non-tender, non-distended EXTREMITIES:  No edema; No deformity   ASSESSMENT AND PLAN:    1.  Persistent atrial fibrillation/flutter: On metoprolol.  He feels quite poorly in atrial fibrillation.  He would like a rhythm control strategy.  He would prefer to avoid antiarrhythmics.  Due to that, we Mane Consolo plan for ablation.  Risk and benefits have been discussed.  He understands the risks and is agreed to the procedure.  Risk, benefits, and alternatives to EP study and radiofrequency/pulse field ablation for afib were also discussed in detail today. These risks include but are not limited to stroke, bleeding, vascular damage, tamponade, perforation, damage to the esophagus, lungs, and other structures, pulmonary vein stenosis, worsening renal function, and death. The patient understands these risk and wishes to proceed.  We Neytiri Asche therefore proceed with catheter ablation at the next available time.  Carto, ICE, anesthesia are requested for the procedure.  Alexyss Balzarini also obtain CT PV protocol prior to the procedure to exclude LAA thrombus  and further evaluate atrial anatomy.  2.  Coronary artery disease: Status post CABG.  No current chest pain.  Plan per primary cardiology.  3.  Hypertension: Well-controlled  4.  Secondary hypercoagulable state: Currently on Eliquis for atrial fibrillation  Follow up with Dr. Elberta Fortis as usual post procedure  Signed, Nicholas Mills Jorja Loa, MD

## 2022-12-03 NOTE — Patient Instructions (Signed)
Medication Instructions:  Your physician recommends that you continue on your current medications as directed. Please refer to the Current Medication list given to you today.  *If you need a refill on your cardiac medications before your next appointment, please call your pharmacy*   Lab Work: Pre procedure labs -- we will call you to schedule:  BMP & CBC  If you have a lab test that is abnormal and we need to change your treatment, we will call you to review the results -- otherwise no news is good news.    Testing/Procedures: Your physician has requested that you have cardiac CT 1 month PRIOR to your ablation. Cardiac computed tomography (CT) is a painless test that uses an x-ray machine to take clear, detailed pictures of your heart.  Please follow instruction below located under "other instructions". We will call you to schedule.  Your physician has recommended that you have an ablation. Catheter ablation is a medical procedure used to treat some cardiac arrhythmias (irregular heartbeats). During catheter ablation, a long, thin, flexible tube is put into a blood vessel in your groin (upper thigh), or neck. This tube is called an ablation catheter. It is then guided to your heart through the blood vessel. Radio frequency waves destroy small areas of heart tissue where abnormal heartbeats may cause an arrhythmia to start.   Your ablation is scheduled for 04/19/2023. Please arrive at [facility] at 8:30 am.  We will call you for further instructions.   Follow-Up: At [facility], you and your health needs are our priority.  As part of our continuing mission to provide you with exceptional heart care, we have created designated Provider Care Teams.  These Care Teams include your primary Cardiologist (physician) and Advanced Practice Providers (APPs -  Physician Assistants and Nurse Practitioners) who all work together to provide you with the care you need, when you need it.  Your next  appointment:   1 month(s) after your ablation  The format for your next appointment:   In Person  Provider:   [facility]   Thank you for choosing [facility]!!   Dory Horn, RN [phone]    Other Instructions   Cardiac Ablation Cardiac ablation is a procedure to destroy (ablate) some heart tissue that is sending bad signals. These bad signals cause problems in heart rhythm. The heart has many areas that make these signals. If there are problems in these areas, they can make the heart beat in a way that is not normal. Destroying some tissues can help make the heart rhythm normal. Tell your doctor about: Any allergies you have. All medicines you are taking. These include vitamins, herbs, eye drops, creams, and over-the-counter medicines. Any problems you or family members have had with medicines that make you fall asleep (anesthetics). Any blood disorders you have. Any surgeries you have had. Any medical conditions you have, such as kidney failure. Whether you are pregnant or may be pregnant. What are the risks? This is a safe procedure. But problems may occur, including: Infection. Bruising and bleeding. Bleeding into the chest. Stroke or blood clots. Damage to nearby areas of your body. Allergies to medicines or dyes. The need for a pacemaker if the normal system is damaged. Failure of the procedure to treat the problem. What happens before the procedure? Medicines Ask your doctor about: Changing or stopping your normal medicines. This is important. Taking aspirin and ibuprofen. Do not take these medicines unless your doctor tells you to  take them. Taking other medicines, vitamins, herbs, and supplements. General instructions Follow instructions from your doctor about what you cannot eat or drink. Plan to have someone take you home from the hospital or clinic. If you will be going home right after the procedure, plan to have someone with you for 24  hours. Ask your doctor what steps will be taken to prevent infection. What happens during the procedure?  An IV tube will be put into one of your veins. You will be given a medicine to help you relax. The skin on your neck or groin will be numbed. A cut (incision) will be made in your neck or groin. A needle will be put through your cut and into a large vein. A tube (catheter) will be put into the needle. The tube will be moved to your heart. Dye may be put through the tube. This helps your doctor see your heart. Small devices (electrodes) on the tube will send out signals. A type of energy will be used to destroy some heart tissue. The tube will be taken out. Pressure will be held on your cut. This helps stop bleeding. A bandage will be put over your cut. The exact procedure may vary among doctors and hospitals. What happens after the procedure? You will be watched until you leave the hospital or clinic. This includes checking your heart rate, breathing rate, oxygen, and blood pressure. Your cut will be watched for bleeding. You will need to lie still for a few hours. Do not drive for 24 hours or as long as your doctor tells you. Summary Cardiac ablation is a procedure to destroy some heart tissue. This is done to treat heart rhythm problems. Tell your doctor about any medical conditions you may have. Tell him or her about all medicines you are taking to treat them. This is a safe procedure. But problems may occur. These include infection, bruising, bleeding, and damage to nearby areas of your body. Follow what your doctor tells you about food and drink. You may also be told to change or stop some of your medicines. After the procedure, do not drive for 24 hours or as long as your doctor tells you. This information is not intended to replace advice given to you by your health care provider. Make sure you discuss any questions you have with your health care provider. Document Revised:  04/28/2021 Document Reviewed: 01/08/2019 Elsevier Patient Education  2023 Elsevier Inc.   Cardiac Ablation, Care After  This sheet gives you information about how to care for yourself after your procedure. Your health care provider may also give you more specific instructions. If you have problems or questions, contact your health care provider. What can I expect after the procedure? After the procedure, it is common to have: Bruising around your puncture site. Tenderness around your puncture site. Skipped heartbeats. If you had an atrial fibrillation ablation, you may have atrial fibrillation during the first several months after your procedure.  Tiredness (fatigue).  Follow these instructions at home: Puncture site care  Follow instructions from your health care provider about how to take care of your puncture site. Make sure you: If present, leave stitches (sutures), skin glue, or adhesive strips in place. These skin closures may need to stay in place for up to 2 weeks. If adhesive strip edges start to loosen and curl up, you may trim the loose edges. Do not remove adhesive strips completely unless your health care provider tells you to do that.  If a large square bandage is present, this may be removed 24 hours after surgery.  Check your puncture site every day for signs of infection. Check for: Redness, swelling, or pain. Fluid or blood. If your puncture site starts to bleed, lie down on your back, apply firm pressure to the area, and contact your health care provider. Warmth. Pus or a bad smell. A pea or small marble sized lump at the site is normal and can take up to three months to resolve.  Driving Do not drive for at least 4 days after your procedure or however long your health care provider recommends. (Do not resume driving if you have previously been instructed not to drive for other health reasons.) Do not drive or use heavy machinery while taking prescription pain  medicine. Activity Avoid activities that take a lot of effort for at least 7 days after your procedure. Do not lift anything that is heavier than 5 lb (4.5 kg) for one week.  No sexual activity for 1 week.  Return to your normal activities as told by your health care provider. Ask your health care provider what activities are safe for you. General instructions Take over-the-counter and prescription medicines only as told by your health care provider. Do not use any products that contain nicotine or tobacco, such as cigarettes and e-cigarettes. If you need help quitting, ask your health care provider. You may shower after 24 hours, but Do not take baths, swim, or use a hot tub for 1 week.  Do not drink alcohol for 24 hours after your procedure. Keep all follow-up visits as told by your health care provider. This is important. Contact a health care provider if: You have redness, mild swelling, or pain around your puncture site. You have fluid or blood coming from your puncture site that stops after applying firm pressure to the area. Your puncture site feels warm to the touch. You have pus or a bad smell coming from your puncture site. You have a fever. You have chest pain or discomfort that spreads to your neck, jaw, or arm. You have chest pain that is worse with lying on your back or taking a deep breath. You are sweating a lot. You feel nauseous. You have a fast or irregular heartbeat. You have shortness of breath. You are dizzy or light-headed and feel the need to lie down. You have pain or numbness in the arm or leg closest to your puncture site. Get help right away if: Your puncture site suddenly swells. Your puncture site is bleeding and the bleeding does not stop after applying firm pressure to the area. These symptoms may represent a serious problem that is an emergency. Do not wait to see if the symptoms will go away. Get medical help right away. Call your local emergency  services (911 in the [place] Do not drive yourself to the hospital. Summary After the procedure, it is normal to have bruising and tenderness at the puncture site in your groin, neck, or forearm. Check your puncture site every day for signs of infection. Get help right away if your puncture site is bleeding and the bleeding does not stop after applying firm pressure to the area. This is a medical emergency. This information is not intended to replace advice given to you by your health care provider. Make sure you discuss any questions you have with your health care provider.

## 2022-12-16 NOTE — Progress Notes (Unsigned)
Cardiology Office Note:    Date:  12/17/2022   ID:  Nicholas Mills., DOB 15-Oct-1950, MRN [id number]  PCP:  Lise Auer, MD  Cardiologist:  Norman Herrlich, MD    Referring MD: Lise Auer, MD    ASSESSMENT:    1. PAF (paroxysmal atrial fibrillation) (HCC)   2. On amiodarone therapy   3. Chronic anticoagulation   4. Coronary artery disease involving native coronary artery of native heart without angina pectoris   5. Ischemic cardiomyopathy   6. Hypertensive heart disease without heart failure   7. Dyslipidemia   8. RBBB (right bundle branch block)    PLAN:    In order of problems listed above:  He is maintaining sinus rhythm after cardioversion July continue his beta-blocker anticoagulant incursion purchased Apple Watch to self monitor and is scheduled for EP catheter ablation February Stable CAD after CABG having no anginal discomfort he will restart nonstatin lipid-lowering therapy with Zetia 10 mg daily  Recheck echocardiogram if EF remains reduced we will transition valsartan to Entresto Stable EKG pattern right bundle branch block   Next appointment: 6 months   Medication Adjustments/Labs and Tests Ordered: Current medicines are reviewed at length with the patient today.  Concerns regarding medicines are outlined above.  Orders Placed This Encounter  Procedures   EKG 12-Lead   No orders of the defined types were placed in this encounter.    History of Present Illness:    Nicholas Mills. is a 72 y.o. male with a hx of CAD with CABG 07/18/2018 initially severe LV dysfunction EF 28% prior to bypass surgery subsequently improved 40 to 45% with guideline directed therapy and revascularization right bundle branch block and left anterior hemiblock brief postoperative atrial fibrillation treated with amiodarone hypertension hyperlipidemia type 2 diabetes and unfortunately recurrent atrial arrhythmia requiring cardioversion to sinus rhythm.    He was  last seen by me  09/14/2022 and referred to EP.  Scheduled for atrial fibrillation EP catheter ablation 04/19/2023.  Previously he had severe muscle symptoms we needed to stop his statin he agrees to trial of nonstatin therapy with Zetia Pending EP catheter ablation he is going to purchase an Apple Watch to self monitor Record cardiac perspective feels well is not having palpitations syncope edema shortness of breath or chest pain He will be seeing his PCP in the next few weeks and anticipates labs being performed most recent labs 08/27/2022 creatinine 1.1 potassium 4.4  Compliance with diet, lifestyle and medications: Yes Past Medical History:  Diagnosis Date   Abnormal myocardial perfusion study 07/11/2018   Acute systolic CHF (congestive heart failure) (HCC)    CAD (coronary artery disease) 08/04/2018   Cardiomyopathy (HCC) 07/11/2018   CKD (chronic kidney disease) 08/04/2018   Coronary artery disease    a. 07/15/18 cardiac cath multivessel CAD, CT surgery consulted b.    Diabetes mellitus without complication (HCC)    Dyslipidemia associated with type 2 diabetes mellitus (HCC) 07/11/2018   Hiatal hernia    Hx of CABG    a. 07/18/18 LIMA to LAD - SVG to diagonal 2 - SVG to Ramus and OM - SVG to PDA   Hyperlipidemia    Hypertension    NSTEMI (non-ST elevated myocardial infarction) (HCC) 07/11/2018   On amiodarone therapy 08/04/2018   PAF (paroxysmal atrial fibrillation) (HCC)    a. briefly post op CABG 07/18/18 treated with amiodarone   RBBB (right bundle branch block) 07/11/2018   Right bundle branch block  S/P CABG x 5 07/20/2018   Statin myopathy 09/10/2022   Syncope    Syncope and collapse 07/11/2018    Current Medications: Current Meds  Medication Sig   Coenzyme Q10 (COQ10) 100 MG CAPS Take 100 mg by mouth daily.    ELIQUIS 5 MG TABS tablet Take 5 mg by mouth 2 (two) times daily.   gabapentin (NEURONTIN) 100 MG capsule Take 100-200 mg by mouth at bedtime as needed (pain).   JARDIANCE  25 MG TABS tablet Take 25 mg by mouth daily.   levocetirizine (XYZAL) 5 MG tablet Take 5 mg by mouth daily as needed for allergies.   metFORMIN (GLUCOPHAGE) 500 MG tablet Take 1,000 mg by mouth 2 (two) times daily with a meal.   metoprolol tartrate (LOPRESSOR) 50 MG tablet Take 50 mg by mouth 2 (two) times daily.   tadalafil (CIALIS) 20 MG tablet Take 20 mg by mouth daily as needed for erectile dysfunction.   traMADol (ULTRAM) 50 MG tablet Take 50 mg by mouth daily as needed for moderate pain.   valsartan (DIOVAN) 40 MG tablet Take 1 tablet (40 mg total) by mouth 2 (two) times daily.      EKGs/Labs/Other Studies Reviewed:    The following studies were reviewed today:    Recent Labs: 08/27/2022: BUN 19; Creatinine, Ser 1.11; Hemoglobin 14.8; Platelets 176; Potassium 4.7; Sodium 134  Recent Lipid Panel    Component Value Date/Time   CHOL 99 (L) 09/24/2018 1426   TRIG 110 09/24/2018 1426   HDL 40 09/24/2018 1426   CHOLHDL 2.5 09/24/2018 1426   CHOLHDL 4.5 07/12/2018 0350   VLDL 74 (H) 07/12/2018 0350   LDLCALC 37 09/24/2018 1426   EKG Interpretation Date/Time:  Monday December 17 2022 13:57:38 EDT Ventricular Rate:  79 PR Interval:  200 QRS Duration:  156 QT Interval:  418 QTC Calculation: 479 R Axis:   102  Text Interpretation: Normal sinus rhythm Possible Left atrial enlargement Right bundle branch block T wave abnormality, consider inferior ischemia Abnormal ECG When compared with ECG of 03-Dec-2022 10:58, No significant change was found Confirmed by Norman Herrlich ([id number]) on 12/17/2022 2:19:47 PM   Physical Exam:    VS:  BP 114/72 (BP Location: Left Arm, Patient Position: Sitting)   Pulse 79   Ht 5\' 10"  (1.778 m)   Wt 177 lb 9.6 oz (80.6 kg)   SpO2 93%   BMI 25.48 kg/m     Wt Readings from Last 3 Encounters:  12/17/22 177 lb 9.6 oz (80.6 kg)  12/03/22 173 lb 9.6 oz (78.7 kg)  09/14/22 178 lb 3.2 oz (80.8 kg)     GEN:  Well nourished, well developed in no acute  distress HEENT: Normal NECK: No JVD; No carotid bruits LYMPHATICS: No lymphadenopathy CARDIAC: RRR, no murmurs, rubs, gallops RESPIRATORY:  Clear to auscultation without rales, wheezing or rhonchi  ABDOMEN: Soft, non-tender, non-distended MUSCULOSKELETAL:  No edema; No deformity  SKIN: Warm and dry NEUROLOGIC:  Alert and oriented x 3 PSYCHIATRIC:  Normal affect    Signed, Norman Herrlich, MD  12/17/2022 2:19 PM    [facility]

## 2022-12-17 ENCOUNTER — Ambulatory Visit: Payer: PPO | Attending: Cardiology | Admitting: Cardiology

## 2022-12-17 ENCOUNTER — Encounter: Payer: Self-pay | Admitting: Cardiology

## 2022-12-17 VITALS — BP 114/72 | HR 79 | Ht 70.0 in | Wt 177.6 lb

## 2022-12-17 DIAGNOSIS — E785 Hyperlipidemia, unspecified: Secondary | ICD-10-CM | POA: Diagnosis not present

## 2022-12-17 DIAGNOSIS — Z7901 Long term (current) use of anticoagulants: Secondary | ICD-10-CM

## 2022-12-17 DIAGNOSIS — I4510 Unspecified right bundle-branch block: Secondary | ICD-10-CM

## 2022-12-17 DIAGNOSIS — Z79899 Other long term (current) drug therapy: Secondary | ICD-10-CM

## 2022-12-17 DIAGNOSIS — I119 Hypertensive heart disease without heart failure: Secondary | ICD-10-CM | POA: Diagnosis not present

## 2022-12-17 DIAGNOSIS — I2510 Atherosclerotic heart disease of native coronary artery without angina pectoris: Secondary | ICD-10-CM

## 2022-12-17 DIAGNOSIS — I255 Ischemic cardiomyopathy: Secondary | ICD-10-CM | POA: Diagnosis not present

## 2022-12-17 DIAGNOSIS — I480 Paroxysmal atrial fibrillation: Secondary | ICD-10-CM

## 2022-12-17 MED ORDER — EZETIMIBE 10 MG PO TABS: 10.0000 mg | ORAL_TABLET | Freq: Every day | ORAL | 3 refills | Status: DC

## 2022-12-17 NOTE — Patient Instructions (Signed)
Medication Instructions:  Your physician has recommended you make the following change in your medication:   START: Zetia 10 mg daily  *If you need a refill on your cardiac medications before your next appointment, please call your pharmacy*   Lab Work: None If you have labs (blood work) drawn today and your tests are completely normal, you will receive your results only by: MyChart Message (if you have MyChart) OR A paper copy in the mail If you have any lab test that is abnormal or we need to change your treatment, we will call you to review the results.   Testing/Procedures: Your physician has requested that you have an echocardiogram. Echocardiography is a painless test that uses sound waves to create images of your heart. It provides your doctor with information about the size and shape of your heart and how well your heart's chambers and valves are working. This procedure takes approximately one hour. There are no restrictions for this procedure. Please do NOT wear cologne, perfume, aftershave, or lotions (deodorant is allowed). Please arrive 15 minutes prior to your appointment time.    Follow-Up: At [facility], you and your health needs are our priority.  As part of our continuing mission to provide you with exceptional heart care, we have created designated Provider Care Teams.  These Care Teams include your primary Cardiologist (physician) and Advanced Practice Providers (APPs -  Physician Assistants and Nurse Practitioners) who all work together to provide you with the care you need, when you need it.  We recommend signing up for the patient portal called "MyChart".  Sign up information is provided on this After Visit Summary.  MyChart is used to connect with patients for Virtual Visits (Telemedicine).  Patients are able to view lab/test results, encounter notes, upcoming appointments, etc.  Non-urgent messages can be sent to your provider as well.   To learn more  about what you can do with MyChart, go to [email or web address].    Your next appointment:   6 month(s)  Provider:   Norman Herrlich, MD    Other Instructions Get an Apple watch

## 2023-01-08 ENCOUNTER — Ambulatory Visit: Payer: PPO | Attending: Cardiology

## 2023-01-08 DIAGNOSIS — I255 Ischemic cardiomyopathy: Secondary | ICD-10-CM

## 2023-01-08 DIAGNOSIS — I4510 Unspecified right bundle-branch block: Secondary | ICD-10-CM

## 2023-01-08 DIAGNOSIS — E785 Hyperlipidemia, unspecified: Secondary | ICD-10-CM | POA: Diagnosis not present

## 2023-01-08 DIAGNOSIS — I480 Paroxysmal atrial fibrillation: Secondary | ICD-10-CM | POA: Diagnosis not present

## 2023-01-08 DIAGNOSIS — I119 Hypertensive heart disease without heart failure: Secondary | ICD-10-CM | POA: Diagnosis not present

## 2023-01-08 DIAGNOSIS — Z79899 Other long term (current) drug therapy: Secondary | ICD-10-CM

## 2023-01-08 DIAGNOSIS — I2510 Atherosclerotic heart disease of native coronary artery without angina pectoris: Secondary | ICD-10-CM | POA: Diagnosis not present

## 2023-01-08 DIAGNOSIS — Z7901 Long term (current) use of anticoagulants: Secondary | ICD-10-CM | POA: Diagnosis not present

## 2023-01-08 LAB — ECHOCARDIOGRAM COMPLETE
Calc EF: 53.9 %
S' Lateral: 3.4 cm
Single Plane A2C EF: 49.7 %
Single Plane A4C EF: 57.4 %

## 2023-01-08 MED ORDER — PERFLUTREN LIPID MICROSPHERE
1.0000 mL | INTRAVENOUS | Status: AC | PRN
  Administered 2023-01-08: 7 mL via INTRAVENOUS

## 2023-01-09 ENCOUNTER — Telehealth: Payer: Self-pay

## 2023-01-09 ENCOUNTER — Telehealth: Payer: Self-pay | Admitting: Cardiology

## 2023-01-09 NOTE — Telephone Encounter (Signed)
-----   Message from [place] sent at 01/09/2023  7:08 AM EST ----- Good result heart muscle function remains normal he has an upcoming CT of his chest will be able to easily see if he has a hiatal hernia I am not concerned about this finding on the cardiac ultrasound.

## 2023-01-09 NOTE — Telephone Encounter (Signed)
Patient informed of results.  

## 2023-01-09 NOTE — Telephone Encounter (Signed)
Patient notified through my chart.

## 2023-01-09 NOTE — Telephone Encounter (Signed)
Pt would like a c/b to discuss test results. Please advise

## 2023-02-18 ENCOUNTER — Other Ambulatory Visit: Payer: Self-pay | Admitting: Cardiology

## 2023-03-14 DIAGNOSIS — M48061 Spinal stenosis, lumbar region without neurogenic claudication: Secondary | ICD-10-CM | POA: Diagnosis not present

## 2023-03-14 DIAGNOSIS — M5416 Radiculopathy, lumbar region: Secondary | ICD-10-CM | POA: Diagnosis not present

## 2023-03-14 DIAGNOSIS — M545 Low back pain, unspecified: Secondary | ICD-10-CM | POA: Diagnosis not present

## 2023-03-21 ENCOUNTER — Telehealth: Payer: Self-pay

## 2023-03-21 ENCOUNTER — Other Ambulatory Visit: Payer: Self-pay

## 2023-03-21 DIAGNOSIS — I480 Paroxysmal atrial fibrillation: Secondary | ICD-10-CM

## 2023-03-21 NOTE — Telephone Encounter (Signed)
Called pt to go over Ablation/CT Instructions. He will go to [facility] on 1/31 for labs and get his printed Instruction letters.   He will review them and call with any questions. I went over medications with him.  CT is scheduled on 2/4 at 10:00.

## 2023-03-22 DIAGNOSIS — I480 Paroxysmal atrial fibrillation: Secondary | ICD-10-CM | POA: Diagnosis not present

## 2023-03-23 LAB — CBC
Hematocrit: 45.2 % (ref 37.5–51.0)
Hemoglobin: 15.4 g/dL (ref 13.0–17.7)
MCH: 32.2 pg (ref 26.6–33.0)
MCHC: 34.1 g/dL (ref 31.5–35.7)
MCV: 95 fL (ref 79–97)
Platelets: 171 10*3/uL (ref 150–450)
RBC: 4.78 x10E6/uL (ref 4.14–5.80)
RDW: 13 % (ref 11.6–15.4)
WBC: 6.7 10*3/uL (ref 3.4–10.8)

## 2023-03-23 LAB — BASIC METABOLIC PANEL
BUN/Creatinine Ratio: 10 (ref 10–24)
BUN: 14 mg/dL (ref 8–27)
CO2: 23 mmol/L (ref 20–29)
Calcium: 9.9 mg/dL (ref 8.6–10.2)
Chloride: 97 mmol/L (ref 96–106)
Creatinine, Ser: 1.43 mg/dL — ABNORMAL HIGH (ref 0.76–1.27)
Glucose: 227 mg/dL — ABNORMAL HIGH (ref 70–99)
Potassium: 5 mmol/L (ref 3.5–5.2)
Sodium: 136 mmol/L (ref 134–144)
eGFR: 52 mL/min/{1.73_m2} — ABNORMAL LOW (ref >59)

## 2023-03-26 ENCOUNTER — Ambulatory Visit (HOSPITAL_COMMUNITY)
Admission: RE | Admit: 2023-03-26 | Discharge: 2023-03-26 | Disposition: A | Discharge status: Home / Self Care | Payer: PPO | Source: Ambulatory Visit | Attending: Cardiology | Admitting: Cardiology

## 2023-03-26 DIAGNOSIS — I4819 Other persistent atrial fibrillation: Secondary | ICD-10-CM | POA: Diagnosis not present

## 2023-03-26 MED ORDER — IOHEXOL 350 MG/ML SOLN
95.0000 mL | Freq: Once | INTRAVENOUS | Status: AC | PRN
  Administered 2023-03-26: 95 mL via INTRAVENOUS

## 2023-04-01 DIAGNOSIS — M5416 Radiculopathy, lumbar region: Secondary | ICD-10-CM | POA: Diagnosis not present

## 2023-04-04 DIAGNOSIS — K649 Unspecified hemorrhoids: Secondary | ICD-10-CM | POA: Diagnosis not present

## 2023-04-04 DIAGNOSIS — K5790 Diverticulosis of intestine, part unspecified, without perforation or abscess without bleeding: Secondary | ICD-10-CM | POA: Diagnosis not present

## 2023-04-18 ENCOUNTER — Encounter: Payer: Self-pay | Admitting: Emergency Medicine

## 2023-04-18 NOTE — Pre-Procedure Instructions (Signed)
 Instructed patient on the following items: Arrival time 0800 Nothing to eat or drink after midnight No meds AM of procedure Responsible person to drive you home and stay with you for 24 hrs  Have you missed any doses of anti-coagulant- Eliquis- takes twice a day, hasn't missed any doses.  Don't take dose in the morning.

## 2023-04-18 NOTE — Anesthesia Preprocedure Evaluation (Signed)
 Anesthesia Evaluation  Patient identified by MRN, date of birth, ID band Patient awake    Reviewed: Allergy & Precautions, NPO status , Patient's Chart, lab work & pertinent test results  History of Anesthesia Complications Negative for: history of anesthetic complications  Airway Mallampati: II  TM Distance: >3 FB Neck ROM: Full    Dental no notable dental hx. (+) Dental Advisory Given   Pulmonary COPD, former smoker   Pulmonary exam normal breath sounds clear to auscultation       Cardiovascular hypertension, Pt. on medications + CAD, + Past MI, + CABG and +CHF  Normal cardiovascular exam+ dysrhythmias Atrial Fibrillation  Rhythm:Regular Rate:Normal   Echo 12/2022  1. Left ventricular ejection fraction, by estimation, is 50 to 55%. The left ventricle has low normal function. The left ventricle demonstrates global hypokinesis. Left ventricular diastolic parameters are indeterminate.   2. Right ventricular systolic function is normal. The right ventricular size is normal. There is normal pulmonary artery systolic pressure. The estimated right ventricular systolic pressure is 28.6 mmHg.   3. LA appears to be partially compressed from possibly extracardiac structure (?hiatal hernia). Correlate with other imaging.   4. The mitral valve is grossly normal. Trivial mitral valve regurgitation. No evidence of mitral stenosis.   5. The aortic valve is tricuspid. There is mild thickening of the aortic valve. Aortic valve regurgitation is not visualized. No aortic stenosis is present.   6. The inferior vena cava is normal in size with greater than 50% respiratory variability, suggesting right atrial pressure of 3 mmHg.   Comparison(s): A prior study was performed on 09/11/2021. LVEF was 40-45%,  RV function was mildly reduced, Mild MR was reported. Extracardiac compression of left atrium was not prominent.   '20 Carotid US - 40-59% left ICAS,  1-39% right ICAS  '20 TTE - Akinesis of the left ventricular, mid-apical anterior wall. The anterior septum has preserved contractility. Mild hypokinesis of the left ventricular, entire inferolateral wall. EF 40-45%. Left ventricular diastolic Doppler parameters are consistent with pseudonormalization. RV cavity was mildly enlarged. LA was mildly dilated. RA was mildly dilated. Moderate MR. The MR jet is centrally-directed. TR is mild-moderate. Mild thickening and calcification of the aortic valve.  '20 Cath -  Ost LM lesion is 80% stenosed.  Prox Cx lesion is 80% stenosed.  Prox RCA lesion is 100% stenosed. Left to right collaterals.  Prox LAD to Mid LAD lesion is 25% stenosed.  2nd Diag lesion is 90% stenosed.  LV end diastolic pressure is normal.  There is no aortic valve stenosis.  RBBB    Neuro/Psych  Neuromuscular disease  negative psych ROS   GI/Hepatic Neg liver ROS, hiatal hernia,neg GERD  ,,  Endo/Other  diabetes, Type 2, Oral Hypoglycemic Agents    Renal/GU Renal disease     Musculoskeletal negative musculoskeletal ROS (+)    Abdominal   Peds  Hematology negative hematology ROS (+)   Anesthesia Other Findings   Reproductive/Obstetrics                             Anesthesia Physical Anesthesia Plan  ASA: 3  Anesthesia Plan: General   Post-op Pain Management: Tylenol PO (pre-op)*   Induction: Intravenous  PONV Risk Score and Plan: 2 and Treatment may vary due to age or medical condition, Ondansetron and Dexamethasone  Airway Management Planned: Oral ETT  Additional Equipment:   Intra-op Plan:   Post-operative Plan: Extubation in OR  Informed Consent: I have reviewed the patients History and Physical, chart, labs and discussed the procedure including the risks, benefits and alternatives for the proposed anesthesia with the patient or authorized representative who has indicated his/her understanding and acceptance.      Dental advisory given  Plan Discussed with: CRNA  Anesthesia Plan Comments:         Anesthesia Quick Evaluation

## 2023-04-19 ENCOUNTER — Ambulatory Visit (HOSPITAL_COMMUNITY)
Admission: RE | Admit: 2023-04-19 | Discharge: 2023-04-19 | Disposition: A | Discharge status: Home / Self Care | Payer: PPO | Attending: Cardiology | Admitting: Cardiology

## 2023-04-19 ENCOUNTER — Encounter (HOSPITAL_COMMUNITY): Payer: Self-pay | Admitting: Cardiology

## 2023-04-19 ENCOUNTER — Ambulatory Visit (HOSPITAL_COMMUNITY): Payer: Self-pay | Admitting: Anesthesiology

## 2023-04-19 ENCOUNTER — Other Ambulatory Visit: Payer: Self-pay

## 2023-04-19 ENCOUNTER — Ambulatory Visit (HOSPITAL_BASED_OUTPATIENT_CLINIC_OR_DEPARTMENT_OTHER): Payer: Self-pay | Admitting: Anesthesiology

## 2023-04-19 ENCOUNTER — Encounter (HOSPITAL_COMMUNITY)
Admission: RE | Disposition: A | Discharge status: Home / Self Care | Payer: Self-pay | Source: Home / Self Care | Attending: Cardiology

## 2023-04-19 DIAGNOSIS — Z951 Presence of aortocoronary bypass graft: Secondary | ICD-10-CM | POA: Insufficient documentation

## 2023-04-19 DIAGNOSIS — J449 Chronic obstructive pulmonary disease, unspecified: Secondary | ICD-10-CM | POA: Insufficient documentation

## 2023-04-19 DIAGNOSIS — I252 Old myocardial infarction: Secondary | ICD-10-CM | POA: Insufficient documentation

## 2023-04-19 DIAGNOSIS — I5022 Chronic systolic (congestive) heart failure: Secondary | ICD-10-CM | POA: Diagnosis not present

## 2023-04-19 DIAGNOSIS — Z7984 Long term (current) use of oral hypoglycemic drugs: Secondary | ICD-10-CM | POA: Diagnosis not present

## 2023-04-19 DIAGNOSIS — I4819 Other persistent atrial fibrillation: Secondary | ICD-10-CM | POA: Diagnosis not present

## 2023-04-19 DIAGNOSIS — I4892 Unspecified atrial flutter: Secondary | ICD-10-CM | POA: Diagnosis not present

## 2023-04-19 DIAGNOSIS — I110 Hypertensive heart disease with heart failure: Secondary | ICD-10-CM | POA: Diagnosis not present

## 2023-04-19 DIAGNOSIS — E119 Type 2 diabetes mellitus without complications: Secondary | ICD-10-CM | POA: Diagnosis not present

## 2023-04-19 DIAGNOSIS — I5021 Acute systolic (congestive) heart failure: Secondary | ICD-10-CM | POA: Diagnosis not present

## 2023-04-19 DIAGNOSIS — I4891 Unspecified atrial fibrillation: Secondary | ICD-10-CM | POA: Diagnosis not present

## 2023-04-19 DIAGNOSIS — Z87891 Personal history of nicotine dependence: Secondary | ICD-10-CM | POA: Diagnosis not present

## 2023-04-19 DIAGNOSIS — I2510 Atherosclerotic heart disease of native coronary artery without angina pectoris: Secondary | ICD-10-CM | POA: Diagnosis not present

## 2023-04-19 DIAGNOSIS — I130 Hypertensive heart and chronic kidney disease with heart failure and stage 1 through stage 4 chronic kidney disease, or unspecified chronic kidney disease: Secondary | ICD-10-CM | POA: Diagnosis not present

## 2023-04-19 DIAGNOSIS — E1122 Type 2 diabetes mellitus with diabetic chronic kidney disease: Secondary | ICD-10-CM | POA: Diagnosis not present

## 2023-04-19 DIAGNOSIS — N189 Chronic kidney disease, unspecified: Secondary | ICD-10-CM | POA: Diagnosis not present

## 2023-04-19 HISTORY — PX: ATRIAL FIBRILLATION ABLATION: EP1191

## 2023-04-19 LAB — GLUCOSE, CAPILLARY
Glucose-Capillary: 119 mg/dL — ABNORMAL HIGH (ref 70–99)
Glucose-Capillary: 150 mg/dL — ABNORMAL HIGH (ref 70–99)

## 2023-04-19 LAB — POCT ACTIVATED CLOTTING TIME: Activated Clotting Time: 394 s

## 2023-04-19 SURGERY — ATRIAL FIBRILLATION ABLATION
Anesthesia: General

## 2023-04-19 MED ORDER — SODIUM CHLORIDE 0.9 % IV SOLN: INTRAVENOUS | Status: DC

## 2023-04-19 MED ORDER — FENTANYL CITRATE (PF) 250 MCG/5ML IJ SOLN
INTRAMUSCULAR | Status: DC | PRN
  Administered 2023-04-19 (×2): 50 ug via INTRAVENOUS

## 2023-04-19 MED ORDER — ONDANSETRON HCL 4 MG/2ML IJ SOLN: 4.0000 mg | Freq: Four times a day (QID) | INTRAMUSCULAR | Status: DC | PRN

## 2023-04-19 MED ORDER — HEPARIN (PORCINE) IN NACL 1000-0.9 UT/500ML-% IV SOLN
INTRAVENOUS | Status: DC | PRN
Start: 2023-04-19 — End: 2023-04-19
  Administered 2023-04-19 (×3): 500 mL

## 2023-04-19 MED ORDER — HEPARIN SODIUM (PORCINE) 1000 UNIT/ML IJ SOLN
INTRAMUSCULAR | Status: AC
Start: 2023-04-19 — End: ?
  Filled 2023-04-19: qty 20

## 2023-04-19 MED ORDER — SUGAMMADEX SODIUM 200 MG/2ML IV SOLN
INTRAVENOUS | Status: DC | PRN
  Administered 2023-04-19: 200 mg via INTRAVENOUS

## 2023-04-19 MED ORDER — PHENYLEPHRINE HCL-NACL 20-0.9 MG/250ML-% IV SOLN
INTRAVENOUS | Status: DC | PRN
  Administered 2023-04-19: 40 ug/min via INTRAVENOUS

## 2023-04-19 MED ORDER — PROTAMINE SULFATE 10 MG/ML IV SOLN
INTRAVENOUS | Status: DC | PRN
  Administered 2023-04-19: 10 mg via INTRAVENOUS
  Administered 2023-04-19: 30 mg via INTRAVENOUS

## 2023-04-19 MED ORDER — FENTANYL CITRATE (PF) 100 MCG/2ML IJ SOLN
INTRAMUSCULAR | Status: AC
  Filled 2023-04-19: qty 2

## 2023-04-19 MED ORDER — ACETAMINOPHEN 325 MG PO TABS: 650.0000 mg | ORAL_TABLET | ORAL | Status: DC | PRN

## 2023-04-19 MED ORDER — SUCCINYLCHOLINE CHLORIDE 200 MG/10ML IV SOSY
PREFILLED_SYRINGE | INTRAVENOUS | Status: DC | PRN
Start: 2023-04-19 — End: 2023-04-19

## 2023-04-19 MED ORDER — PROPOFOL 10 MG/ML IV BOLUS
INTRAVENOUS | Status: DC | PRN
  Administered 2023-04-19: 120 mg via INTRAVENOUS

## 2023-04-19 MED ORDER — HEPARIN SODIUM (PORCINE) 1000 UNIT/ML IJ SOLN
INTRAMUSCULAR | Status: DC | PRN
Start: 2023-04-19 — End: 2023-04-19
  Administered 2023-04-19: 14000 [IU] via INTRAVENOUS

## 2023-04-19 MED ORDER — ACETAMINOPHEN 500 MG PO TABS
1000.0000 mg | ORAL_TABLET | Freq: Once | ORAL | Status: AC
  Administered 2023-04-19: 1000 mg via ORAL
  Filled 2023-04-19: qty 2

## 2023-04-19 MED ORDER — LIDOCAINE 2% (20 MG/ML) 5 ML SYRINGE
INTRAMUSCULAR | Status: DC | PRN
  Administered 2023-04-19: 80 mg via INTRAVENOUS

## 2023-04-19 MED ORDER — ATROPINE SULFATE 1 MG/ML IV SOLN
INTRAVENOUS | Status: DC | PRN
  Administered 2023-04-19: 1 mg via INTRAVENOUS

## 2023-04-19 MED ORDER — ROCURONIUM BROMIDE 10 MG/ML (PF) SYRINGE
PREFILLED_SYRINGE | INTRAVENOUS | Status: DC | PRN
  Administered 2023-04-19: 60 mg via INTRAVENOUS
  Administered 2023-04-19 (×2): 10 mg via INTRAVENOUS

## 2023-04-19 MED ORDER — ONDANSETRON HCL 4 MG/2ML IJ SOLN
INTRAMUSCULAR | Status: DC | PRN
  Administered 2023-04-19: 4 mg via INTRAVENOUS

## 2023-04-19 MED ORDER — DEXAMETHASONE SODIUM PHOSPHATE 10 MG/ML IJ SOLN
INTRAMUSCULAR | Status: DC | PRN
  Administered 2023-04-19: 5 mg via INTRAVENOUS

## 2023-04-19 SURGICAL SUPPLY — 22 items
BLANKET WARM UNDERBOD FULL ACC (MISCELLANEOUS) ×1 IMPLANT
CABLE PFA RX CATH CONN (CABLE) IMPLANT
CATH 8FR REPROCESSED SOUNDSTAR (CATHETERS) ×1 IMPLANT
CATH 8FR SOUNDSTAR REPROCESSED (CATHETERS) IMPLANT
CATH EZ STEER NAV 8MM D-F CUR (ABLATOR) IMPLANT
CATH FARAWAVE ABLATION 31 (CATHETERS) IMPLANT
CATH OCTARAY 2.0 F 3-3-3-3-3 (CATHETERS) IMPLANT
CATH WEBSTER BI DIR CS D-F CRV (CATHETERS) IMPLANT
CLOSURE MYNX CONTROL 6F/7F (Vascular Products) IMPLANT
CLOSURE PERCLOSE PROSTYLE (VASCULAR PRODUCTS) IMPLANT
COVER SWIFTLINK CONNECTOR (BAG) ×1 IMPLANT
DILATOR VESSEL 38 20CM 16FR (INTRODUCER) IMPLANT
GUIDEWIRE INQWIRE 1.5J.035X260 (WIRE) IMPLANT
INQWIRE 1.5J .035X260CM (WIRE) ×1 IMPLANT
KIT VERSACROSS CNCT FARADRIVE (KITS) IMPLANT
PACK EP LF (CUSTOM PROCEDURE TRAY) ×1 IMPLANT
PAD DEFIB RADIO PHYSIO CONN (PAD) ×1 IMPLANT
PATCH CARTO3 (PAD) IMPLANT
SHEATH FARADRIVE STEERABLE (SHEATH) IMPLANT
SHEATH PINNACLE 8F 10CM (SHEATH) IMPLANT
SHEATH PINNACLE 9F 10CM (SHEATH) IMPLANT
SHEATH PROBE COVER 6X72 (BAG) IMPLANT

## 2023-04-19 NOTE — Transfer of Care (Signed)
 Immediate Anesthesia Transfer of Care Note  Patient: Nicholas Mills.  Procedure(s) Performed: ATRIAL FIBRILLATION ABLATION  Patient Location: PACU and Cath Lab  Anesthesia Type:General  Level of Consciousness: awake  Airway & Oxygen Therapy: Patient Spontanous Breathing and Patient connected to nasal cannula oxygen  Post-op Assessment: Report given to RN  Post vital signs: Reviewed and stable  Last Vitals:  Vitals Value Taken Time  BP 96/63 04/19/23 1117  Temp 36.9 C 04/19/23 1116  Pulse 63 04/19/23 1123  Resp 11 04/19/23 1123  SpO2 93 % 04/19/23 1123  Vitals shown include unfiled device data.  Last Pain:  Vitals:   04/19/23 1116  PainSc: 0-No pain         Complications: There were no known notable events for this encounter.

## 2023-04-19 NOTE — Anesthesia Postprocedure Evaluation (Signed)
 Anesthesia Post Note  Patient: Nicholas Mills.  Procedure(s) Performed: ATRIAL FIBRILLATION ABLATION     Patient location during evaluation: PACU Anesthesia Type: General Level of consciousness: sedated and patient cooperative Pain management: pain level controlled Vital Signs Assessment: post-procedure vital signs reviewed and stable Respiratory status: spontaneous breathing Cardiovascular status: stable Anesthetic complications: no   There were no known notable events for this encounter.  Last Vitals:  Vitals:   04/19/23 1300 04/19/23 1330  BP: 98/67 108/73  Pulse: 77 87  Resp: 14 14  Temp:    SpO2: 92% 91%    Last Pain:  Vitals:   04/19/23 1205  PainSc: 0-No pain                 Lewie Loron

## 2023-04-19 NOTE — H&P (Signed)
  Electrophysiology Office Note:   Date:  04/19/2023  ID:  Nicholas Solian., DOB 07-11-1950, MRN [id number]  Primary Cardiologist: Norman Herrlich, MD Electrophysiologist: Regan Lemming, MD      History of Present Illness:   Nicholas Mazon. is a 73 y.o. male with h/o coronary artery disease post CABG in 2020, chronic systolic heart failure, atrial fibrillation, hypertension, hyperlipidemia, type 2 diabetes seen today for  for Electrophysiology evaluation of real fibrillation/flutter at the request of Norman Herrlich.    Today, denies symptoms of palpitations, chest pain, shortness of breath, orthopnea, PND, lower extremity edema, claudication, dizziness, presyncope, syncope, bleeding, or neurologic sequela. The patient is tolerating medications without difficulties. Plan ablation today.   EP Information / Studies Reviewed:    EKG is ordered today. Personal review as below.        Risk Assessment/Calculations:    CHA2DS2-VASc Score = 5   This indicates a 7.2% annual risk of stroke. The patient's score is based upon: CHF History: 1 HTN History: 1 Diabetes History: 1 Stroke History: 0 Vascular Disease History: 1 Age Score: 1 Gender Score: 0            Physical Exam:   VS:  There were no vitals taken for this visit.   Wt Readings from Last 3 Encounters:  12/17/22 80.6 kg  12/03/22 78.7 kg  09/14/22 80.8 kg    GEN: No acute distress.   Neck: No JVD Cardiac: RRR, no murmurs, rubs, or gallops.  Respiratory: decreased BS bases bilaterally. GI: Soft, nontender, non-distended  MS: No edema; No deformity. Neuro:  Nonfocal  Skin: warm and dry Psych: Normal affect    ASSESSMENT AND PLAN:    1.  Persistent atrial fibrillation/flutter: Nicholas Solian. has presented today for surgery, with the diagnosis of AF.  The various methods of treatment have been discussed with the patient and family. After consideration of risks, benefits and other options for treatment, the patient  has consented to  Procedure(s): Catheter ablation as a surgical intervention .  Risks include but not limited to complete heart block, stroke, esophageal damage, nerve damage, bleeding, vascular damage, tamponade, perforation, MI, and death. The patient's history has been reviewed, patient examined, no change in status, stable for surgery.  I have reviewed the patient's chart and labs.  Questions were answered to the patient's satisfaction.    Kamika Goodloe Elberta Fortis, MD 04/19/2023 8:48 AM

## 2023-04-19 NOTE — Discharge Instructions (Signed)

## 2023-04-19 NOTE — Progress Notes (Signed)
 Pt ambulated to and from bathroom to void with no signs of oozing from bilateral groin sites

## 2023-04-19 NOTE — Anesthesia Procedure Notes (Signed)
 Procedure Name: Intubation Date/Time: 04/19/2023 9:34 AM  Performed by: Loleta Charlane Westry, CRNAPre-anesthesia Checklist: Patient identified, Patient being monitored, Timeout performed, Emergency Drugs available and Suction available Patient Re-evaluated:Patient Re-evaluated prior to induction Oxygen Delivery Method: Circle system utilized Preoxygenation: Pre-oxygenation with 100% oxygen Induction Type: IV induction Ventilation: Mask ventilation without difficulty Laryngoscope Size: Mac and 4 Grade View: Grade I Tube type: Oral Tube size: 7.0 mm Number of attempts: 1 Airway Equipment and Method: Stylet Placement Confirmation: ETT inserted through vocal cords under direct vision, positive ETCO2 and breath sounds checked- equal and bilateral Secured at: 22 cm Tube secured with: Tape Dental Injury: Teeth and Oropharynx as per pre-operative assessment

## 2023-04-22 ENCOUNTER — Telehealth (HOSPITAL_COMMUNITY): Payer: Self-pay

## 2023-04-22 MED FILL — Fentanyl Citrate Preservative Free (PF) Inj 100 MCG/2ML: INTRAMUSCULAR | Qty: 2 | Status: AC

## 2023-04-22 NOTE — Telephone Encounter (Signed)
 Spoke with patient to complete post procedure follow up call.   Patient removed large bandage at puncture sites after 24 hours. He reports, he forgot the weight restrictions and accidentally picked something up over 5 lbs, but quickly put it back down. Reports groin tenderness but denied any bleeding or other complications. Reviewed restrictions with patient and he voiced understanding.  Instructions reviewed with patient:  It is normal to have bruising, tenderness and a pea or marble sized lump/knot at the groin site which can take up to three months to resolve.  Get help right away if you notice sudden swelling at the puncture site.  Check your puncture site every day for signs of infection: fever, redness, swelling, pus drainage, warmth, foul odor or excessive pain. If this occurs, please call the office at [phone], to speak with the nurse. Get help right away if your puncture site is bleeding and the bleeding does not stop after applying firm pressure to the area.  You may continue to have skipped beats/ atrial fibrillation during the first several months after your procedure.  It is very important not to miss any doses of your blood thinner Eliquis. Patient restarted taking this medication on 04/19/23.   You will follow up with the Afib clinic on 05/17/23 with the APP or Dr.Will Camnitz on 07/22/23 months after your procedure. Patient states he will be out of town in [place] on 3/28 and requested to reschedule appointment for the following week. Afib clinic appointment moved to 05/24/23.   Patient verbalized understanding to all instructions provided, appointment change and appreciated the call.

## 2023-04-25 DIAGNOSIS — M5416 Radiculopathy, lumbar region: Secondary | ICD-10-CM | POA: Diagnosis not present

## 2023-05-17 ENCOUNTER — Ambulatory Visit (HOSPITAL_COMMUNITY): Payer: PPO | Admitting: Internal Medicine

## 2023-05-24 ENCOUNTER — Ambulatory Visit (HOSPITAL_COMMUNITY)
Admission: RE | Admit: 2023-05-24 | Discharge: 2023-05-24 | Disposition: A | Discharge status: Home / Self Care | Source: Ambulatory Visit | Attending: Internal Medicine | Admitting: Internal Medicine

## 2023-05-24 ENCOUNTER — Encounter (HOSPITAL_COMMUNITY): Payer: Self-pay | Admitting: Internal Medicine

## 2023-05-24 VITALS — BP 128/88 | HR 70 | Ht 70.0 in | Wt 168.4 lb

## 2023-05-24 DIAGNOSIS — Z951 Presence of aortocoronary bypass graft: Secondary | ICD-10-CM | POA: Insufficient documentation

## 2023-05-24 DIAGNOSIS — I110 Hypertensive heart disease with heart failure: Secondary | ICD-10-CM | POA: Insufficient documentation

## 2023-05-24 DIAGNOSIS — I252 Old myocardial infarction: Secondary | ICD-10-CM | POA: Insufficient documentation

## 2023-05-24 DIAGNOSIS — I480 Paroxysmal atrial fibrillation: Secondary | ICD-10-CM | POA: Diagnosis not present

## 2023-05-24 DIAGNOSIS — I4819 Other persistent atrial fibrillation: Secondary | ICD-10-CM

## 2023-05-24 DIAGNOSIS — I2510 Atherosclerotic heart disease of native coronary artery without angina pectoris: Secondary | ICD-10-CM | POA: Diagnosis not present

## 2023-05-24 DIAGNOSIS — Z7901 Long term (current) use of anticoagulants: Secondary | ICD-10-CM | POA: Diagnosis not present

## 2023-05-24 DIAGNOSIS — E785 Hyperlipidemia, unspecified: Secondary | ICD-10-CM | POA: Diagnosis not present

## 2023-05-24 DIAGNOSIS — D6869 Other thrombophilia: Secondary | ICD-10-CM

## 2023-05-24 DIAGNOSIS — I5021 Acute systolic (congestive) heart failure: Secondary | ICD-10-CM | POA: Diagnosis not present

## 2023-05-24 DIAGNOSIS — E119 Type 2 diabetes mellitus without complications: Secondary | ICD-10-CM | POA: Insufficient documentation

## 2023-05-24 HISTORY — DX: Other persistent atrial fibrillation: I48.19

## 2023-05-24 HISTORY — DX: Other thrombophilia: D68.69

## 2023-05-24 NOTE — Progress Notes (Addendum)
 Primary Care Physician: Lise Auer, MD Primary Cardiologist: Norman Herrlich, MD Electrophysiologist: Will Jorja Loa, MD     Referring Physician: Dr. Myrtie Cruise Holmes Hays. is a 73 y.o. male with a history of NSTEMI, CAD s/p CABG, HTN, HLD, T2DM, acute systolic CHF, and persistent atrial fibrillation who presents for consultation in the [facility]. Patient is on Eliquis 5 mg BID for a CHADS2VASC score of 5.  On evaluation today, patient is currently in NSR. S/p Afib ablation on 04/19/23 by Dr. Elberta Fortis. No episodes of Afib since ablation. He is doing well overall. No chest pain or SOB. Leg sites healed without issue. No missed doses of Eliquis 5 mg BID  Today, he denies symptoms of orthopnea, PND, lower extremity edema, dizziness, presyncope, syncope, snoring, daytime somnolence, bleeding, or neurologic sequela. The patient is tolerating medications without difficulties and is otherwise without complaint today.    he has a BMI of Body mass index is 24.16 kg/m.Marland Kitchen Filed Weights   05/24/23 0938  Weight: 76.4 kg    Current Outpatient Medications  Medication Sig Dispense Refill   Coenzyme Q10 (COQ10) 100 MG CAPS Take 100 mg by mouth daily.      ELIQUIS 5 MG TABS tablet Take 5 mg by mouth 2 (two) times daily.     ezetimibe (ZETIA) 10 MG tablet Take 1 tablet (10 mg total) by mouth daily. 90 tablet 3   JARDIANCE 25 MG TABS tablet Take 25 mg by mouth daily.     levocetirizine (XYZAL) 5 MG tablet Take 5 mg by mouth daily as needed for allergies.     Melatonin 10 MG CAPS Take 10 mg by mouth at bedtime as needed (sleep).     metFORMIN (GLUCOPHAGE-XR) 500 MG 24 hr tablet Take 1,000 mg by mouth 2 (two) times daily.     metoprolol tartrate (LOPRESSOR) 50 MG tablet Take 50 mg by mouth 2 (two) times daily.     Multiple Vitamin (MULTIVITAMIN WITH MINERALS) TABS tablet Take 2 tablets by mouth daily.     PSYLLIUM HUSK PO Take 3 capsules by mouth 2 (two) times  daily.     tadalafil (CIALIS) 20 MG tablet Take 20 mg by mouth daily as needed for erectile dysfunction.     traMADol (ULTRAM) 50 MG tablet Take 50 mg by mouth daily as needed for moderate pain.     valsartan (DIOVAN) 40 MG tablet Take 1 tablet (40 mg total) by mouth 2 (two) times daily. 180 tablet 2   zolpidem (AMBIEN) 10 MG tablet Take 10 mg by mouth at bedtime as needed for sleep.     No current facility-administered medications for this encounter.    Atrial Fibrillation Management history:  Previous antiarrhythmic drugs: none Previous cardioversions: none Previous ablations: 04/19/23 Anticoagulation history: Eliquis 5 mg BID   ROS- All systems are reviewed and negative except as per the HPI above.  Physical Exam: BP 128/88   Pulse 70   Ht 5\' 10"  (1.778 m)   Wt 76.4 kg   BMI 24.16 kg/m   GEN: Well nourished, well developed in no acute distress NECK: No JVD; No carotid bruits CARDIAC: Regular rate and rhythm, no murmurs, rubs, gallops RESPIRATORY:  Clear to auscultation without rales, wheezing or rhonchi  ABDOMEN: Soft, non-tender, non-distended EXTREMITIES:  No edema; No deformity   EKG today demonstrates  Vent. rate 70 BPM PR interval 198 ms QRS duration 158 ms QT/QTcB 448/483 ms P-R-T  axes 43 124 1 Normal sinus rhythm Right bundle branch block T wave abnormality, consider inferior ischemia Abnormal ECG When compared with ECG of 19-Apr-2023 11:16, PREVIOUS ECG IS PRESENT  Echo 01/08/23 demonstrated  1. Left ventricular ejection fraction, by estimation, is 50 to 55%. The  left ventricle has low normal function. The left ventricle demonstrates  global hypokinesis. Left ventricular diastolic parameters are  indeterminate.   2. Right ventricular systolic function is normal. The right ventricular  size is normal. There is normal pulmonary artery systolic pressure. The  estimated right ventricular systolic pressure is 28.6 mmHg.   3. LA appears to be partially  compressed from possibly extracardiac  structure (?hiatal hernia). Correlate with other imaging.   4. The mitral valve is grossly normal. Trivial mitral valve  regurgitation. No evidence of mitral stenosis.   5. The aortic valve is tricuspid. There is mild thickening of the aortic  valve. Aortic valve regurgitation is not visualized. No aortic stenosis is  present.   6. The inferior vena cava is normal in size with greater than 50%  respiratory variability, suggesting right atrial pressure of 3 mmHg.   Comparison(s): A prior study was performed on 09/11/2021. LVEF was 40-45%,  RV function was mildly reduced, Mild MR was reported. Extracardiac  compression of left atrium was not prominent.   ASSESSMENT & PLAN CHA2DS2-VASc Score = 5  The patient's score is based upon: CHF History: 1 HTN History: 1 Diabetes History: 1 Stroke History: 0 Vascular Disease History: 1 Age Score: 1 Gender Score: 0       ASSESSMENT AND PLAN: Persistent Atrial Fibrillation (ICD10:  I48.19) The patient's CHA2DS2-VASc score is 5, indicating a 7.2% annual risk of stroke.   S/p Afib ablation on 04/19/23 by Dr. Elberta Fortis.  He is currently in NSR. Continue Lopressor 50 mg BID.   Secondary Hypercoagulable State (ICD10:  D68.69) The patient is at significant risk for stroke/thromboembolism based upon his CHA2DS2-VASc Score of 5.  Continue Apixaban (Eliquis).  Continue Eliquis 5 mg BID without interruption.     Follow up with Dr. Elberta Fortis as scheduled.    Lake Bells, PA-C  Afib Clinic [facility] [address] [place], [place] [address] [phone]

## 2023-05-29 DIAGNOSIS — I480 Paroxysmal atrial fibrillation: Secondary | ICD-10-CM | POA: Diagnosis not present

## 2023-05-29 DIAGNOSIS — E782 Mixed hyperlipidemia: Secondary | ICD-10-CM | POA: Diagnosis not present

## 2023-05-29 DIAGNOSIS — E785 Hyperlipidemia, unspecified: Secondary | ICD-10-CM | POA: Diagnosis not present

## 2023-05-29 DIAGNOSIS — Z6824 Body mass index (BMI) 24.0-24.9, adult: Secondary | ICD-10-CM | POA: Diagnosis not present

## 2023-05-29 DIAGNOSIS — Z9181 History of falling: Secondary | ICD-10-CM | POA: Diagnosis not present

## 2023-05-29 DIAGNOSIS — N529 Male erectile dysfunction, unspecified: Secondary | ICD-10-CM | POA: Diagnosis not present

## 2023-05-29 DIAGNOSIS — Z0000 Encounter for general adult medical examination without abnormal findings: Secondary | ICD-10-CM | POA: Diagnosis not present

## 2023-05-29 DIAGNOSIS — E1169 Type 2 diabetes mellitus with other specified complication: Secondary | ICD-10-CM | POA: Diagnosis not present

## 2023-05-29 DIAGNOSIS — I2510 Atherosclerotic heart disease of native coronary artery without angina pectoris: Secondary | ICD-10-CM | POA: Diagnosis not present

## 2023-05-29 LAB — LAB REPORT - SCANNED
A1c: 7.1
Albumin, Urine POC: 19.4
Albumin/Creatinine Ratio, Urine, POC: 46
Creatinine, POC: 42.5 mg/dL
EGFR: 60

## 2023-07-21 NOTE — Progress Notes (Unsigned)
  Electrophysiology Office Note:   Date:  07/22/2023  ID:  Nicholas Buzzard., DOB 1951-02-14, MRN [id number]  Primary Cardiologist: Zoe Hinds, MD Primary Heart Failure: None Electrophysiologist: Wylene Weissman Cortland Ding, MD      History of Present Illness:   Nicholas Sarli. is a 73 y.o. male with h/o coronary artery disease post CABG, chronic systolic heart failure, atrial fibrillation post ablation 04/19/2023, hypertension, hyperlipidemia, type 2 diabetes seen today for routine electrophysiology followup.   Since last being seen in our clinic the patient reports doing.  He has no no further episodes of atrial fibrillation.  He has not had palpitations, fatigue, shortness of breath.  He has some mild bruising that he attributes to his Eliquis.  Aside from that, he is happy with his control.  he denies chest pain, palpitations, dyspnea, PND, orthopnea, nausea, vomiting, dizziness, syncope, edema, weight gain, or early satiety.   Review of systems complete and found to be negative unless listed in HPI.   EP Information / Studies Reviewed:    EKG is ordered today. Personal review as below.  EKG Interpretation Date/Time:  Monday July 22 2023 10:59:32 EDT Ventricular Rate:  65 PR Interval:  208 QRS Duration:  158 QT Interval:  452 QTC Calculation: 470 R Axis:   106  Text Interpretation: Sinus rhythm with Premature atrial complexes Right bundle branch block  T wave inversion Anterior leads When compared with ECG of 24-May-2023 09:40, Premature atrial complexes are now Present  Confirmed by Kelsie Kramp ([id number]) on 07/22/2023 11:06:56 AM   Risk Assessment/Calculations:    CHA2DS2-VASc Score = 5  3 This indicates a 7.2% annual risk of stroke. The patient's score is based upon: CHF History: 1 HTN History: 1 Diabetes History: 1 Stroke History: 0 Vascular Disease History: 1 Age Score: 1 Gender Score: 0            Physical Exam:   VS:  BP 100/60   Pulse 65   Ht 5\' 10"  (1.778 m)    Wt 170 lb 12.8 oz (77.5 kg)   SpO2 94%   BMI 24.51 kg/m    Wt Readings from Last 3 Encounters:  07/22/23 170 lb 12.8 oz (77.5 kg)  05/24/23 168 lb 6.4 oz (76.4 kg)  04/19/23 170 lb (77.1 kg)     GEN: Well nourished, well developed in no acute distress NECK: No JVD; No carotid bruits CARDIAC: Regular rate and rhythm, no murmurs, rubs, gallops RESPIRATORY:  Clear to auscultation without rales, wheezing or rhonchi  ABDOMEN: Soft, non-tender, non-distended EXTREMITIES:  No edema; No deformity   ASSESSMENT AND PLAN:    1.  Persistent atrial fibrillation/flutter: Post ablation 04/19/2023.  He has had no further episodes of atrial fibrillation.  He has more energy with less fatigue and shortness of breath.  He is happy with his control.  Nicholas Mills continue with current management.  2.  Secondary hypercoagulable state: On Eliquis  3.  Coronary disease: Post CABG.  No current chest pain.  4.  Hypertension: Controlled  Follow up with Dr. Lawana Pray in 12 months  Signed, Dejae Bernet Cortland Ding, MD

## 2023-07-22 ENCOUNTER — Encounter: Payer: Self-pay | Admitting: Cardiology

## 2023-07-22 ENCOUNTER — Ambulatory Visit: Payer: PPO | Attending: Cardiology | Admitting: Cardiology

## 2023-07-22 VITALS — BP 100/60 | HR 65 | Ht 70.0 in | Wt 170.8 lb

## 2023-07-22 DIAGNOSIS — I2510 Atherosclerotic heart disease of native coronary artery without angina pectoris: Secondary | ICD-10-CM

## 2023-07-22 DIAGNOSIS — I4819 Other persistent atrial fibrillation: Secondary | ICD-10-CM | POA: Diagnosis not present

## 2023-07-22 DIAGNOSIS — I10 Essential (primary) hypertension: Secondary | ICD-10-CM | POA: Diagnosis not present

## 2023-07-22 DIAGNOSIS — D6869 Other thrombophilia: Secondary | ICD-10-CM

## 2023-07-22 NOTE — Patient Instructions (Signed)
 Medication Instructions:  Your physician recommends that you continue on your current medications as directed. Please refer to the Current Medication list given to you today.  *If you need a refill on your cardiac medications before your next appointment, please call your pharmacy*  Lab Work: None ordered  If you have any lab test that is abnormal or we need to change your treatment, we will call you to review the results.  Testing/Procedures: None ordered  Follow-Up: At [facility], you and your health needs are our priority.  As part of our continuing mission to provide you with exceptional heart care, our providers are all part of one team.  This team includes your primary Cardiologist (physician) and Advanced Practice Providers or APPs (Physician Assistants and Nurse Practitioners) who all work together to provide you with the care you need, when you need it.  Your next appointment:   1 year(s)  Provider:   Agatha Horsfall, MD     Thank you for choosing [facility]!!   Reece Cane, RN [phone]

## 2023-07-26 ENCOUNTER — Other Ambulatory Visit (HOSPITAL_COMMUNITY): Payer: Self-pay

## 2023-07-29 ENCOUNTER — Other Ambulatory Visit (HOSPITAL_COMMUNITY): Payer: Self-pay

## 2023-07-29 ENCOUNTER — Telehealth: Payer: Self-pay | Admitting: Cardiology

## 2023-07-29 ENCOUNTER — Other Ambulatory Visit: Payer: Self-pay

## 2023-07-29 MED ORDER — VALSARTAN 40 MG PO TABS
40.0000 mg | ORAL_TABLET | Freq: Two times a day (BID) | ORAL | 2 refills | Status: AC
  Filled 2023-11-18: qty 180, 90d supply, fill #0

## 2023-07-29 MED ORDER — METOPROLOL TARTRATE 50 MG PO TABS
50.0000 mg | ORAL_TABLET | Freq: Two times a day (BID) | ORAL | 3 refills | Status: DC
  Filled 2023-07-29 – 2023-08-05 (×2): qty 180, 90d supply, fill #0

## 2023-07-29 MED ORDER — EZETIMIBE 10 MG PO TABS
10.0000 mg | ORAL_TABLET | Freq: Every day | ORAL | 1 refills | Status: DC
  Filled 2023-07-29: qty 90, 90d supply, fill #0

## 2023-07-29 MED ORDER — METFORMIN HCL ER 500 MG PO TB24
1000.0000 mg | ORAL_TABLET | Freq: Two times a day (BID) | ORAL | 4 refills | Status: AC
  Filled 2023-07-29 – 2023-09-05 (×3): qty 360, 90d supply, fill #0
  Filled 2024-01-15 (×2): qty 360, 90d supply, fill #1

## 2023-07-29 MED ORDER — JARDIANCE 25 MG PO TABS
25.0000 mg | ORAL_TABLET | Freq: Every morning | ORAL | 3 refills | Status: AC
  Filled 2023-07-29: qty 90, 90d supply, fill #0
  Filled 2023-11-18: qty 90, 90d supply, fill #1
  Filled 2024-01-25: qty 90, 90d supply, fill #2

## 2023-07-29 MED ORDER — VALSARTAN 40 MG PO TABS
40.0000 mg | ORAL_TABLET | Freq: Two times a day (BID) | ORAL | 1 refills | Status: DC
  Filled 2023-07-29: qty 180, 90d supply, fill #0

## 2023-07-29 MED ORDER — METFORMIN HCL ER 500 MG PO TB24
1000.0000 mg | ORAL_TABLET | Freq: Two times a day (BID) | ORAL | 4 refills | Status: DC
  Filled 2023-07-29: qty 360, 90d supply, fill #0

## 2023-07-29 MED ORDER — JARDIANCE 25 MG PO TABS
25.0000 mg | ORAL_TABLET | Freq: Every morning | ORAL | 4 refills | Status: DC
  Filled 2023-07-29: qty 90, 90d supply, fill #0

## 2023-07-29 MED ORDER — ELIQUIS 5 MG PO TABS
5.0000 mg | ORAL_TABLET | Freq: Two times a day (BID) | ORAL | 3 refills | Status: AC
  Filled 2023-07-29 – 2023-07-31 (×2): qty 180, 90d supply, fill #0
  Filled 2023-10-26: qty 180, 90d supply, fill #1
  Filled 2024-01-25: qty 180, 90d supply, fill #2

## 2023-07-29 MED ORDER — EZETIMIBE 10 MG PO TABS
10.0000 mg | ORAL_TABLET | Freq: Every day | ORAL | 2 refills | Status: AC
  Filled 2023-07-29 – 2023-11-18 (×2): qty 90, 90d supply, fill #0

## 2023-07-29 NOTE — Telephone Encounter (Signed)
*  STAT* If patient is at the pharmacy, call can be transferred to refill team.   1. Which medications need to be refilled? (please list name of each medication and dose if known)   ezetimibe  (ZETIA ) 10 MG tablet    valsartan  (DIOVAN ) 40 MG tablet    2. Would you like to learn more about the convenience, safety, & potential cost savings by using the [facility]? No  3. Are you open to using the [facility] (Type [facility]. Yes, is transitioning over   4. Which pharmacy/location (including street and city if local pharmacy) is medication to be sent to? [facility]    5. Do they need a 30 day or 90 day supply? 90 day  Wants mail service.

## 2023-07-29 NOTE — Telephone Encounter (Signed)
 Pt's medications were sent to pt's pharmacy as requested. Confirmation received.

## 2023-07-30 ENCOUNTER — Encounter: Payer: Self-pay | Admitting: Pharmacist

## 2023-07-30 ENCOUNTER — Other Ambulatory Visit: Payer: Self-pay

## 2023-07-30 ENCOUNTER — Other Ambulatory Visit (HOSPITAL_COMMUNITY): Payer: Self-pay

## 2023-07-31 ENCOUNTER — Other Ambulatory Visit (HOSPITAL_COMMUNITY): Payer: Self-pay

## 2023-07-31 ENCOUNTER — Other Ambulatory Visit: Payer: Self-pay

## 2023-08-05 ENCOUNTER — Other Ambulatory Visit (HOSPITAL_COMMUNITY): Payer: Self-pay

## 2023-08-26 ENCOUNTER — Other Ambulatory Visit (HOSPITAL_COMMUNITY): Payer: Self-pay

## 2023-09-02 DIAGNOSIS — M4727 Other spondylosis with radiculopathy, lumbosacral region: Secondary | ICD-10-CM | POA: Diagnosis not present

## 2023-09-02 DIAGNOSIS — M5442 Lumbago with sciatica, left side: Secondary | ICD-10-CM | POA: Diagnosis not present

## 2023-09-02 DIAGNOSIS — Z1330 Encounter for screening examination for mental health and behavioral disorders, unspecified: Secondary | ICD-10-CM | POA: Diagnosis not present

## 2023-09-02 DIAGNOSIS — M48062 Spinal stenosis, lumbar region with neurogenic claudication: Secondary | ICD-10-CM | POA: Diagnosis not present

## 2023-09-04 DIAGNOSIS — M48062 Spinal stenosis, lumbar region with neurogenic claudication: Secondary | ICD-10-CM | POA: Diagnosis not present

## 2023-09-05 ENCOUNTER — Other Ambulatory Visit (HOSPITAL_COMMUNITY): Payer: Self-pay

## 2023-09-05 ENCOUNTER — Other Ambulatory Visit: Payer: Self-pay

## 2023-09-23 ENCOUNTER — Other Ambulatory Visit: Payer: Self-pay

## 2023-09-23 ENCOUNTER — Other Ambulatory Visit (HOSPITAL_COMMUNITY): Payer: Self-pay

## 2023-09-23 ENCOUNTER — Other Ambulatory Visit (HOSPITAL_BASED_OUTPATIENT_CLINIC_OR_DEPARTMENT_OTHER): Payer: Self-pay

## 2023-09-23 MED ORDER — PREDNISONE 10 MG PO TABS
ORAL_TABLET | ORAL | 0 refills | Status: AC
  Filled 2023-09-23: qty 21, 6d supply, fill #0

## 2023-10-08 DIAGNOSIS — Z135 Encounter for screening for eye and ear disorders: Secondary | ICD-10-CM | POA: Diagnosis not present

## 2023-10-08 DIAGNOSIS — M4316 Spondylolisthesis, lumbar region: Secondary | ICD-10-CM | POA: Diagnosis not present

## 2023-10-08 DIAGNOSIS — M5116 Intervertebral disc disorders with radiculopathy, lumbar region: Secondary | ICD-10-CM | POA: Diagnosis not present

## 2023-10-08 DIAGNOSIS — M4726 Other spondylosis with radiculopathy, lumbar region: Secondary | ICD-10-CM | POA: Diagnosis not present

## 2023-10-08 DIAGNOSIS — M48061 Spinal stenosis, lumbar region without neurogenic claudication: Secondary | ICD-10-CM | POA: Diagnosis not present

## 2023-10-15 DIAGNOSIS — M5416 Radiculopathy, lumbar region: Secondary | ICD-10-CM | POA: Diagnosis not present

## 2023-10-15 DIAGNOSIS — M48062 Spinal stenosis, lumbar region with neurogenic claudication: Secondary | ICD-10-CM | POA: Diagnosis not present

## 2023-10-23 DIAGNOSIS — M4727 Other spondylosis with radiculopathy, lumbosacral region: Secondary | ICD-10-CM | POA: Diagnosis not present

## 2023-10-23 DIAGNOSIS — M4316 Spondylolisthesis, lumbar region: Secondary | ICD-10-CM | POA: Diagnosis not present

## 2023-10-23 DIAGNOSIS — M5116 Intervertebral disc disorders with radiculopathy, lumbar region: Secondary | ICD-10-CM | POA: Diagnosis not present

## 2023-10-23 DIAGNOSIS — M48062 Spinal stenosis, lumbar region with neurogenic claudication: Secondary | ICD-10-CM | POA: Diagnosis not present

## 2023-10-24 DIAGNOSIS — Z01818 Encounter for other preprocedural examination: Secondary | ICD-10-CM | POA: Diagnosis not present

## 2023-10-24 DIAGNOSIS — E119 Type 2 diabetes mellitus without complications: Secondary | ICD-10-CM | POA: Diagnosis not present

## 2023-10-24 DIAGNOSIS — I4891 Unspecified atrial fibrillation: Secondary | ICD-10-CM | POA: Diagnosis not present

## 2023-10-24 DIAGNOSIS — J8410 Pulmonary fibrosis, unspecified: Secondary | ICD-10-CM | POA: Diagnosis not present

## 2023-10-24 DIAGNOSIS — K449 Diaphragmatic hernia without obstruction or gangrene: Secondary | ICD-10-CM | POA: Diagnosis not present

## 2023-10-24 DIAGNOSIS — N182 Chronic kidney disease, stage 2 (mild): Secondary | ICD-10-CM | POA: Diagnosis not present

## 2023-10-24 DIAGNOSIS — I2510 Atherosclerotic heart disease of native coronary artery without angina pectoris: Secondary | ICD-10-CM | POA: Diagnosis not present

## 2023-10-24 DIAGNOSIS — M5440 Lumbago with sciatica, unspecified side: Secondary | ICD-10-CM | POA: Diagnosis not present

## 2023-10-24 DIAGNOSIS — I10 Essential (primary) hypertension: Secondary | ICD-10-CM | POA: Diagnosis not present

## 2023-10-24 DIAGNOSIS — G8929 Other chronic pain: Secondary | ICD-10-CM | POA: Diagnosis not present

## 2023-10-26 ENCOUNTER — Other Ambulatory Visit (HOSPITAL_COMMUNITY): Payer: Self-pay

## 2023-10-28 ENCOUNTER — Other Ambulatory Visit (HOSPITAL_COMMUNITY): Payer: Self-pay

## 2023-10-29 DIAGNOSIS — M5116 Intervertebral disc disorders with radiculopathy, lumbar region: Secondary | ICD-10-CM | POA: Diagnosis not present

## 2023-10-29 DIAGNOSIS — M4727 Other spondylosis with radiculopathy, lumbosacral region: Secondary | ICD-10-CM | POA: Diagnosis not present

## 2023-10-29 DIAGNOSIS — Z981 Arthrodesis status: Secondary | ICD-10-CM | POA: Diagnosis not present

## 2023-10-29 DIAGNOSIS — M4316 Spondylolisthesis, lumbar region: Secondary | ICD-10-CM | POA: Diagnosis not present

## 2023-10-29 DIAGNOSIS — M48062 Spinal stenosis, lumbar region with neurogenic claudication: Secondary | ICD-10-CM | POA: Diagnosis not present

## 2023-10-30 DIAGNOSIS — Z981 Arthrodesis status: Secondary | ICD-10-CM | POA: Diagnosis not present

## 2023-10-31 NOTE — Progress Notes (Signed)
 Cardiology Office Note:    Date:  11/01/2023   ID:  Nicholas Donnell Raddle., DOB [birth year], MRN [id number]  PCP:  Fernand Tracey LABOR, MD right   [facility] Providers Cardiologist:  Redell Leiter, MD Electrophysiologist:  Soyla Gladis Norton, MD     Referring MD: Fernand Tracey LABOR, MD   CC: follow up for tachycardia  History of Present Illness:    Nicholas Pruss. is a 73 y.o. male with a hx of CAD s/p CABG x 5, RBBB, congestive heart failure, paroxysmal atrial fibrillation s/p ablation, carotid artery stenosis, hypertension, DM2, CKD, hyperlipidemia w/ statin induced myopathy.  04/19/2023 atrial fibrillation ablation 03/26/2023 cardiac CT extensive pulmonary fibrosis 01/08/2023 echo EF 50 to 55%, global hypokinesis, trivial MR, mild thickening aortic valve 06/14/2022 monitor a flutter occurred continuously, rate was controlled 09/11/2021 echo EF 40 to 45%, mild concentric LVH, grade 3 DD, moderately elevated PASP, mild MR 07/18/2018 CABG x 5 07/16/2018 carotid Doppler right ICA mild stenosis, left ICA moderate stenosis  He initially established care with Dr. Leiter in 2020 following a hospitalization for a syncopal event, he underwent a Lexiscan  at that time revealed an EF of 28%, he underwent left heart cath on 07/15/2018 revealing multivessel CAD with recommendations for surgical intervention, he underwent CABG on 07/18/2023 with LIMA to LAD, SVG to PDA, SVG to diagonal, SVG to ramus intermedius and OM.  He developed atrial fibrillation with RVR in the postoperative setting requiring IV amiodarone  and eventually converted to sinus rhythm.  He has a history of statin myopathy.  Regarding his heart failure he had been previously unable to afford Entresto  and MRA had not been started secondary to hyperkalemia.  Evaluated by Dr. Leiter on 12/17/2022, he was maintaining sinus rhythm, encouraged to monitor his heart rate on his smart watch, coming plans for catheter ablation for his atrial fibrillation.   He underwent atrial fibrillation ablation on 04/19/2023.  Most recently he was evaluated by Dr. Norton on 07/22/2023, he was doing well, no further episodes of atrial fibrillation, advised to follow-up with EP in 12 months.  Most recently he was admitted to [facility] and underwent spinal surgery on 10/29/2023, surgery overall went well but apparently there was some issues with his oxygenation and he required supplementation, he also mentions that his heart rate was noted to be in the 30s or 50s and they discontinued his metoprolol  50 mg twice a day.  He presents today companied by his significant other, his heart rate is elevated but he is overall feeling okay, understandably dealing with pain as he just had back surgery. He denies chest pain, palpitations, dyspnea, pnd, orthopnea, n, v, dizziness, syncope, edema, weight gain, or early satiety.   Past Medical History:  Diagnosis Date   Abnormal myocardial perfusion study 07/11/2018   Acute systolic CHF (congestive heart failure) (HCC)    CAD (coronary artery disease) 08/04/2018   Cardiomyopathy (HCC) 07/11/2018   CKD (chronic kidney disease) 08/04/2018   Coronary artery disease    a. 07/15/18 cardiac cath multivessel CAD, CT surgery consulted b.    Diabetes mellitus without complication (HCC)    Dyslipidemia associated with type 2 diabetes mellitus (HCC) 07/11/2018   Hiatal hernia    Hx of CABG    a. 07/18/18 LIMA to LAD - SVG to diagonal 2 - SVG to Ramus and OM - SVG to PDA   Hyperlipidemia    Hypertension    NSTEMI (non-ST elevated myocardial infarction) (HCC) 07/11/2018   On amiodarone  therapy  08/04/2018   PAF (paroxysmal atrial fibrillation) (HCC)    a. briefly post op CABG 07/18/18 treated with amiodarone    Persistent atrial fibrillation (HCC) 05/24/2023   RBBB (right bundle branch block) 07/11/2018   Right bundle branch block    S/P CABG x 5 07/20/2018   Secondary hypercoagulable state (HCC) 05/24/2023   Statin myopathy 09/10/2022    Syncope    Syncope and collapse 07/11/2018    Past Surgical History:  Procedure Laterality Date   ATRIAL FIBRILLATION ABLATION N/A 04/19/2023   Procedure: ATRIAL FIBRILLATION ABLATION;  Surgeon: Inocencio Soyla Lunger, MD;  Location: [facility] INVASIVE CV LAB;  Service: Cardiovascular;  Laterality: N/A;   CARDIAC CATHETERIZATION     CARPAL TUNNEL RELEASE     CORONARY ARTERY BYPASS GRAFT N/A 07/18/2018   Procedure: CORONARY ARTERY BYPASS GRAFTING (CABG) X 5 USING LEFT INTERNAL MAMMARY ARTERY AND RIGHT SAPHENOUS VEIN HARVESTED ENDOSCOPICALLY;  Surgeon: Lucas Dorise POUR, MD;  Location: [facility] OR;  Service: Open Heart Surgery;  Laterality: N/A;   LEFT HEART CATH AND CORONARY ANGIOGRAPHY N/A 07/15/2018   Procedure: LEFT HEART CATH AND CORONARY ANGIOGRAPHY;  Surgeon: Dann Candyce RAMAN, MD;  Location: [facility] INVASIVE CV LAB;  Service: Cardiovascular;  Laterality: N/A;   TEE WITHOUT CARDIOVERSION N/A 07/18/2018   Procedure: TRANSESOPHAGEAL ECHOCARDIOGRAM (TEE);  Surgeon: Lucas Dorise POUR, MD;  Location: [facility] OR;  Service: Open Heart Surgery;  Laterality: N/A;   VASECTOMY      Current Medications: Current Meds  Medication Sig   apixaban  (ELIQUIS ) 5 MG TABS tablet Take 1 tablet (5 mg total) by mouth 2 (two) times daily.   Coenzyme Q10 (COQ10) 100 MG CAPS Take 100 mg by mouth daily.    empagliflozin  (JARDIANCE ) 25 MG TABS tablet Take 1 tablet (25 mg total) by mouth in the morning.for diabetes and heart.   ezetimibe  (ZETIA ) 10 MG tablet Take 1 tablet (10 mg total) by mouth daily.   HYDROcodone-acetaminophen  (NORCO/VICODIN) 5-325 MG tablet Take 1 tablet by mouth every 4 (four) hours as needed.   Melatonin 10 MG CAPS Take 10 mg by mouth at bedtime as needed (sleep).   metFORMIN  (GLUCOPHAGE -XR) 500 MG 24 hr tablet Take 2 tablets (1,000 mg total) by mouth 2 (two) times daily.   methocarbamol (ROBAXIN) 500 MG tablet Take 500 mg by mouth 3 (three) times daily as needed.   metoprolol  tartrate (LOPRESSOR ) 25 MG tablet Take 1  tablet (25 mg total) by mouth 2 (two) times daily.   Multiple Vitamin (MULTIVITAMIN WITH MINERALS) TABS tablet Take 2 tablets by mouth daily.   PSYLLIUM HUSK PO Take 3 capsules by mouth 2 (two) times daily.   tadalafil (CIALIS) 20 MG tablet Take 20 mg by mouth daily as needed for erectile dysfunction.   traMADol  (ULTRAM ) 50 MG tablet Take 50 mg by mouth daily as needed for moderate pain.   valsartan  (DIOVAN ) 40 MG tablet Take 1 tablet (40 mg total) by mouth 2 (two) times daily.   zolpidem (AMBIEN) 10 MG tablet Take 10 mg by mouth at bedtime as needed for sleep.   [DISCONTINUED] metoprolol  tartrate (LOPRESSOR ) 50 MG tablet Take 1 tablet (50 mg total) by mouth 2 (two) times daily.     Allergies:   Statins   Social History   Socioeconomic History   Marital status: Divorced    Spouse name: Not on file   Number of children: Not on file   Years of education: Not on file   Highest education level: Not on file  Occupational History   Not on file  Tobacco Use   Smoking status: Former    Passive exposure: Past   Smokeless tobacco: Never   Tobacco comments:    Former smoker 05/24/23  Vaping Use   Vaping status: Never Used  Substance and Sexual Activity   Alcohol use: Yes    Comment: beer occasionally   Drug use: Never   Sexual activity: Not on file  Other Topics Concern   Not on file  Social History Narrative   Not on file   Social Drivers of Health   Financial Resource Strain: Low Risk  (09/02/2023)   Received from [facility]   Overall Financial Resource Strain (CARDIA)    How hard is it for you to pay for the very basics like food, housing, medical care, and heating?: Not hard at all  Food Insecurity: No Food Insecurity (10/30/2023)   Received from [facility]   Hunger Vital Sign    Within the past 12 months, you worried that your food would run out before you got the money to buy more.: Never true    Within the past 12 months, the food you bought just didn't last and you  didn't have money to get more.: Never true  Transportation Needs: No Transportation Needs (10/30/2023)   Received from [facility] - Transportation    In the past 12 months, has lack of transportation kept you from medical appointments or from getting medications?: No    In the past 12 months, has lack of transportation kept you from meetings, work, or from getting things needed for daily living?: No  Physical Activity: Not on file  Stress: No Stress Concern Present (10/29/2023)   Received from [facility] - Occupational Stress Questionnaire    Do you feel stress - tense, restless, nervous, or anxious, or unable to sleep at night because your mind is troubled all the time - these days?: Not at all  Social Connections: Not on file     Family History: The patient's family history includes CAD in his mother; Macular degeneration in his mother; Obesity in his sister; Peripheral vascular disease in his father; Valvular heart disease in his mother.  ROS:   Please see the history of present illness.     All other systems reviewed and are negative.  EKGs/Labs/Other Studies Reviewed:    The following studies were reviewed today: Cardiac Studies & Procedures   ______________________________________________________________________________________________ CARDIAC CATHETERIZATION  CARDIAC CATHETERIZATION 07/15/2018  Conclusion  Ost LM lesion is 80% stenosed.  Prox Cx lesion is 80% stenosed.  Prox RCA lesion is 100% stenosed. Left to right collaterals.  Prox LAD to Mid LAD lesion is 25% stenosed.  2nd Diag lesion is 90% stenosed.  LV end diastolic pressure is normal.  There is no aortic valve stenosis.  Plan for surgery consult.  Move to stepdown.  Findings Coronary Findings Diagnostic  Dominance: Right  Left Main Ost LM lesion is 80% stenosed. The lesion is eccentric, irregular and ulcerative.  Left Anterior  Descending Prox LAD to Mid LAD lesion is 25% stenosed.  Second Diagonal Branch 2nd Diag lesion is 90% stenosed.  Left Circumflex Prox Cx lesion is 80% stenosed.  Right Coronary Artery Prox RCA lesion is 100% stenosed.  Right Posterior Descending Artery Collaterals RPDA filled by collaterals from 3rd Sept.  Intervention  No interventions have been documented.   STRESS TESTS  MYOCARDIAL PERFUSION IMAGING 07/10/2018  ECHOCARDIOGRAM  ECHOCARDIOGRAM COMPLETE 01/08/2023  Narrative ECHOCARDIOGRAM REPORT    Patient Name:   Nicholas Laski. Date of Exam: 01/08/2023 Medical Rec #:  [id number]         Height:       70.0 in Accession #:    [id number]        Weight:       177.6 lb Date of Birth:  1950-04-09         BSA:          1.985 m Patient Age:    72 years          BP:           114/72 mmHg Patient Gender: M                 HR:           78 bpm. 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 Previous Myocardial Infarction and CAD, Prior Cardiac Surgery, Arrythmias:Atrial Fibrillation, Signs/Symptoms:Syncope; Risk Factors:Hypertension and Diabetes.  Sonographer:    Lynwood Silvas RDCS Referring Phys: [id number] REDELL JINNY LEITER   Sonographer Comments: Technically challenging study due to limited acoustic windows. Global longitudinal strain was  attempted. IMPRESSIONS   1. Left ventricular ejection fraction, by estimation, is 50 to 55%. The left ventricle has low normal function. The left ventricle demonstrates global hypokinesis. Left ventricular diastolic parameters are indeterminate. 2. Right ventricular systolic function is normal. The right ventricular size is normal. There is normal pulmonary artery systolic pressure. The estimated right ventricular systolic pressure is 28.6 mmHg. 3. LA appears to be partially compressed from possibly extracardiac structure (?hiatal hernia). Correlate with other imaging. 4. The mitral valve is grossly normal. Trivial mitral valve regurgitation. No evidence of mitral stenosis. 5. The aortic valve is tricuspid. There is mild thickening of the aortic valve. Aortic valve regurgitation is not visualized. No aortic stenosis is present. 6. The inferior vena cava is normal in size with greater than 50% respiratory variability, suggesting right atrial pressure of 3 mmHg.  Comparison(s): A prior study was performed on 09/11/2021. LVEF was 40-45%, RV function was mildly reduced, Mild MR was reported. Extracardiac compression of left atrium was not prominent.  FINDINGS Left Ventricle: Left ventricular ejection fraction, by estimation, is 50 to 55%. The left ventricle has low normal function. The left ventricle demonstrates global hypokinesis. Definity  contrast agent was given IV to delineate the left ventricular endocardial borders. The left ventricular internal cavity size was normal in size. There is no left ventricular hypertrophy. Left ventricular diastolic parameters are indeterminate.  Right Ventricle: The right ventricular size is normal. Right vetricular wall thickness was not well visualized. Right ventricular systolic function is normal. There is normal pulmonary artery systolic pressure. The tricuspid regurgitant velocity is 2.53 m/s, and with an assumed right atrial pressure of 3 mmHg, the estimated  right ventricular systolic pressure is 28.6 mmHg.  Left Atrium: LA appears to be partially compressed from possibly extracardiac structure (?hiatal hernia). Correlate with other  imaging. Left atrial size was not well visualized.  Right Atrium: Right atrial size was normal in size.  Pericardium: There is no evidence of pericardial effusion.  Mitral Valve: The mitral valve is grossly normal. Trivial mitral valve regurgitation. No evidence of mitral valve stenosis.  Tricuspid Valve: The tricuspid valve is not well visualized. Tricuspid valve regurgitation is mild.  Aortic Valve: The aortic valve is tricuspid. There is mild thickening of the aortic valve. Aortic valve regurgitation is not visualized. No aortic stenosis is present.  Pulmonic Valve: The pulmonic valve was not well visualized. Pulmonic valve regurgitation is not visualized.  Aorta: The aortic root and ascending aorta are structurally normal, with no evidence of dilitation.  Venous: The inferior vena cava is normal in size with greater than 50% respiratory variability, suggesting right atrial pressure of 3 mmHg.  IAS/Shunts: The interatrial septum was not well visualized.   LEFT VENTRICLE PLAX 2D LVIDd:         4.10 cm     Diastology LVIDs:         3.40 cm     LV e' medial:    5.33 cm/s LV PW:         0.90 cm     LV E/e' medial:  14.4 LV IVS:        0.90 cm     LV e' lateral:   12.30 cm/s LVOT diam:     2.00 cm     LV E/e' lateral: 6.2 LV SV:         47 LV SV Index:   24 LVOT Area:     3.14 cm  LV Volumes (MOD) LV vol d, MOD A2C: 62.8 ml LV vol d, MOD A4C: 76.8 ml LV vol s, MOD A2C: 31.6 ml LV vol s, MOD A4C: 32.7 ml LV SV MOD A2C:     31.2 ml LV SV MOD A4C:     76.8 ml LV SV MOD BP:      38.3 ml  RIGHT VENTRICLE            IVC RV Basal diam:  3.00 cm    IVC diam: 1.20 cm RV S prime:     9.90 cm/s TAPSE (M-mode): 1.7 cm  LEFT ATRIUM             Index        RIGHT ATRIUM          Index LA diam:        1.60 cm  0.81 cm/m   RA Area:     8.35 cm LA Vol (A2C):   39.9 ml 20.11 ml/m  RA Volume:   12.30 ml 6.20 ml/m LA Vol (A4C):   91.9 ml 46.31 ml/m LA Biplane Vol: 60.1 ml 30.28 ml/m AORTIC VALVE LVOT Vmax:   81.00 cm/s LVOT Vmean:  52.167 cm/s LVOT VTI:    0.150 m  AORTA Ao Root diam: 3.60 cm Ao Asc diam:  3.40 cm  MV E velocity: 76.65 cm/s  TRICUSPID VALVE MV A velocity: 53.65 cm/s  TR Peak grad:   25.6 mmHg MV E/A ratio:  1.43        TR Vmax:        253.00 cm/s  SHUNTS Systemic VTI:  0.15 m Systemic Diam: 2.00 cm  Sreedhar reddy Madireddy Electronically signed by Alean reddy Madireddy Signature Date/Time: 01/08/2023/5:20:41 PM    Final   TEE  ECHO INTRAOPERATIVE TEE 07/18/2018  Interpretation Summary   Left ventricle:  Normal cavity size and wall thickness. Wall motion is abnormal.   Left atrium: Cavity is dilated and dilated.   Aortic valve: No stenosis.   Mitral valve:  Mild to moderate regurgitation.   Right ventricle:  Normal cavity size, wall thickness and ejection fraction.   Right atrium:  Cavity is dilated.   Tricuspid valve: Mild regurgitation.  MONITORS  LONG TERM MONITOR (3-14 DAYS) 08/29/2021  Narrative Patch Wear Time:  6 days and 9 hours (2023-06-27T08:34:16-0400 to 2023-07-03T18:13:14-0400)  Patient had a min HR of 57 bpm, max HR of 154 bpm, and avg HR of 77 bpm. Predominant underlying rhythm was Sinus Rhythm. First Degree AV Block was present. Bundle Branch Block/IVCD was present.  8 Supraventricular Tachycardia runs occurred, the run with the fastest interval lasting 5 beats with a max rate of 154 bpm, the longest lasting 8 beats with an avg rate of 101 bpm.  Isolated SVEs were occasional (1.6%, 11055), SVE Couplets were rare (<1.0%, 3033), and no SVE Triplets were present. There were no episodes of atrial fibrillation or flutter.  There were no episodes of sinus pauses secondary third-degree AV block or sinus node exit block.  Isolated VEs were  rare (<1.0%), VE Couplets were rare (<1.0%), and no VE Triplets were present.  There is 1 single triggered event sinus rhythm       ______________________________________________________________________________________________       EKG: Atrial flutter         Recent Labs: 03/22/2023: BUN 14; Creatinine, Ser 1.43; Hemoglobin 15.4; Platelets 171; Potassium 5.0; Sodium 136  Recent Lipid Panel    Component Value Date/Time   CHOL 99 (L) 09/24/2018 1426   TRIG 110 09/24/2018 1426   HDL 40 09/24/2018 1426   CHOLHDL 2.5 09/24/2018 1426   CHOLHDL 4.5 07/12/2018 0350   VLDL 74 (H) 07/12/2018 0350   LDLCALC 37 09/24/2018 1426     Risk Assessment/Calculations:    CHA2DS2-VASc Score = 5   This indicates a 7.2% annual risk of stroke. The patient's score is based upon: CHF History: 1 HTN History: 1 Diabetes History: 1 Stroke History: 0 Vascular Disease History: 1 Age Score: 1 Gender Score: 0               Physical Exam:    VS:  BP 130/70   Pulse (!) 104   Ht 5' 10 (1.778 m)   Wt 176 lb 9.6 oz (80.1 kg)   SpO2 94%   BMI 25.34 kg/m     Wt Readings from Last 3 Encounters:  11/01/23 176 lb 9.6 oz (80.1 kg)  07/22/23 170 lb 12.8 oz (77.5 kg)  05/24/23 168 lb 6.4 oz (76.4 kg)     GEN:  Well nourished, well developed in no acute distress HEENT: Normal NECK: No JVD; No carotid bruits LYMPHATICS: No lymphadenopathy CARDIAC: RRR, no murmurs, rubs, gallops RESPIRATORY:  Clear to auscultation without rales, wheezing or rhonchi  ABDOMEN: Soft, non-tender, non-distended MUSCULOSKELETAL: no edema; No deformity  SKIN: Warm and dry NEUROLOGIC:  Alert and oriented x 3 PSYCHIATRIC:  Normal affect   ASSESSMENT:    1. Coronary artery disease involving native coronary artery of native heart without angina pectoris   2. PAF (paroxysmal atrial fibrillation) (HCC)   3. RBBB (right bundle branch block)   4. Hypercoagulable state (HCC)   5. Ischemic cardiomyopathy   6.  Essential hypertension   7. Mixed hyperlipidemia       PLAN:    In order of problems listed above:  CAD-CABG x 5 in 2020, Stable with no anginal symptoms. No indication for ischemic evaluation.  Continue metoprolol  50 mg twice daily. On Eliquis  therefore he is not on aspirin .  Paroxysmal atrial fibrillation/atrial flutter/hypercoagulable state- s/p ablation in February of this year >> maintaining sinus rhythm and his heart rate is elevated, will restart metoprolol  but at a lower dose 25 mg twice daily, encouraged him to purchase a pulse ox or monitor his heart rate and let us  know if his heart rate continues to be elevated.  Continue Eliquis  5 mg twice daily-indication for dose reduction and he is not having any adverse bleeding events creatinine 0.91 hemoglobin 11.8 hematocrit 34.4 on 10/31/2023.  Ischemic cardiomyopathy/combined heart failure-NYHA class I, euvolemic.  Echo on 09/11/2021 revealed an EF of 40 to 45%, Jardiance  25 mg daily--although DM dosing, valsartan  40 mg twice daily.  Restart metoprolol  but will lower his dose to 25 mg twice daily.  Hyperlipidemia-has a history of statin induced myopathy, not clear who is monitoring his lipids currently.  Will evaluate at next visit.  Hypertension-blood pressure is controlled today 130/70 , continue valsartan  40 mg twice daily, restarting metoprolol  25 mg twice daily.  Right bundle branch block-chronic, noted initially in 2020  Disposition-start metoprolol  titrate 25 mg twice daily, keep recall with Dr. Monetta.   Medication Adjustments/Labs and Tests Ordered: Current medicines are reviewed at length with the patient today.  Concerns regarding medicines are outlined above.  No orders of the defined types were placed in this encounter.  Meds ordered this encounter  Medications   metoprolol  tartrate (LOPRESSOR ) 25 MG tablet    Sig: Take 1 tablet (25 mg total) by mouth 2 (two) times daily.    Dispense:  90 tablet    Refill:  3     Patient Instructions  Medication Instructions:  Your physician has recommended you make the following change in your medication:   START: Metoprolol  tartrate 25 mg two times daily  *If you need a refill on your cardiac medications before your next appointment, please call your pharmacy*  Lab Work: None If you have labs (blood work) drawn today and your tests are completely normal, you will receive your results only by: MyChart Message (if you have MyChart) OR A paper copy in the mail If you have any lab test that is abnormal or we need to change your treatment, we will call you to review the results.  Testing/Procedures: None  Follow-Up: At [facility], you and your health needs are our priority.  As part of our continuing mission to provide you with exceptional heart care, our providers are all part of one team.  This team includes your primary Cardiologist (physician) and Advanced Practice Providers or APPs (Physician Assistants and Nurse Practitioners) who all work together to provide you with the care you need, when you need it.  Your next appointment:   6 month(s)  Provider:   Redell Monetta, MD    We recommend signing up for the patient portal called MyChart.  Sign up information is provided on this After Visit Summary.  MyChart is used to connect with patients for Virtual Visits (Telemedicine).  Patients are able to view lab/test results, encounter notes, upcoming appointments, etc.  Non-urgent messages can be sent to your provider as well.   To learn more about what you can do with MyChart, go to [email or web address].   Other Instructions None           Signed, Nicholas JAYSON Hoover, NP  11/01/2023 9:38 AM    [facility]

## 2023-11-01 ENCOUNTER — Ambulatory Visit: Attending: Cardiology | Admitting: Cardiology

## 2023-11-01 ENCOUNTER — Encounter: Payer: Self-pay | Admitting: Cardiology

## 2023-11-01 ENCOUNTER — Other Ambulatory Visit (HOSPITAL_COMMUNITY): Payer: Self-pay

## 2023-11-01 VITALS — BP 130/70 | HR 104 | Ht 70.0 in | Wt 176.6 lb

## 2023-11-01 DIAGNOSIS — I255 Ischemic cardiomyopathy: Secondary | ICD-10-CM

## 2023-11-01 DIAGNOSIS — E782 Mixed hyperlipidemia: Secondary | ICD-10-CM

## 2023-11-01 DIAGNOSIS — I2510 Atherosclerotic heart disease of native coronary artery without angina pectoris: Secondary | ICD-10-CM | POA: Diagnosis not present

## 2023-11-01 DIAGNOSIS — I480 Paroxysmal atrial fibrillation: Secondary | ICD-10-CM | POA: Diagnosis not present

## 2023-11-01 DIAGNOSIS — I4510 Unspecified right bundle-branch block: Secondary | ICD-10-CM

## 2023-11-01 DIAGNOSIS — I10 Essential (primary) hypertension: Secondary | ICD-10-CM | POA: Diagnosis not present

## 2023-11-01 DIAGNOSIS — D6859 Other primary thrombophilia: Secondary | ICD-10-CM

## 2023-11-01 MED ORDER — METOPROLOL TARTRATE 25 MG PO TABS: 25.0000 mg | ORAL_TABLET | Freq: Two times a day (BID) | ORAL | 3 refills | Status: AC

## 2023-11-01 NOTE — Patient Instructions (Signed)
 Medication Instructions:  Your physician has recommended you make the following change in your medication:   START: Metoprolol  tartrate 25 mg two times daily  *If you need a refill on your cardiac medications before your next appointment, please call your pharmacy*  Lab Work: None If you have labs (blood work) drawn today and your tests are completely normal, you will receive your results only by: MyChart Message (if you have MyChart) OR A paper copy in the mail If you have any lab test that is abnormal or we need to change your treatment, we will call you to review the results.  Testing/Procedures: None  Follow-Up: At [facility], you and your health needs are our priority.  As part of our continuing mission to provide you with exceptional heart care, our providers are all part of one team.  This team includes your primary Cardiologist (physician) and Advanced Practice Providers or APPs (Physician Assistants and Nurse Practitioners) who all work together to provide you with the care you need, when you need it.  Your next appointment:   6 month(s)  Provider:   Redell Leiter, MD    We recommend signing up for the patient portal called MyChart.  Sign up information is provided on this After Visit Summary.  MyChart is used to connect with patients for Virtual Visits (Telemedicine).  Patients are able to view lab/test results, encounter notes, upcoming appointments, etc.  Non-urgent messages can be sent to your provider as well.   To learn more about what you can do with MyChart, go to [email or web address].   Other Instructions None

## 2023-11-04 ENCOUNTER — Other Ambulatory Visit (HOSPITAL_COMMUNITY): Payer: Self-pay

## 2023-11-04 DIAGNOSIS — E785 Hyperlipidemia, unspecified: Secondary | ICD-10-CM | POA: Diagnosis not present

## 2023-11-04 DIAGNOSIS — N401 Enlarged prostate with lower urinary tract symptoms: Secondary | ICD-10-CM | POA: Diagnosis not present

## 2023-11-04 DIAGNOSIS — Z981 Arthrodesis status: Secondary | ICD-10-CM | POA: Diagnosis not present

## 2023-11-04 DIAGNOSIS — E1169 Type 2 diabetes mellitus with other specified complication: Secondary | ICD-10-CM | POA: Diagnosis not present

## 2023-11-04 MED ORDER — PIOGLITAZONE HCL 30 MG PO TABS
30.0000 mg | ORAL_TABLET | Freq: Every morning | ORAL | 3 refills | Status: AC
  Filled 2023-11-04 – 2024-01-15 (×3): qty 90, 90d supply, fill #0

## 2023-11-04 MED ORDER — TAMSULOSIN HCL 0.4 MG PO CAPS
0.4000 mg | ORAL_CAPSULE | Freq: Every day | ORAL | 2 refills | Status: AC
  Filled 2023-11-04 – 2024-01-15 (×3): qty 90, 90d supply, fill #0

## 2023-11-05 ENCOUNTER — Other Ambulatory Visit: Payer: Self-pay

## 2023-11-05 ENCOUNTER — Other Ambulatory Visit (HOSPITAL_COMMUNITY): Payer: Self-pay

## 2023-11-06 ENCOUNTER — Other Ambulatory Visit: Payer: Self-pay

## 2023-11-11 DIAGNOSIS — G500 Trigeminal neuralgia: Secondary | ICD-10-CM | POA: Diagnosis not present

## 2023-11-12 ENCOUNTER — Other Ambulatory Visit (HOSPITAL_COMMUNITY): Payer: Self-pay

## 2023-11-12 MED ORDER — GABAPENTIN 100 MG PO CAPS
100.0000 mg | ORAL_CAPSULE | Freq: Every evening | ORAL | 2 refills | Status: AC | PRN
  Filled 2023-11-12 – 2023-11-13 (×3): qty 60, 30d supply, fill #0

## 2023-11-13 ENCOUNTER — Other Ambulatory Visit (HOSPITAL_COMMUNITY): Payer: Self-pay

## 2023-11-13 ENCOUNTER — Other Ambulatory Visit: Payer: Self-pay

## 2023-11-14 ENCOUNTER — Other Ambulatory Visit (HOSPITAL_COMMUNITY): Payer: Self-pay

## 2023-11-14 ENCOUNTER — Other Ambulatory Visit (HOSPITAL_BASED_OUTPATIENT_CLINIC_OR_DEPARTMENT_OTHER): Payer: Self-pay

## 2023-11-14 ENCOUNTER — Other Ambulatory Visit: Payer: Self-pay

## 2023-11-18 ENCOUNTER — Other Ambulatory Visit (HOSPITAL_COMMUNITY): Payer: Self-pay

## 2023-11-18 ENCOUNTER — Other Ambulatory Visit: Payer: Self-pay

## 2023-11-25 DIAGNOSIS — G500 Trigeminal neuralgia: Secondary | ICD-10-CM | POA: Diagnosis not present

## 2023-11-26 ENCOUNTER — Encounter: Payer: Self-pay | Admitting: Neurology

## 2023-11-27 DIAGNOSIS — M4326 Fusion of spine, lumbar region: Secondary | ICD-10-CM | POA: Diagnosis not present

## 2024-01-03 ENCOUNTER — Other Ambulatory Visit: Payer: Self-pay

## 2024-01-03 ENCOUNTER — Telehealth: Payer: Self-pay | Admitting: Cardiology

## 2024-01-03 NOTE — Telephone Encounter (Signed)
 Patient called the office and he was asking if he should keep taking Metoprolol  25 mg BID or if they should increase the dose. Patient had surgery back in September and his heart rate was in the 40's so the Metoprolol  was discontinued. At the office visit with Delon Hoover it was started again at 25 mg two times daily.  Patient was asked to provide a list of heart rates that we could send to the doctor to help decide about increasing Metoprolol . Patient did not have a list of blood pressures or heart rates to send to the doctor. Asked if the patient was symptomatic and he stated that he was not light headed or dizzy or tired. Since the patient was not symptomatic I asked him to check and record his heart rate and blood pressure for the next week and send us  a list. If the patient became symptomatic I asked them to call us  back. Patient verbalized understanding and had no further questions at this time.

## 2024-01-03 NOTE — Telephone Encounter (Signed)
 Left message for the patient to call back.

## 2024-01-03 NOTE — Telephone Encounter (Signed)
 Pt c/o medication issue:  1. Name of Medication: metoprolol  tartrate (LOPRESSOR ) 25 MG tablet   2. How are you currently taking this medication (dosage and times per day)? Half a pill twice daily   3. Are you having a reaction (difficulty breathing--STAT)? No   4. What is your medication issue? Pts would like to know if he should continue taking metoprolol  dosage as stated above, until 6 mo f/u. Pt also would like to make sure he is due back in six months from last appt, and not three. Please advise.

## 2024-01-15 ENCOUNTER — Other Ambulatory Visit (HOSPITAL_COMMUNITY): Payer: Self-pay

## 2024-01-15 ENCOUNTER — Other Ambulatory Visit: Payer: Self-pay

## 2024-01-21 DIAGNOSIS — E785 Hyperlipidemia, unspecified: Secondary | ICD-10-CM | POA: Diagnosis not present

## 2024-01-21 DIAGNOSIS — E1169 Type 2 diabetes mellitus with other specified complication: Secondary | ICD-10-CM | POA: Diagnosis not present

## 2024-01-22 DIAGNOSIS — M545 Low back pain, unspecified: Secondary | ICD-10-CM | POA: Diagnosis not present

## 2024-01-22 DIAGNOSIS — M4326 Fusion of spine, lumbar region: Secondary | ICD-10-CM | POA: Diagnosis not present

## 2024-01-22 DIAGNOSIS — M47816 Spondylosis without myelopathy or radiculopathy, lumbar region: Secondary | ICD-10-CM | POA: Diagnosis not present

## 2024-01-24 DIAGNOSIS — G500 Trigeminal neuralgia: Secondary | ICD-10-CM | POA: Diagnosis not present

## 2024-01-24 DIAGNOSIS — E782 Mixed hyperlipidemia: Secondary | ICD-10-CM | POA: Diagnosis not present

## 2024-01-24 DIAGNOSIS — L2989 Other pruritus: Secondary | ICD-10-CM | POA: Diagnosis not present

## 2024-01-24 DIAGNOSIS — I10 Essential (primary) hypertension: Secondary | ICD-10-CM | POA: Diagnosis not present

## 2024-01-24 DIAGNOSIS — E1169 Type 2 diabetes mellitus with other specified complication: Secondary | ICD-10-CM | POA: Diagnosis not present

## 2024-01-24 DIAGNOSIS — Z6825 Body mass index (BMI) 25.0-25.9, adult: Secondary | ICD-10-CM | POA: Diagnosis not present

## 2024-01-24 DIAGNOSIS — E785 Hyperlipidemia, unspecified: Secondary | ICD-10-CM | POA: Diagnosis not present

## 2024-01-24 DIAGNOSIS — I480 Paroxysmal atrial fibrillation: Secondary | ICD-10-CM | POA: Diagnosis not present

## 2024-01-25 ENCOUNTER — Other Ambulatory Visit (HOSPITAL_COMMUNITY): Payer: Self-pay

## 2024-02-03 ENCOUNTER — Other Ambulatory Visit (HOSPITAL_COMMUNITY): Payer: Self-pay

## 2024-02-05 ENCOUNTER — Other Ambulatory Visit: Payer: Self-pay | Admitting: Cardiology

## 2024-02-05 ENCOUNTER — Other Ambulatory Visit (HOSPITAL_COMMUNITY): Payer: Self-pay

## 2024-02-05 MED ORDER — ELIQUIS 5 MG PO TABS: 5.0000 mg | ORAL_TABLET | Freq: Two times a day (BID) | ORAL | 3 refills | Status: AC

## 2024-03-02 ENCOUNTER — Ambulatory Visit: Admitting: Neurology

## 2024-05-15 ENCOUNTER — Ambulatory Visit: Admitting: Cardiology
